# Patient Record
Sex: Female | Born: 1990 | Race: White | Hispanic: No | Marital: Single | Smoking: Never smoker
Health system: Southern US, Community
[De-identification: ages and names within clinical notes are randomized; demographics above are authoritative.]

## PROBLEM LIST (undated history)

## (undated) DIAGNOSIS — M1990 Unspecified osteoarthritis, unspecified site: Secondary | ICD-10-CM

## (undated) DIAGNOSIS — N809 Endometriosis, unspecified: Secondary | ICD-10-CM

## (undated) DIAGNOSIS — T7840XA Allergy, unspecified, initial encounter: Secondary | ICD-10-CM

## (undated) DIAGNOSIS — F32A Depression, unspecified: Secondary | ICD-10-CM

## (undated) DIAGNOSIS — J45909 Unspecified asthma, uncomplicated: Secondary | ICD-10-CM

## (undated) DIAGNOSIS — F419 Anxiety disorder, unspecified: Secondary | ICD-10-CM

## (undated) HISTORY — DX: Allergy, unspecified, initial encounter: T78.40XA

## (undated) HISTORY — DX: Depression, unspecified: F32.A

## (undated) HISTORY — DX: Endometriosis, unspecified: N80.9

## (undated) HISTORY — PX: CHOLECYSTECTOMY: SHX55

## (undated) HISTORY — PX: LAPAROSCOPY: SHX197

## (undated) HISTORY — DX: Unspecified asthma, uncomplicated: J45.909

## (undated) HISTORY — PX: TYMPANOSTOMY TUBE PLACEMENT: SHX32

## (undated) HISTORY — PX: TONSILLECTOMY AND ADENOIDECTOMY: SUR1326

## (undated) HISTORY — DX: Anxiety disorder, unspecified: F41.9

## (undated) HISTORY — DX: Unspecified osteoarthritis, unspecified site: M19.90

## (undated) HISTORY — PX: ABDOMINAL HYSTERECTOMY: SHX81

---

## 2020-01-20 ENCOUNTER — Telehealth: Payer: Self-pay

## 2020-01-20 NOTE — Telephone Encounter (Signed)
I can see her for an acute prior to her establish care appt if needed

## 2020-01-20 NOTE — Telephone Encounter (Signed)
Pt said she just moved from [place] last wk; pt has new pt appt with Tracy Hurry NP on 02/25/20. Pt is not established with OB GYN yet. Pt said since 12/23/19 pt started her menstrual cycle and has been bleeding since then; pt said now bleeding heavily and bleeds thru the absorbant pads very quickly. Pt said bleeding is bright red and passing clots also. Has lower abd pain and cramping below belly button and lower back hurts.  No dizziness, H/A and does not feel like will pass out but pt said she has to see someone. Pt said she is not on any med right now for endometriosis.  I advised pt to go to [facility] ED and pt is concerned about wait time so advised to go to [facility] walk in which is right beside [facility] ED entrance for eval and testing. Pt voiced understanding and will go to either [facility] or [facility] ED. Pt was appreciative and will keep new pt appt 02/25/20. FYI to Tracy Hurry NP.

## 2020-01-25 ENCOUNTER — Other Ambulatory Visit: Payer: Self-pay

## 2020-01-25 ENCOUNTER — Ambulatory Visit: Payer: 59 | Admitting: Internal Medicine

## 2020-01-25 ENCOUNTER — Encounter: Payer: Self-pay | Admitting: Internal Medicine

## 2020-01-25 VITALS — BP 122/84 | HR 82 | Temp 97.1°F | Wt 282.0 lb

## 2020-01-25 DIAGNOSIS — N920 Excessive and frequent menstruation with regular cycle: Secondary | ICD-10-CM

## 2020-01-25 LAB — FERRITIN: Ferritin: 20 ng/mL (ref 10.0–291.0)

## 2020-01-25 LAB — CBC
HCT: 40.1 % (ref 36.0–46.0)
Hemoglobin: 13.2 g/dL (ref 12.0–15.0)
MCHC: 32.8 g/dL (ref 30.0–36.0)
MCV: 83.9 fl (ref 78.0–100.0)
Platelets: 350 10*3/uL (ref 150.0–400.0)
RBC: 4.78 Mil/uL (ref 3.87–5.11)
RDW: 14.1 % (ref 11.5–15.5)
WBC: 9.6 10*3/uL (ref 4.0–10.5)

## 2020-01-25 LAB — IBC PANEL
Iron: 38 ug/dL — ABNORMAL LOW (ref 42–145)
Saturation Ratios: 10.3 % — ABNORMAL LOW (ref 20.0–50.0)
Transferrin: 263 mg/dL (ref 212.0–360.0)

## 2020-01-25 LAB — HEMOGLOBIN A1C: Hgb A1c MFr Bld: 5.4 % (ref 4.6–6.5)

## 2020-01-25 LAB — TSH: TSH: 4.24 u[IU]/mL (ref 0.35–4.50)

## 2020-01-25 NOTE — Patient Instructions (Signed)
Menorrhagia Menorrhagia is when your menstrual periods are heavy or last longer than normal. Follow these instructions at home: Medicines   Take over-the-counter and prescription medicines exactly as told by your doctor. This includes iron pills.  Do not change or switch medicines without asking your doctor.  Do not take aspirin or medicines that contain aspirin 1 week before or during your period. Aspirin may make bleeding worse. General instructions  If you need to change your pad or tampon more than once every 2 hours, limit your activity until the bleeding stops.  Iron pills can cause problems when pooping (constipation). To prevent or treat pooping problems while taking prescription iron pills, your doctor may suggest that you: ? Drink enough fluid to keep your pee (urine) clear or pale yellow. ? Take over-the-counter or prescription medicines. ? Eat foods that are high in fiber. These foods include:  Fresh fruits and vegetables.  Whole grains.  Beans. ? Limit foods that are high in fat and processed sugars. This includes fried and sweet foods.  Eat healthy meals and foods that are high in iron. Foods that have a lot of iron include: ? Leafy green vegetables. ? Meat. ? Liver. ? Eggs. ? Whole grain breads and cereals.  Do not try to lose weight until your heavy bleeding has stopped and you have normal amounts of iron in your blood. If you need to lose weight, work with your doctor.  Keep all follow-up visits as told by your doctor. This is important. Contact a doctor if:  You soak through a pad or tampon every 1 or 2 hours, and this happens every time you have a period.  You need to use pads and tampons at the same time because you are bleeding so much.  You are taking medicine and you: ? Feel sick to your stomach (nauseous). ? Throw up (vomit). ? Have watery poop (diarrhea).  You have other problems that may be related to the medicine you are taking. Get help  right away if:  You soak through more than a pad or tampon in 1 hour.  You pass clots bigger than 1 inch (2.5 cm) wide.  You feel short of breath.  You feel like your heart is beating too fast.  You feel dizzy or you pass out (faint).  You feel very weak or tired. Summary  Menorrhagia is when your menstrual periods are heavy or last longer than normal.  Take over-the-counter and prescription medicines exactly as told by your doctor. This includes iron pills.  Contact a doctor if you soak through more than a pad or tampon in 1 hour or are passing large clots. This information is not intended to replace advice given to you by your health care provider. Make sure you discuss any questions you have with your health care provider. Document Revised: 04/17/2017 Document Reviewed: 01/30/2016 Elsevier Patient Education  2020 Elsevier Inc.  

## 2020-01-25 NOTE — Telephone Encounter (Signed)
Called patient and scheduled acute visit for 1130 on 01/25/20
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
## 2020-01-27 ENCOUNTER — Encounter: Payer: Self-pay | Admitting: Internal Medicine

## 2020-01-27 MED ORDER — DIAZEPAM 10 MG PO TABS: ORAL_TABLET | ORAL | 0 refills | Status: DC

## 2020-01-27 NOTE — Telephone Encounter (Signed)
Patient is scheduled for Ultrasound on 02/01/20. Patient is aware of everything. I advised patient that I would let Nicki Reaper know and that patient will get mchart message from [facility] in regards to the medication to help and any directions for this.

## 2020-02-01 ENCOUNTER — Other Ambulatory Visit: Payer: Self-pay | Admitting: Internal Medicine

## 2020-02-01 ENCOUNTER — Ambulatory Visit
Admission: RE | Admit: 2020-02-01 | Discharge: 2020-02-01 | Disposition: A | Discharge status: Home / Self Care | Payer: 59 | Source: Ambulatory Visit | Attending: Internal Medicine | Admitting: Internal Medicine

## 2020-02-01 ENCOUNTER — Other Ambulatory Visit: Payer: Self-pay

## 2020-02-01 ENCOUNTER — Telehealth: Payer: Self-pay

## 2020-02-01 DIAGNOSIS — N920 Excessive and frequent menstruation with regular cycle: Secondary | ICD-10-CM | POA: Insufficient documentation

## 2020-02-01 NOTE — Telephone Encounter (Signed)
Tracy Medina from [facility] [name] department wanted to let us know that they attempted to do transvaginal US but were not able to. Patient was very anxious before any of the exam began, was shaking. Patient's mom was there but they could not get that portion done. She was able to get the transabdominal images and tried to get as good of a look as possible but just could not get anything else done. Just wanted to give Rene Kocher a heads up on this.

## 2020-02-02 ENCOUNTER — Encounter: Payer: Self-pay | Admitting: Internal Medicine

## 2020-02-02 NOTE — Telephone Encounter (Signed)
Noted, I was aware that this was a possibility.  We will review ultrasound interpretation once received.

## 2020-02-04 ENCOUNTER — Other Ambulatory Visit: Payer: Self-pay | Admitting: Internal Medicine

## 2020-02-04 MED ORDER — NORETHIN-ETH ESTRAD-FE BIPHAS 1 MG-10 MCG / 10 MCG PO TABS: 1.0000 | ORAL_TABLET | Freq: Every day | ORAL | 11 refills | Status: DC

## 2020-02-04 NOTE — Progress Notes (Signed)
Sent in Lo Loestrin FE. Start on the Sunday after her next period starts. Update me in 3 months and let me know how the bleeding is.

## 2020-02-25 ENCOUNTER — Ambulatory Visit (INDEPENDENT_AMBULATORY_CARE_PROVIDER_SITE_OTHER): Payer: 59 | Admitting: Internal Medicine

## 2020-02-25 ENCOUNTER — Encounter: Payer: Self-pay | Admitting: Internal Medicine

## 2020-02-25 ENCOUNTER — Other Ambulatory Visit: Payer: Self-pay

## 2020-02-25 DIAGNOSIS — J4520 Mild intermittent asthma, uncomplicated: Secondary | ICD-10-CM

## 2020-02-25 DIAGNOSIS — F32A Depression, unspecified: Secondary | ICD-10-CM

## 2020-02-25 DIAGNOSIS — F419 Anxiety disorder, unspecified: Secondary | ICD-10-CM | POA: Diagnosis not present

## 2020-02-25 DIAGNOSIS — M47816 Spondylosis without myelopathy or radiculopathy, lumbar region: Secondary | ICD-10-CM

## 2020-02-25 DIAGNOSIS — N921 Excessive and frequent menstruation with irregular cycle: Secondary | ICD-10-CM | POA: Diagnosis not present

## 2020-02-25 DIAGNOSIS — E611 Iron deficiency: Secondary | ICD-10-CM

## 2020-02-25 DIAGNOSIS — N809 Endometriosis, unspecified: Secondary | ICD-10-CM

## 2020-02-25 MED ORDER — TRAMADOL HCL 50 MG PO TABS: 50.0000 mg | ORAL_TABLET | Freq: Every day | ORAL | 0 refills | Status: AC | PRN

## 2020-02-25 MED ORDER — HYDROXYZINE HCL 25 MG PO TABS: 25.0000 mg | ORAL_TABLET | Freq: Every day | ORAL | 0 refills | Status: DC | PRN

## 2020-02-25 MED ORDER — FLUOXETINE HCL 10 MG PO CAPS: 10.0000 mg | ORAL_CAPSULE | Freq: Every day | ORAL | 2 refills | Status: DC
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
## 2020-02-29 ENCOUNTER — Encounter: Payer: Self-pay | Admitting: Internal Medicine

## 2020-02-29 DIAGNOSIS — N920 Excessive and frequent menstruation with regular cycle: Secondary | ICD-10-CM | POA: Insufficient documentation

## 2020-02-29 DIAGNOSIS — J45909 Unspecified asthma, uncomplicated: Secondary | ICD-10-CM | POA: Insufficient documentation

## 2020-02-29 DIAGNOSIS — M479 Spondylosis, unspecified: Secondary | ICD-10-CM | POA: Insufficient documentation

## 2020-02-29 DIAGNOSIS — F419 Anxiety disorder, unspecified: Secondary | ICD-10-CM | POA: Insufficient documentation

## 2020-02-29 DIAGNOSIS — N809 Endometriosis, unspecified: Secondary | ICD-10-CM | POA: Insufficient documentation

## 2020-02-29 DIAGNOSIS — E611 Iron deficiency: Secondary | ICD-10-CM | POA: Insufficient documentation

## 2020-02-29 DIAGNOSIS — F32A Depression, unspecified: Secondary | ICD-10-CM | POA: Insufficient documentation

## 2020-02-29 NOTE — Assessment & Plan Note (Signed)
We will trial oral contraception x3 months to see if any improvement in symptoms

## 2020-02-29 NOTE — Patient Instructions (Signed)

## 2020-02-29 NOTE — Assessment & Plan Note (Signed)
Encouraged core strengthening and stretching daily Encouraged exercise for weight loss

## 2020-02-29 NOTE — Assessment & Plan Note (Signed)
Secondary to menorrhagia We will trial 3 months of oral contraception If no improvement consider adding oral iron

## 2020-02-29 NOTE — Assessment & Plan Note (Signed)
Will see if there is any improvement with OCP's RX for Tramadol 50 mg daily prn as needed for severe pain

## 2020-02-29 NOTE — Assessment & Plan Note (Signed)
Persistent issues Rx for Fluoxetine 10 mg daily Rx for Hydroxyzine 25 mg daily as needed as needed for breakthrough anxiety Encouraged her to consider seeing a therapist for cognitive behavioral therapy Support offered  Update me in 1 month and let me know how you are doing

## 2020-02-29 NOTE — Assessment & Plan Note (Signed)
continue Albuterol and Nebulizer treatments as needed

## 2020-03-16 ENCOUNTER — Encounter: Payer: Self-pay | Admitting: Internal Medicine

## 2020-03-16 DIAGNOSIS — N809 Endometriosis, unspecified: Secondary | ICD-10-CM

## 2020-03-16 DIAGNOSIS — N921 Excessive and frequent menstruation with irregular cycle: Secondary | ICD-10-CM

## 2020-03-17 NOTE — Telephone Encounter (Signed)
Baity out of the office...Marland Kitchen please advise

## 2020-03-31 ENCOUNTER — Ambulatory Visit: Payer: 59 | Admitting: Obstetrics and Gynecology

## 2020-04-02 ENCOUNTER — Emergency Department
Admission: RE | Admit: 2020-04-02 | Discharge: 2020-04-02 | Disposition: A | Discharge status: Home / Self Care | Payer: 59 | Source: Ambulatory Visit | Attending: Family Medicine | Admitting: Family Medicine

## 2020-04-02 ENCOUNTER — Other Ambulatory Visit: Payer: Self-pay

## 2020-04-02 ENCOUNTER — Telehealth: Payer: Self-pay | Admitting: *Deleted

## 2020-04-02 VITALS — BP 114/74 | HR 98 | Temp 98.3°F | Resp 20 | Ht 67.0 in | Wt 255.0 lb

## 2020-04-02 DIAGNOSIS — J4521 Mild intermittent asthma with (acute) exacerbation: Secondary | ICD-10-CM

## 2020-04-02 DIAGNOSIS — J069 Acute upper respiratory infection, unspecified: Secondary | ICD-10-CM

## 2020-04-02 MED ORDER — METHYLPREDNISOLONE SODIUM SUCC 125 MG IJ SOLR
80.0000 mg | Freq: Once | INTRAMUSCULAR | Status: AC
  Administered 2020-04-02: 80 mg via INTRAMUSCULAR

## 2020-04-02 MED ORDER — PREDNISONE 20 MG PO TABS: 20.0000 mg | ORAL_TABLET | Freq: Two times a day (BID) | ORAL | 0 refills | Status: DC

## 2020-04-02 MED ORDER — BENZONATATE 200 MG PO CAPS: 200.0000 mg | ORAL_CAPSULE | Freq: Two times a day (BID) | ORAL | 0 refills | Status: DC | PRN

## 2020-04-02 MED ORDER — DM-GUAIFENESIN ER 30-600 MG PO TB12: 1.0000 | ORAL_TABLET | Freq: Two times a day (BID) | ORAL | 0 refills | Status: DC

## 2020-04-02 NOTE — ED Provider Notes (Signed)
[facility]    CSN: [id number] Arrival date & time: 04/02/20  1334      History   Chief Complaint Chief Complaint  Patient presents with  . Cough  . Sore Throat    HPI Tracy Medina is a 30 y.o. female.   HPI   Patient is here with headache cough shortness sore throat and nasal congestion since yesterday.  She states she has asthma.  She is starting to become quite short of breath.  Her asthma is not well controlled with her inhaler.  She feels like she needs a prednisone shot.  She does have a nebulizer but her fluid has expired.  She has been vaccinated against COVID.  She states that she did take a Covid test today that was negative  Past Medical History:  Diagnosis Date  . Anxiety   . Arthritis   . Asthma   . Depression   . Endometriosis     Patient Active Problem List   Diagnosis Date Noted  . Endometriosis 02/29/2020  . Menorrhagia 02/29/2020  . Iron deficiency 02/29/2020  . Anxiety and depression 02/29/2020  . Asthma 02/29/2020  . OA (osteoarthritis of the spine) 02/29/2020    Past Surgical History:  Procedure Laterality Date  . CHOLECYSTECTOMY    . LAPAROSCOPY    . TONSILLECTOMY AND ADENOIDECTOMY      OB History   No obstetric history on file.      Home Medications    Prior to Admission medications   Medication Sig Start Date End Date Taking? Authorizing Provider  albuterol (VENTOLIN HFA) 108 (90 Base) MCG/ACT inhaler Inhale 2 puffs into the lungs every 4 (four) hours as needed for wheezing or shortness of breath.   Yes [provider]  benzonatate (TESSALON) 200 MG capsule Take 1 capsule (200 mg total) by mouth 2 (two) times daily as needed for cough. 04/02/20  Yes Eustace Moore, MD  dextromethorphan-guaiFENesin [facility] DM) 30-600 MG 12hr tablet Take 1 tablet by mouth 2 (two) times daily. 04/02/20  Yes Eustace Moore, MD  predniSONE (DELTASONE) 20 MG tablet Take 1 tablet (20 mg total) by mouth 2 (two) times daily  with a meal. 04/02/20  Yes Eustace Moore, MD  FLUoxetine (PROZAC) 10 MG capsule Take 1 capsule (10 mg total) by mouth daily. 02/25/20   Lorre Munroe, NP  hydrOXYzine (ATARAX/VISTARIL) 25 MG tablet Take 1 tablet (25 mg total) by mouth daily as needed. 02/25/20   Lorre Munroe, NP  Norethindrone-Ethinyl Estradiol-Fe Biphas (LO LOESTRIN FE) 1 MG-10 MCG / 10 MCG tablet Take 1 tablet by mouth daily. 02/04/20   Lorre Munroe, NP    Family History Family History  Adopted: Yes  Problem Relation Age of Onset  . Hypertension Mother     Social History Social History   Tobacco Use  . Smoking status: Never Smoker  . Smokeless tobacco: Never Used  Substance Use Topics  . Alcohol use: Yes    Comment: social  . Drug use: Never     Allergies   Ceclor [cefaclor]   Review of Systems Review of Systems See HPI  Physical Exam Triage Vital Signs ED Triage Vitals  Enc Vitals Group     BP 04/02/20 1430 114/74     Pulse Rate 04/02/20 1430 98     Resp 04/02/20 1430 20     Temp 04/02/20 1430 98.3 F (36.8 C)     Temp Source 04/02/20 1430 Oral  SpO2 04/02/20 1430 98 %     Weight 04/02/20 1424 255 lb (115.7 kg)     Height 04/02/20 1424 5\' 7"  (1.702 m)     Head Circumference --      Peak Flow --      Pain Score 04/02/20 1424 5     Pain Loc --      Pain Edu? --      Excl. in GC? --    No data found.  Updated Vital Signs BP 114/74 (BP Location: Right Arm)   Pulse 98   Temp 98.3 F (36.8 C) (Oral)   Resp 20   Ht 5\' 7"  (1.702 m)   Wt 115.7 kg   LMP 03/22/2020   SpO2 98%   BMI 39.94 kg/m      Physical Exam Constitutional:      General: She is not in acute distress.    Appearance: She is well-developed. She is obese.  HENT:     Head: Normocephalic and atraumatic.     Right Ear: Tympanic membrane and ear canal normal.     Left Ear: Tympanic membrane and ear canal normal.     Nose: Congestion present.     Mouth/Throat:     Mouth: Mucous membranes are moist.      Tonsils: No tonsillar exudate or tonsillar abscesses.  Eyes:     Conjunctiva/sclera: Conjunctivae normal.     Pupils: Pupils are equal, round, and reactive to light.  Cardiovascular:     Rate and Rhythm: Normal rate and regular rhythm.     Heart sounds: Normal heart sounds.  Pulmonary:     Effort: Pulmonary effort is normal. No respiratory distress.     Breath sounds: Wheezing and rhonchi present.     Comments: Scattered inspiratory wheezes and rhonchi throughout lungs Abdominal:     General: There is no distension.     Palpations: Abdomen is soft.  Musculoskeletal:        General: Normal range of motion.     Cervical back: Normal range of motion.  Lymphadenopathy:     Cervical: No cervical adenopathy.  Skin:    General: Skin is warm and dry.  Neurological:     Mental Status: She is alert.  Psychiatric:        Behavior: Behavior normal.      UC Treatments / Results  Labs (all labs ordered are listed, but only abnormal results are displayed) Labs Reviewed - No data to display  EKG   Radiology No results found.  Procedures Procedures (including critical care time)  Medications Ordered in UC Medications  methylPREDNISolone sodium succinate (SOLU-MEDROL) 125 mg/2 mL injection 80 mg (80 mg Intramuscular Given 04/02/20 1516)    Initial Impression / Assessment and Plan / UC Course  I have reviewed the triage vital signs and the nursing notes.  Pertinent labs & imaging results that were available during my care of the patient were reviewed by me and considered in my medical decision making (see chart for details).     Viral upper respiratory infection with intermittent asthma.  Home care discussed Final Clinical Impressions(s) / UC Diagnoses   Final diagnoses:  Viral upper respiratory tract infection  Mild intermittent asthma with acute exacerbation     Discharge Instructions     Drink plenty of fluids Take Tessalon 2 times a day In addition take Mucinex DM  2 times a day Use the humidifier when able Take prednisone 2 times a day for 5 days  Continue inhalers as needed Return if you fail to improve   ED Prescriptions    Medication Sig Dispense Auth. Provider   predniSONE (DELTASONE) 20 MG tablet Take 1 tablet (20 mg total) by mouth 2 (two) times daily with a meal. 10 tablet Eustace Moore, MD   benzonatate (TESSALON) 200 MG capsule Take 1 capsule (200 mg total) by mouth 2 (two) times daily as needed for cough. 20 capsule Eustace Moore, MD   dextromethorphan-guaiFENesin [facility] DM) 30-600 MG 12hr tablet Take 1 tablet by mouth 2 (two) times daily. 20 tablet Eustace Moore, MD     PDMP not reviewed this encounter.   Eustace Moore, MD 04/02/20 [phone]

## 2020-04-02 NOTE — Telephone Encounter (Signed)
Pt has asthma, having difficulty with breathing while she is sick. Inhaler is expired & not as effective right now. Has a cough, wheezing, & difficulty breathing. Also has a runny nose. No fever. Sx's started about 24 hours ago.  I have advised pt to go to Urgent Care or ED. I also told her I would call her back once I had spoken with the on call provider.

## 2020-04-02 NOTE — Telephone Encounter (Signed)
Pt notified & agreeable to go to UC. She has already made an appt for the [facility] location.

## 2020-04-02 NOTE — Discharge Instructions (Addendum)
Drink plenty of fluids Take Tessalon 2 times a day In addition take Mucinex DM 2 times a day Use the humidifier when able Take prednisone 2 times a day for 5 days Continue inhalers as needed Return if you fail to improve

## 2020-04-02 NOTE — ED Triage Notes (Addendum)
Pt presents to Urgent Care with c/o cough, sore throat, and nasal congestion x 1 day. Pt reports she has asthma and her albuterol nebulizer solution has expired. She denies known COVID exposure and reports a negative home test today; has been vaccinated.

## 2020-04-02 NOTE — Telephone Encounter (Signed)
I think the patient should go to urgent care for an in person evaluation with those symptoms.

## 2020-04-04 ENCOUNTER — Telehealth: Payer: Self-pay

## 2020-04-04 NOTE — Telephone Encounter (Signed)
[facility] Night - Client TELEPHONE ADVICE RECORD AccessNurse Patient Name: Tracy Medina Gender: Female DOB: 1990-08-27 Age: 30 Y 11 M 15 D Return Phone Number: [phone] (Primary) Address: City/State/Zip: [place] [address] Client [facility] Night - Client Client Site [facility] - Night Physician Nicki Reaper - NP Contact Type Call Who Is Calling Patient / Member / Family / Caregiver Call Type Triage / Clinical Relationship To Patient Self Return Phone Number [phone] (Primary) Chief Complaint WHEEZING Reason for Call Symptomatic / Request for Health Information Initial Comment Caller (calling on behalf of her dtr) states her dtr has a really bad cold, she has a bad cough and wheezing. She is requesting albuterol refill for her nebulizer. Her inhaler is not working. Translation No Nurse Assessment Nurse: Noelle Penner, RN, [place] Derry Date/Time (Eastern Time): 04/02/2020 10:20:30 AM Confirm and document reason for call. If symptomatic, describe symptoms. ---Caller states SOB, wheezing and coughing. Wants Neb treatment rx. Using inhaler now. Does the patient have any new or worsening symptoms? ---Yes Will a triage be completed? ---Yes Related visit to physician within the last 2 weeks? ---No Does the PT have any chronic conditions? (i.e. diabetes, asthma, this includes High risk factors for pregnancy, etc.) ---Yes List chronic conditions. ---Asthma Is the patient pregnant or possibly pregnant? (Ask all females between the ages of 8-55) ---No Is this a behavioral health or substance abuse call? ---No Guidelines Guideline Title Affirmed Question Affirmed Notes Nurse Date/Time (Eastern Time) Asthma Attack [1] MODERATE asthma attack (e.g., SOB at rest, speaks in phrases, audible wheezes) AND [2] not resolved after 2 inhaler or nebulizer treatments given 20 minutes apart Noelle Penner, RN, [place] Derry 04/02/2020  10:21:37 AM Disp. Time Lamount Cohen Time) Disposition Final User 04/02/2020 10:17:55 AM Send to Urgent Queue Sivels, Lexis PLEASE NOTE: All timestamps contained within this report are represented as [place] Standard Time. CONFIDENTIALTY NOTICE: This fax transmission is intended only for the addressee. It contains information that is legally privileged, confidential or otherwise protected from use or disclosure. If you are not the intended recipient, you are strictly prohibited from reviewing, disclosing, copying using or disseminating any of this information or taking any action in reliance on or regarding this information. If you have received this fax in error, please notify us immediately by telephone so that we can arrange for its return to [name]. Phone: [phone], Toll-Free: [phone], Fax: [phone] Page: 2 of 2 Call Id: [id number] 04/02/2020 10:26:50 AM Go to ED Now (or PCP triage) Radonna Ricker, RN, [place] Derry Caller Disagree/Comply Comply Caller Understands Yes PreDisposition InappropriateToAsk Care Advice Given Per Guideline GO TO ED NOW (OR PCP TRIAGE): ASTHMA ATTACK - TREATMENT - QUICK-RELIEF MEDICINE: CARE ADVICE given per Asthma Attack (Adult) guideline. Referrals [facility]

## 2020-04-04 NOTE — Telephone Encounter (Signed)
Patient was seen at [facility] on 3/12. Please refer to note.

## 2020-04-04 NOTE — Telephone Encounter (Signed)
noted 

## 2020-04-06 ENCOUNTER — Encounter: Payer: Self-pay | Admitting: Internal Medicine

## 2020-04-06 DIAGNOSIS — Z9109 Other allergy status, other than to drugs and biological substances: Secondary | ICD-10-CM

## 2020-04-07 NOTE — Telephone Encounter (Signed)
pts mom calling because if pt tries to talk has coughing episodes. Pt seen [facility] in [place] on 04/02/20; pt received steroid shot which helped but after 04/04/20 no more benefit from steroid shot; pt finished the prednisone yesterday; pt taking mucinex,sudafed, tessalon perles. Pt feels miserable and has SOB with difficulty catching breath after coughing episodes, very runny nose where pt has to blow nose all the time; pt has consistent sharpe pain in both breast and S/T. Pt is not running fever. Pt has dry cough mostly but occasional prod cough and after coughing it is hard for pt to catch breath. pts mom said will take pt to UC in [place] now. Sending note to Pamala Hurry NP.

## 2020-04-08 ENCOUNTER — Telehealth: Payer: Self-pay | Admitting: Obstetrics and Gynecology

## 2020-04-08 ENCOUNTER — Encounter: Payer: Self-pay | Admitting: Obstetrics and Gynecology

## 2020-04-08 ENCOUNTER — Other Ambulatory Visit: Payer: Self-pay

## 2020-04-08 ENCOUNTER — Ambulatory Visit (INDEPENDENT_AMBULATORY_CARE_PROVIDER_SITE_OTHER): Payer: 59 | Admitting: Obstetrics and Gynecology

## 2020-04-08 VITALS — BP 128/80 | Ht 66.5 in | Wt 276.0 lb

## 2020-04-08 VITALS — BP 130/80 | Ht 66.5 in | Wt 276.0 lb

## 2020-04-08 DIAGNOSIS — Z30017 Encounter for initial prescription of implantable subdermal contraceptive: Secondary | ICD-10-CM

## 2020-04-08 DIAGNOSIS — N939 Abnormal uterine and vaginal bleeding, unspecified: Secondary | ICD-10-CM | POA: Diagnosis not present

## 2020-04-08 DIAGNOSIS — N809 Endometriosis, unspecified: Secondary | ICD-10-CM

## 2020-04-08 LAB — POCT URINE PREGNANCY: Preg Test, Ur: NEGATIVE

## 2020-04-08 MED ORDER — ALBUTEROL SULFATE HFA 108 (90 BASE) MCG/ACT IN AERS: 2.0000 | INHALATION_SPRAY | Freq: Four times a day (QID) | RESPIRATORY_TRACT | 0 refills | Status: DC | PRN

## 2020-04-08 MED ORDER — FLUTICASONE PROPIONATE HFA 44 MCG/ACT IN AERO
1.0000 | INHALATION_SPRAY | Freq: Two times a day (BID) | RESPIRATORY_TRACT | 0 refills | Status: DC
Start: 2020-04-08 — End: 2020-06-24

## 2020-04-08 NOTE — Progress Notes (Signed)
  Nexplanon Insertion  Patient given informed consent, signed copy in the chart, time out was performed. Pregnancy test was negative. Appropriate time out taken.  Patient's left arm was prepped and draped in the usual sterile fashion.. The ruler used to measure and mark insertion area.  Pt was prepped with betadine swab and then injected with 3 cc of 2% lidocaine with epinephrine. Nexplanon removed form packaging,  Device confirmed in needle, then inserted full length of needle and withdrawn per handbook instructions.  Pt insertion site covered with steri-strip and a bandage.   Minimal blood loss.  Pt tolerated the procedure well.   Adelene Idler MD, [facility], [facility] 04/08/2020 3:42 PM
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
## 2020-04-08 NOTE — Telephone Encounter (Signed)
Patient coming back at 3:10 for nexplanon insertion with CRS

## 2020-04-11 NOTE — Telephone Encounter (Signed)
Noted. Nexplanon rcvd/charged 04/08/20

## 2020-04-16 ENCOUNTER — Other Ambulatory Visit: Payer: Self-pay | Admitting: Internal Medicine

## 2020-04-18 ENCOUNTER — Other Ambulatory Visit: Payer: Self-pay | Admitting: Internal Medicine

## 2020-04-23 MED ORDER — LEVALBUTEROL HCL 0.31 MG/3ML IN NEBU: 1.0000 | INHALATION_SOLUTION | RESPIRATORY_TRACT | 1 refills | Status: DC | PRN

## 2020-04-23 MED ORDER — FLUOXETINE HCL 20 MG PO TABS
20.0000 mg | ORAL_TABLET | Freq: Every day | ORAL | 2 refills | Status: DC
Start: 2020-04-23 — End: 2020-05-16

## 2020-04-23 NOTE — Addendum Note (Signed)
Addended by: Lorre Munroe on: 04/23/2020 08:43 AM   Modules accepted: Orders

## 2020-05-15 ENCOUNTER — Other Ambulatory Visit: Payer: Self-pay | Admitting: Internal Medicine

## 2020-05-15 DIAGNOSIS — Z9109 Other allergy status, other than to drugs and biological substances: Secondary | ICD-10-CM

## 2020-05-16 NOTE — Telephone Encounter (Signed)
Has upcoming appt with Rene Kocher at [facility]

## 2020-05-24 ENCOUNTER — Encounter: Payer: Self-pay | Admitting: Internal Medicine

## 2020-05-24 ENCOUNTER — Ambulatory Visit: Payer: 59 | Admitting: Internal Medicine

## 2020-05-24 ENCOUNTER — Ambulatory Visit (INDEPENDENT_AMBULATORY_CARE_PROVIDER_SITE_OTHER): Payer: 59 | Admitting: Internal Medicine

## 2020-05-24 ENCOUNTER — Other Ambulatory Visit: Payer: Self-pay

## 2020-05-24 VITALS — BP 133/78 | HR 84 | Temp 97.3°F | Resp 18 | Ht 66.5 in | Wt 269.6 lb

## 2020-05-24 DIAGNOSIS — Z6841 Body Mass Index (BMI) 40.0 and over, adult: Secondary | ICD-10-CM

## 2020-05-24 DIAGNOSIS — Z23 Encounter for immunization: Secondary | ICD-10-CM | POA: Diagnosis not present

## 2020-05-24 DIAGNOSIS — W57XXXA Bitten or stung by nonvenomous insect and other nonvenomous arthropods, initial encounter: Secondary | ICD-10-CM

## 2020-05-24 DIAGNOSIS — F5104 Psychophysiologic insomnia: Secondary | ICD-10-CM

## 2020-05-24 DIAGNOSIS — Z0001 Encounter for general adult medical examination with abnormal findings: Secondary | ICD-10-CM

## 2020-05-24 DIAGNOSIS — G4700 Insomnia, unspecified: Secondary | ICD-10-CM | POA: Insufficient documentation

## 2020-05-24 DIAGNOSIS — Z1159 Encounter for screening for other viral diseases: Secondary | ICD-10-CM | POA: Diagnosis not present

## 2020-05-24 DIAGNOSIS — E66813 Obesity, class 3: Secondary | ICD-10-CM

## 2020-05-24 DIAGNOSIS — Z114 Encounter for screening for human immunodeficiency virus [HIV]: Secondary | ICD-10-CM

## 2020-05-24 DIAGNOSIS — R030 Elevated blood-pressure reading, without diagnosis of hypertension: Secondary | ICD-10-CM

## 2020-05-24 MED ORDER — TRAZODONE HCL 50 MG PO TABS
25.0000 mg | ORAL_TABLET | Freq: Every evening | ORAL | 0 refills | Status: DC | PRN
Start: 2020-05-24 — End: 2020-06-22

## 2020-05-24 NOTE — Assessment & Plan Note (Signed)
Borderline Reinforced DASH diet and exercise for weight loss 

## 2020-05-24 NOTE — Patient Instructions (Signed)
Health Maintenance, Female Adopting a healthy lifestyle and getting preventive care are important in promoting health and wellness. Ask your health care provider about:  The right schedule for you to have regular tests and exams.  Things you can do on your own to prevent diseases and keep yourself healthy. What should I know about diet, weight, and exercise? Eat a healthy diet  Eat a diet that includes plenty of vegetables, fruits, low-fat dairy products, and lean protein.  Do not eat a lot of foods that are high in solid fats, added sugars, or sodium.   Maintain a healthy weight Body mass index (BMI) is used to identify weight problems. It estimates body fat based on height and weight. Your health care provider can help determine your BMI and help you achieve or maintain a healthy weight. Get regular exercise Get regular exercise. This is one of the most important things you can do for your health. Most adults should:  Exercise for at least 150 minutes each week. The exercise should increase your heart rate and make you sweat (moderate-intensity exercise).  Do strengthening exercises at least twice a week. This is in addition to the moderate-intensity exercise.  Spend less time sitting. Even light physical activity can be beneficial. Watch cholesterol and blood lipids Have your blood tested for lipids and cholesterol at 30 years of age, then have this test every 5 years. Have your cholesterol levels checked more often if:  Your lipid or cholesterol levels are high.  You are older than 30 years of age.  You are at high risk for heart disease. What should I know about cancer screening? Depending on your health history and family history, you may need to have cancer screening at various ages. This may include screening for:  Breast cancer.  Cervical cancer.  Colorectal cancer.  Skin cancer.  Lung cancer. What should I know about heart disease, diabetes, and high blood  pressure? Blood pressure and heart disease  High blood pressure causes heart disease and increases the risk of stroke. This is more likely to develop in people who have high blood pressure readings, are of African descent, or are overweight.  Have your blood pressure checked: ? Every 3-5 years if you are 18-39 years of age. ? Every year if you are 40 years old or older. Diabetes Have regular diabetes screenings. This checks your fasting blood sugar level. Have the screening done:  Once every three years after age 40 if you are at a normal weight and have a low risk for diabetes.  More often and at a younger age if you are overweight or have a high risk for diabetes. What should I know about preventing infection? Hepatitis B If you have a higher risk for hepatitis B, you should be screened for this virus. Talk with your health care provider to find out if you are at risk for hepatitis B infection. Hepatitis C Testing is recommended for:  Everyone born from 1945 through 1965.  Anyone with known risk factors for hepatitis C. Sexually transmitted infections (STIs)  Get screened for STIs, including gonorrhea and chlamydia, if: ? You are sexually active and are younger than 30 years of age. ? You are older than 30 years of age and your health care provider tells you that you are at risk for this type of infection. ? Your sexual activity has changed since you were last screened, and you are at increased risk for chlamydia or gonorrhea. Ask your health care provider   if you are at risk.  Ask your health care provider about whether you are at high risk for HIV. Your health care provider may recommend a prescription medicine to help prevent HIV infection. If you choose to take medicine to prevent HIV, you should first get tested for HIV. You should then be tested every 3 months for as long as you are taking the medicine. Pregnancy  If you are about to stop having your period (premenopausal) and  you may become pregnant, seek counseling before you get pregnant.  Take 400 to 800 micrograms (mcg) of folic acid every day if you become pregnant.  Ask for birth control (contraception) if you want to prevent pregnancy. Osteoporosis and menopause Osteoporosis is a disease in which the bones lose minerals and strength with aging. This can result in bone fractures. If you are 65 years old or older, or if you are at risk for osteoporosis and fractures, ask your health care provider if you should:  Be screened for bone loss.  Take a calcium or vitamin D supplement to lower your risk of fractures.  Be given hormone replacement therapy (HRT) to treat symptoms of menopause. Follow these instructions at home: Lifestyle  Do not use any products that contain nicotine or tobacco, such as cigarettes, e-cigarettes, and chewing tobacco. If you need help quitting, ask your health care provider.  Do not use street drugs.  Do not share needles.  Ask your health care provider for help if you need support or information about quitting drugs. Alcohol use  Do not drink alcohol if: ? Your health care provider tells you not to drink. ? You are pregnant, may be pregnant, or are planning to become pregnant.  If you drink alcohol: ? Limit how much you use to 0-1 drink a day. ? Limit intake if you are breastfeeding.  Be aware of how much alcohol is in your drink. In the [place] one drink equals one 12 oz bottle of beer (355 mL), one 5 oz glass of wine (148 mL), or one 1 oz glass of hard liquor (44 mL). General instructions  Schedule regular health, dental, and eye exams.  Stay current with your vaccines.  Tell your health care provider if: ? You often feel depressed. ? You have ever been abused or do not feel safe at home. Summary  Adopting a healthy lifestyle and getting preventive care are important in promoting health and wellness.  Follow your health care provider's instructions about healthy  diet, exercising, and getting tested or screened for diseases.  Follow your health care provider's instructions on monitoring your cholesterol and blood pressure. This information is not intended to replace advice given to you by your health care provider. Make sure you discuss any questions you have with your health care provider. Document Revised: 01/01/2018 Document Reviewed: 01/01/2018 Elsevier Patient Education  2021 [employer]  

## 2020-05-24 NOTE — Progress Notes (Signed)
Subjective:    Patient ID: Tracy Medina, female    DOB: [birth year], 30 y.o.   MRN: [id number]  HPI  Patient presents the clinic today for her annual exam.  She is concerned about insomnia. She has difficulty falling asleep but is able to stay asleep. She reports the Fluoxetine initially helped her fall asleep but it is not as effective anymore.  She has tried taking Melatonin Gummies and Unisom but these are either not effective or make her too drowsy.  She would like to know if there are other alternatives to help her sleep.  Flu: 12/2019 Tetanus: >10 years COVID: Pfizer x3 Pap smear: 08/2018 Dentist: Annually  Diet: She does eat meat. She consumes fruits and veggies. She does eat fried foods. She drinks mostly Gatorade Zero Exercise: Walking  Review of Systems      Past Medical History:  Diagnosis Date  . Anxiety   . Arthritis   . Asthma   . Depression   . Endometriosis     Current Outpatient Medications  Medication Sig Dispense Refill  . albuterol (VENTOLIN HFA) 108 (90 Base) MCG/ACT inhaler Inhale 2 puffs into the lungs every 6 (six) hours as needed for wheezing or shortness of breath. 8 g 0  . azithromycin (ZITHROMAX) 250 MG tablet Take 250 mg by mouth as directed.    . benzonatate (TESSALON) 200 MG capsule Take 1 capsule (200 mg total) by mouth 2 (two) times daily as needed for cough. 20 capsule 0  . dextromethorphan-guaiFENesin (MUCINEX DM) 30-600 MG 12hr tablet Take 1 tablet by mouth 2 (two) times daily. 20 tablet 0  . etonogestrel (NEXPLANON) 68 MG IMPL implant 1 each by Subdermal route once.    Marland Kitchen FLUoxetine (PROZAC) 20 MG tablet TAKE 1 TABLET BY MOUTH EVERY DAY 90 tablet 0  . fluticasone (FLOVENT HFA) 44 MCG/ACT inhaler Inhale 1 puff into the lungs 2 (two) times daily. 1 each 0  . hydrOXYzine (ATARAX/VISTARIL) 25 MG tablet TAKE 1 TABLET BY MOUTH EVERY DAY AS NEEDED 90 tablet 0  . levalbuterol (XOPENEX) 0.31 MG/3ML nebulizer solution Take 3 mLs (0.31 mg total)  by nebulization every 4 (four) hours as needed for wheezing. 30 mL 1  . Norethindrone-Ethinyl Estradiol-Fe Biphas (LO LOESTRIN FE) 1 MG-10 MCG / 10 MCG tablet Take 1 tablet by mouth daily. 28 tablet 11  . predniSONE (DELTASONE) 20 MG tablet Take 1 tablet (20 mg total) by mouth 2 (two) times daily with a meal. 10 tablet 0  . traMADol (ULTRAM) 50 MG tablet Take by mouth.     No current facility-administered medications for this visit.    Allergies  Allergen Reactions  . Cefaclor Hives and Rash    Family History  Adopted: Yes  Problem Relation Age of Onset  . Hypertension Mother     Social History   Socioeconomic History  . Marital status: Single    Spouse name: Not on file  . Number of children: Not on file  . Years of education: Not on file  . Highest education level: Not on file  Occupational History  . Not on file  Tobacco Use  . Smoking status: Never Smoker  . Smokeless tobacco: Never Used  Vaping Use  . Vaping Use: Never used  Substance and Sexual Activity  . Alcohol use: Yes    Comment: social  . Drug use: Never  . Sexual activity: Not Currently    Birth control/protection: Pill  Other Topics Concern  . Not on file  Social History Narrative  . Not on file   Social Determinants of Health   Financial Resource Strain: Not on file  Food Insecurity: Not on file  Transportation Needs: Not on file  Physical Activity: Not on file  Stress: Not on file  Social Connections: Not on file  Intimate Partner Violence: Not on file     Constitutional: Denies fever, malaise, fatigue, headache or abrupt weight changes.  HEENT: Denies eye pain, eye redness, ear pain, ringing in the ears, wax buildup, runny nose, nasal congestion, bloody nose, or sore throat. Respiratory: Denies difficulty breathing, shortness of breath, cough or sputum production.   Cardiovascular: Denies chest pain, chest tightness, palpitations or swelling in the hands or feet.  Gastrointestinal: Denies  abdominal pain, bloating, constipation, diarrhea or blood in the stool.  GU: Denies urgency, frequency, pain with urination, burning sensation, blood in urine, odor or discharge. Musculoskeletal: Patient reports chronic back pain.  Denies decrease in range of motion, difficulty with gait, muscle pain or joint swelling.  Skin: Patient reports rash of the right wrist and right ankle denies ulcercations.  Neurological: Patient reports insomnia.  Denies dizziness, difficulty with memory, difficulty with speech or problems with balance and coordination.  Psych: Patient has a history of anxiety and depression. Denies SI/HI.  No other specific complaints in a complete review of systems (except as listed in HPI above).  Objective:   Physical Exam  BP 133/78 (BP Location: Right Arm, Patient Position: Sitting, Cuff Size: Large)   Pulse 84   Temp (!) 97.3 F (36.3 C) (Temporal)   Resp 18   Ht 5' 6.5" (1.689 m)   Wt 269 lb 9.6 oz (122.3 kg)   SpO2 98%   BMI 42.86 kg/m   Wt Readings from Last 3 Encounters:  04/08/20 276 lb (125.2 kg)  04/08/20 276 lb (125.2 kg)  04/02/20 255 lb (115.7 kg)    General: Appears her stated age, obese, in NAD. Skin: Warm, dry and intact.  Fleabites noted over right wrist and right ankle, healing noted. HEENT: Head: normal shape and size; Eyes: sclera white and EOMs intact;  Neck:  Neck supple, trachea midline. No masses, lumps or thyromegaly present.  Cardiovascular: Normal rate and rhythm. S1,S2 noted.  No murmur, rubs or gallops noted. No JVD or BLE edema. Pulmonary/Chest: Normal effort and positive vesicular breath sounds. No respiratory distress. No wheezes, rales or ronchi noted.  Abdomen: Soft and nontender. Normal bowel sounds. No distention or masses noted. Liver, spleen and kidneys non palpable. Musculoskeletal: Strength 5/5 BUE/BLE.  No difficulty with gait.  Neurological: Alert and oriented. Cranial nerves II-XII grossly intact. Coordination normal.   Psychiatric: Mood and affect normal.  Mildly anxious appearing.  Judgment and thought content normal.   CBC    Component Value Date/Time   WBC 9.6 01/25/2020 1144   RBC 4.78 01/25/2020 1144   HGB 13.2 01/25/2020 1144   HCT 40.1 01/25/2020 1144   PLT 350.0 01/25/2020 1144   MCV 83.9 01/25/2020 1144   MCHC 32.8 01/25/2020 1144   RDW 14.1 01/25/2020 1144    Hgb A1C Lab Results  Component Value Date   HGBA1C 5.4 01/25/2020           Assessment & Plan:   Preventative health maintenance:  Encouraged her to get flu shot in fall Tdap today COVID UTD, she will attach a copy of her card to my chart Pap smear due 08/2021, follows with GYN Encouraged her to consume a balanced diet and  exercise regimen Advised her to see an eye doctor and dentist annually We will check c-Met, lipid, HIV and hep C today  Flea Bites:  Can use Hydrocortisone OTC as needed  RTC in 1 year, sooner if needed Webb Silversmith, NP This visit occurred during the SARS-CoV-2 public health emergency.  Safety protocols were in place, including screening questions prior to the visit, additional usage of staff PPE, and extensive cleaning of exam room while observing appropriate contact time as indicated for disinfecting solutions.

## 2020-05-24 NOTE — Assessment & Plan Note (Signed)
We will trial Trazodone Advised her not to take this with Unisom or Melatonin Gummies

## 2020-05-25 LAB — LIPID PANEL
Cholesterol: 160 mg/dL (ref <200)
HDL: 48 mg/dL — ABNORMAL LOW (ref >=50)
LDL Cholesterol (Calc): 95 mg/dL (calc)
Non-HDL Cholesterol (Calc): 112 mg/dL (calc) (ref <130)
Total CHOL/HDL Ratio: 3.3 (calc) (ref <5.0)
Triglycerides: 77 mg/dL (ref <150)

## 2020-05-25 LAB — COMPREHENSIVE METABOLIC PANEL
AG Ratio: 1.7 (calc) (ref 1.0–2.5)
ALT: 13 U/L (ref 6–29)
AST: 14 U/L (ref 10–30)
Albumin: 4.5 g/dL (ref 3.6–5.1)
Alkaline phosphatase (APISO): 86 U/L (ref 31–125)
BUN: 9 mg/dL (ref 7–25)
CO2: 24 mmol/L (ref 20–32)
Calcium: 9.8 mg/dL (ref 8.6–10.2)
Chloride: 104 mmol/L (ref 98–110)
Creat: 0.97 mg/dL (ref 0.50–1.10)
Globulin: 2.6 g/dL (calc) (ref 1.9–3.7)
Glucose, Bld: 88 mg/dL (ref 65–99)
Potassium: 4.2 mmol/L (ref 3.5–5.3)
Sodium: 138 mmol/L (ref 135–146)
Total Bilirubin: 0.5 mg/dL (ref 0.2–1.2)
Total Protein: 7.1 g/dL (ref 6.1–8.1)

## 2020-05-25 LAB — HEPATITIS C ANTIBODY
Hepatitis C Ab: NONREACTIVE
SIGNAL TO CUT-OFF: 0 (ref <1.00)

## 2020-05-25 LAB — HIV ANTIBODY (ROUTINE TESTING W REFLEX): HIV 1&2 Ab, 4th Generation: NONREACTIVE

## 2020-06-15 ENCOUNTER — Other Ambulatory Visit: Payer: Self-pay

## 2020-06-17 ENCOUNTER — Encounter: Payer: Self-pay | Admitting: Allergy

## 2020-06-17 ENCOUNTER — Ambulatory Visit (INDEPENDENT_AMBULATORY_CARE_PROVIDER_SITE_OTHER): Payer: 59 | Admitting: Allergy

## 2020-06-17 ENCOUNTER — Other Ambulatory Visit: Payer: Self-pay

## 2020-06-17 ENCOUNTER — Telehealth: Payer: Self-pay

## 2020-06-17 VITALS — BP 122/82 | HR 70 | Temp 98.0°F | Resp 18 | Ht 67.0 in | Wt 271.4 lb

## 2020-06-17 DIAGNOSIS — J3089 Other allergic rhinitis: Secondary | ICD-10-CM

## 2020-06-17 DIAGNOSIS — H1013 Acute atopic conjunctivitis, bilateral: Secondary | ICD-10-CM

## 2020-06-17 DIAGNOSIS — L563 Solar urticaria: Secondary | ICD-10-CM | POA: Diagnosis not present

## 2020-06-17 DIAGNOSIS — J4540 Moderate persistent asthma, uncomplicated: Secondary | ICD-10-CM | POA: Diagnosis not present

## 2020-06-17 MED ORDER — OLOPATADINE HCL 0.2 % OP SOLN: 1.0000 [drp] | Freq: Every day | OPHTHALMIC | 5 refills | Status: DC | PRN

## 2020-06-17 MED ORDER — AZELASTINE HCL 0.1 % NA SOLN: NASAL | 5 refills | Status: DC

## 2020-06-17 MED ORDER — AZELASTINE-FLUTICASONE 137-50 MCG/ACT NA SUSP: 1.0000 | Freq: Two times a day (BID) | NASAL | 5 refills | Status: DC

## 2020-06-17 MED ORDER — FLUTICASONE PROPIONATE 50 MCG/ACT NA SUSP
NASAL | 5 refills | Status: DC
Start: 2020-06-17 — End: 2020-12-22

## 2020-06-17 NOTE — Progress Notes (Signed)
New Patient Note  RE: Tracy Medina MRN: [id number] DOB: April 08, 1990 Date of Office Visit: 06/17/2020  Referring provider: Lorre Munroe, NP Primary care provider: Lorre Munroe, NP  Chief Complaint: asthma and allergies  History of present illness: Tracy Medina is a 30 y.o. female presenting today for consultation for allergic rhinitis and asthma.    She moved to this area in December from [place].  She is noting more allergy symptoms here in [place].  She notes coughing and feels like her throat tightness and throat clearing, nasal congestion and drainage, itchy/watery eyes, sneezing.  Symptoms started this spring.  Has not had any sinus infections since living here.  She will take zyrtec or claritin that usually works but not lately.  Has not used nasal sprays or eyedrops.  She has asthma from childhood.  Her allergies flare her asthma.  She has cough, wheeze, shortness of breath and chest tightness when her asthma flares.  She states these symptoms are a lot worse when she has an respiratory illness.  She had an illness in March where she needed to go ED.    She states nebulizer treatment with levabulterol worked really well for her.   She has Flovent that she uses 1-2 puffs and uses as needed.  She does note have a spacer.  She states moving here she has been in the ED 3-4 times for asthma exacerbation.  She states she is using albuterol or levalbuterol about every other day at this time.   She has an albuterol inhaler. She states her pharmacy did not have albuterol neb solution but she was able to call around and found a pharmacy who could give her albuterol neb solution.  She states when she was younger she would develop a rash on her hands when she is in the sun that was hive-like and itchy.  She states it doesn't happen too often at this time.  She had a biopsy of this when she was younger and states the results were normal.    No history of food allergy.   She  reports developing hives with Ceclor.    Review of systems: Review of Systems  Constitutional: Negative.   HENT:       See HPI  Eyes:       See HPI  Respiratory:       See HPI  Cardiovascular: Negative.   Gastrointestinal: Negative.   Musculoskeletal: Negative.   Skin: Negative.   Neurological: Negative.     All other systems negative unless noted above in HPI  Past medical history: Past Medical History:  Diagnosis Date  . Anxiety   . Arthritis   . Asthma   . Depression   . Endometriosis     Past surgical history: Past Surgical History:  Procedure Laterality Date  . CHOLECYSTECTOMY    . LAPAROSCOPY    . TONSILLECTOMY AND ADENOIDECTOMY    . TYMPANOSTOMY TUBE PLACEMENT      Family history:  Family History  Adopted: Yes  Problem Relation Age of Onset  . Hypertension Mother     Social history: Lives in an apartment without carpeting with electric heating and central cooling.  Dog in the home.  There is no concern for water damage, mildew or roaches in the home.  She does not list an occupation at this time.  She denies a smoking history.  Medication List: Current Outpatient Medications  Medication Sig Dispense Refill  . albuterol (VENTOLIN HFA) 108 (90  Base) MCG/ACT inhaler Inhale 2 puffs into the lungs every 6 (six) hours as needed for wheezing or shortness of breath. 8 g 0  . dextromethorphan-guaiFENesin (MUCINEX DM) 30-600 MG 12hr tablet Take 1 tablet by mouth 2 (two) times daily. 20 tablet 0  . fluticasone (FLOVENT HFA) 44 MCG/ACT inhaler Inhale 1 puff into the lungs 2 (two) times daily. (Patient taking differently: Inhale 1 puff into the lungs 2 (two) times daily as needed.) 1 each 0  . hydrOXYzine (ATARAX/VISTARIL) 25 MG tablet TAKE 1 TABLET BY MOUTH EVERY DAY AS NEEDED 90 tablet 0  . levalbuterol (XOPENEX) 0.31 MG/3ML nebulizer solution Take 3 mLs (0.31 mg total) by nebulization every 4 (four) hours as needed for wheezing. 30 mL 1  . Norethindrone-Ethinyl  Estradiol-Fe Biphas (LO LOESTRIN FE) 1 MG-10 MCG / 10 MCG tablet Take 1 tablet by mouth daily. 28 tablet 11  . traZODone (DESYREL) 50 MG tablet Take 0.5-1 tablets (25-50 mg total) by mouth at bedtime as needed for sleep. 30 tablet 0  . etonogestrel (NEXPLANON) 68 MG IMPL implant 1 each by Subdermal route once.     No current facility-administered medications for this visit.    Known medication allergies: Allergies  Allergen Reactions  . Cefaclor Hives and Rash     Physical examination: Blood pressure 122/82, pulse 70, temperature 98 F (36.7 C), resp. rate 18, height 5\' 7"  (1.702 m), weight 271 lb 6.4 oz (123.1 kg), SpO2 98 %.  General: Alert, interactive, in no acute distress. HEENT: PERRLA, TMs pearly gray, turbinates moderately edematous without discharge, post-pharynx non erythematous. Neck: Supple without lymphadenopathy. Lungs: Clear to auscultation without wheezing, rhonchi or rales. {no increased work of breathing. CV: Normal S1, S2 without murmurs. Abdomen: Nondistended, nontender. Skin: Warm and dry, without lesions or rashes. Extremities:  No clubbing, cyanosis or edema. Neuro:   Grossly intact.  Diagnositics/Labs:  Spirometry: FEV1: 3.12 L 90%, FVC: 3.97L 96%, ratio consistent with  nonobstructive pattern  Allergy testing: Environmental allergy skin prick testing is positive to curvularia.  Intradermal testing is positive to cockroach and dog. Allergy testing results were read and interpreted by provider, documented by clinical staff.   Assessment and plan: Allergic rhinitis with conjunctivitis.  - Environmental allergy testing is positive to mold, cockroach and dog.   This however does not quite explain your seasonal symptoms thus will obtain serum IgE levels see if you have any pollen sensitivity.   -Allergen avoidance measures discussed/handouts provided -Try either Xyzal 5 mg or Allegra 180 mg 1 tablet daily.  These are over-the-counter long-acting  antihistamine options that may work better for symptom control than Allegra or Claritin -trial dymista 1 spray each nostril twice a day.  This is a combination nasal spray with Flonase + Astelin (nasal antihistamine).  This helps with both nasal congestion and drainage.  -For itchy, watery, red eyes use olopatadine 0.1% ointment to each eye daily as needed  Moderate persistent asthma -Use Flovent 44 mcg 1 puff twice daily at this time.  If you are not meeting the below goals then twice a day. -have access to albuterol inhaler 2 puffs or levalbuterol 1 vial via nebulizer every 4-6 hours as needed for cough/wheeze/shortness of breath/chest tightness.  May use 15-20 minutes prior to activity.   Monitor frequency of use.    Asthma control goals:   Full participation in all desired activities (may need albuterol before activity)  Albuterol use two time or less a week on average (not counting use with activity)  Cough interfering with sleep two time or less a month  Oral steroids no more than once a year  No hospitalizations  Solar urticaria -for rash with sun exposure can take additional dose of your antihistamine for itch control -let us know if this becomes more frequent and can recommend a topical option  Follow-up in 4 months or sooner if needed  I appreciate the opportunity to take part in Nikeya's care. Please do not hesitate to contact me with questions.  Sincerely,   Margo Aye, MD Allergy/Immunology [facility]

## 2020-06-17 NOTE — Patient Instructions (Addendum)
-   Environmental allergy testing is positive to mold, cockroach and dog.   This however does not quite explain your seasonal symptoms thus will obtain serum IgE levels see if you have any pollen sensitivity.   -Allergen avoidance measures discussed/handouts provided -Try either Xyzal 5 mg or Allegra 180 mg 1 tablet daily.  These are over-the-counter long-acting antihistamine options that may work better for symptom control than Allegra or Claritin -trial dymista 1 spray each nostril twice a day.  This is a combination nasal spray with Flonase + Astelin (nasal antihistamine).  This helps with both nasal congestion and drainage.  -For itchy, watery, red eyes use olopatadine 0.1% ointment to each eye daily as needed  -Use Flovent 44 mcg 1 puff twice daily at this time.  If you are not meeting the below goals then twice a day. -have access to albuterol inhaler 2 puffs or levalbuterol 1 vial via nebulizer every 4-6 hours as needed for cough/wheeze/shortness of breath/chest tightness.  May use 15-20 minutes prior to activity.   Monitor frequency of use.    Asthma control goals:   Full participation in all desired activities (may need albuterol before activity)  Albuterol use two time or less a week on average (not counting use with activity)  Cough interfering with sleep two time or less a month  Oral steroids no more than once a year  No hospitalizations  -for rash with sun exposure can take additional dose of your antihistamine for itch control -let us know if this becomes more frequent and can recommend a topical option  Follow-up in 4 months or sooner if needed

## 2020-06-17 NOTE — Telephone Encounter (Signed)
Called patient and let her know that her insurance did not want to cover the generic Dymista, so we are going to send in Azelastine ( 2 sprays per nostril twice daily) and Fluticasone ( 2 sprays per nostril once daily) to her pharmacy.  Patient voiced understanding.

## 2020-06-22 ENCOUNTER — Other Ambulatory Visit: Payer: Self-pay

## 2020-06-22 ENCOUNTER — Encounter: Payer: Self-pay | Admitting: Internal Medicine

## 2020-06-22 ENCOUNTER — Telehealth: Payer: Self-pay | Admitting: Allergy

## 2020-06-22 ENCOUNTER — Ambulatory Visit: Payer: Self-pay

## 2020-06-22 MED ORDER — TRAZODONE HCL 50 MG PO TABS: 25.0000 mg | ORAL_TABLET | Freq: Every evening | ORAL | 0 refills | Status: DC | PRN

## 2020-06-22 NOTE — Telephone Encounter (Signed)
Patient was in the ER today with an ear infection, which has been treated. The doctor told her it was due to allergies, but she was only positive to dog, cockroach, and mold when she was tested in our office. Patient states she has not heard back about her lab work she done in office last week, but labs still say active in her chart.  Patient would like to know what to do or if more allergy testing should be done.   Please advise.

## 2020-06-22 NOTE — Telephone Encounter (Signed)
Will check with RaChelle tomorrow to look into blood work.

## 2020-06-22 NOTE — Telephone Encounter (Signed)
Pt. Started having cough,sore throat last Friday. Last started having right ear pain and swollen lymph nodes. No availability in the practice this week. Pt. Will go to [facility].  Reason for Disposition . Earache  (Exceptions: brief ear pain of < 60 minutes duration, earache occurring during air travel  Answer Assessment - Initial Assessment Questions 1. LOCATION: "Which ear is involved?"     Right ear 2. ONSET: "When did the ear start hurting"      Friday 3. SEVERITY: "How bad is the pain?"  (Scale 1-10; mild, moderate or severe)   - MILD (1-3): doesn't interfere with normal activities    - MODERATE (4-7): interferes with normal activities or awakens from sleep    - SEVERE (8-10): excruciating pain, unable to do any normal activities      Now - 5 4. URI SYMPTOMS: "Do you have a runny nose or cough?"     Yes 5. FEVER: "Do you have a fever?" If Yes, ask: "What is your temperature, how was it measured, and when did it start?"     No 6. CAUSE: "Have you been swimming recently?", "How often do you use Q-TIPS?", "Have you had any recent air travel or scuba diving?"     No 7. OTHER SYMPTOMS: "Do you have any other symptoms?" (e.g., headache, stiff neck, dizziness, vomiting, runny nose, decreased hearing)     Swollen lymph nodes,nasal congestion,cough 8. PREGNANCY: "Is there any chance you are pregnant?" "When was your last menstrual period?"     No  Protocols used: EARACHE-A-AH

## 2020-06-22 NOTE — Telephone Encounter (Signed)
I could have added her on

## 2020-06-24 ENCOUNTER — Encounter: Payer: Self-pay | Admitting: Internal Medicine

## 2020-06-24 ENCOUNTER — Telehealth (INDEPENDENT_AMBULATORY_CARE_PROVIDER_SITE_OTHER): Payer: 59 | Admitting: Internal Medicine

## 2020-06-24 ENCOUNTER — Other Ambulatory Visit: Payer: Self-pay

## 2020-06-24 DIAGNOSIS — T7840XD Allergy, unspecified, subsequent encounter: Secondary | ICD-10-CM

## 2020-06-24 DIAGNOSIS — J4530 Mild persistent asthma, uncomplicated: Secondary | ICD-10-CM

## 2020-06-24 DIAGNOSIS — H60392 Other infective otitis externa, left ear: Secondary | ICD-10-CM | POA: Diagnosis not present

## 2020-06-24 DIAGNOSIS — H66011 Acute suppurative otitis media with spontaneous rupture of ear drum, right ear: Secondary | ICD-10-CM | POA: Diagnosis not present

## 2020-06-24 LAB — ALLERGENS W/TOTAL IGE AREA 2

## 2020-06-24 MED ORDER — PREDNISONE 10 MG PO TABS: 10.0000 mg | ORAL_TABLET | Freq: Every day | ORAL | 0 refills | Status: DC

## 2020-06-24 MED ORDER — CIPROFLOXACIN-DEXAMETHASONE 0.3-0.1 % OT SUSP: 4.0000 [drp] | Freq: Two times a day (BID) | OTIC | 0 refills | Status: DC

## 2020-06-24 NOTE — Telephone Encounter (Signed)
Dr. Delorse Lek did an Area 2 panel and the results are now back. Since Dr. Delorse Lek is out of office this week do you mind looking at her results [place] so I can contact the patient?

## 2020-06-24 NOTE — Patient Instructions (Signed)

## 2020-06-24 NOTE — Progress Notes (Signed)
Virtual Visit via Video Note  I connected with Tracy Medina on 06/24/20 at 11:20 AM EDT by a video enabled telemedicine application and verified that I am speaking with the correct person using two identifiers.  Location: Patient: Home Provider: Office  Persons participating in this video call: Nicki Reaper, NP and Tracy Medina   I discussed the limitations of evaluation and management by telemedicine and the availability of in person appointments. The patient expressed understanding and agreed to proceed.  History of Present Illness:  Patient reports bilateral ear pain, drainage from her left ear, swollen glands, runny nose and cough.  She reports this started 1 to 2 weeks ago.  She describes the pain as achy but can be intense at times.  She is blowing clear mucus out of her nose.  The cough is mostly nonproductive.  She went to urgent care 2 days ago.  COVID and strep test were negative.  She was diagnosed with a right ear infection with ruptured tympanic membrane.  She was started on azithromycin, nasal spray and hydrocodone for pain.  She has been taking the medications as prescribed and does feel like her right ear has gotten better but feels like her left ear has gotten worse.  She reports yellow drainage out of her left ear.  She denies fever, chills or body aches.  She does have a history of asthma and allergies.  She is taking Astelin, Flonase, Flovent and albuterol as prescribed.  She is following with the asthma and allergy clinic.  Past Medical History:  Diagnosis Date  . Anxiety   . Arthritis   . Asthma   . Depression   . Endometriosis     Current Outpatient Medications  Medication Sig Dispense Refill  . albuterol (VENTOLIN HFA) 108 (90 Base) MCG/ACT inhaler Inhale 2 puffs into the lungs every 6 (six) hours as needed for wheezing or shortness of breath. 8 g 0  . azelastine (ASTELIN) 0.1 % nasal spray Use two sprays in each nostril twice daily as directed. 30 mL 5   . azithromycin (ZITHROMAX) 250 MG tablet 2 tabs on day one, then 1 tab daily x 4 days    . ciprofloxacin-dexamethasone (CIPRODEX) OTIC suspension Place 4 drops into the left ear 2 (two) times daily. 7.5 mL 0  . dextromethorphan-guaiFENesin (MUCINEX DM) 30-600 MG 12hr tablet Take 1 tablet by mouth 2 (two) times daily. 20 tablet 0  . fluticasone (FLONASE) 50 MCG/ACT nasal spray Use two sprays in each nostril once daily as directed. 16 g 5  . fluticasone (FLOVENT HFA) 44 MCG/ACT inhaler Inhale 1 puff into the lungs 2 (two) times daily. (Patient taking differently: Inhale 1 puff into the lungs 2 (two) times daily as needed.) 1 each 0  . HYDROcodone-acetaminophen (NORCO/VICODIN) 5-325 MG tablet Take one tablet at night for pain; may take up to every 6 hours as needed for pain if not working or driving    . hydrOXYzine (ATARAX/VISTARIL) 25 MG tablet TAKE 1 TABLET BY MOUTH EVERY DAY AS NEEDED 90 tablet 0  . levalbuterol (XOPENEX) 0.31 MG/3ML nebulizer solution Take 3 mLs (0.31 mg total) by nebulization every 4 (four) hours as needed for wheezing. 30 mL 1  . Norethindrone-Ethinyl Estradiol-Fe Biphas (LO LOESTRIN FE) 1 MG-10 MCG / 10 MCG tablet Take 1 tablet by mouth daily. 28 tablet 11  . predniSONE (DELTASONE) 10 MG tablet Take 1 tablet (10 mg total) by mouth daily with breakfast. 5 tablet 0  . traZODone (DESYREL) 50 MG  tablet Take 0.5-1 tablets (25-50 mg total) by mouth at bedtime as needed for sleep. 30 tablet 0  . etonogestrel (NEXPLANON) 68 MG IMPL implant 1 each by Subdermal route once.    . Olopatadine HCl 0.2 % SOLN Apply 1 drop to eye daily as needed (itchy, watery eyes). (Patient not taking: Reported on 06/24/2020) 2.5 mL 5   No current facility-administered medications for this visit.    Allergies  Allergen Reactions  . Cefaclor Hives and Rash    Family History  Adopted: Yes  Problem Relation Age of Onset  . Hypertension Mother     Social History   Socioeconomic History  . Marital  status: Single    Spouse name: Not on file  . Number of children: Not on file  . Years of education: Not on file  . Highest education level: Not on file  Occupational History  . Not on file  Tobacco Use  . Smoking status: Never Smoker  . Smokeless tobacco: Never Used  Vaping Use  . Vaping Use: Never used  Substance and Sexual Activity  . Alcohol use: Yes    Comment: social  . Drug use: Never  . Sexual activity: Not Currently    Birth control/protection: Pill  Other Topics Concern  . Not on file  Social History Narrative  . Not on file   Social Determinants of Health   Financial Resource Strain: Not on file  Food Insecurity: Not on file  Transportation Needs: Not on file  Physical Activity: Not on file  Stress: Not on file  Social Connections: Not on file  Intimate Partner Violence: Not on file     Constitutional: Denies fever, malaise, fatigue, headache or abrupt weight changes.  HEENT: Patient reports bilateral ear pain, left ear drainage and runny nose.  Denies eye pain, eye redness, ringing in the ears, wax buildup, nasal congestion, bloody nose, or sore throat. Respiratory: Pt reports cough. Denies difficulty breathing, shortness of breath, or sputum production.   Cardiovascular: Denies chest pain, chest tightness, palpitations or swelling in the hands or feet.    No other specific complaints in a complete review of systems (except as listed in HPI above).    Observations/Objective:   Wt Readings from Last 3 Encounters:  06/17/20 271 lb 6.4 oz (123.1 kg)  05/24/20 269 lb 9.6 oz (122.3 kg)  04/08/20 276 lb (125.2 kg)    General: Appears her stated age, unwell but in NAD. HEENT: Head: normal shape and size; Ears: no noticeable swelling of the left external ear but she reports yellow drainage from the ear canal; Nose: no congestion noted; Throat/Mouth: slight hoareseness noted.  Pulmonary/Chest: Normal effort. Dry cough noted. No respiratory distress.   Neurological: Alert and oriented.   BMET    Component Value Date/Time   NA 138 05/24/2020 0922   K 4.2 05/24/2020 0922   CL 104 05/24/2020 0922   CO2 24 05/24/2020 0922   GLUCOSE 88 05/24/2020 0922   BUN 9 05/24/2020 0922   CREATININE 0.97 05/24/2020 0922   CALCIUM 9.8 05/24/2020 0922    Lipid Panel     Component Value Date/Time   CHOL 160 05/24/2020 0922   TRIG 77 05/24/2020 0922   HDL 48 (L) 05/24/2020 0922   CHOLHDL 3.3 05/24/2020 0922   LDLCALC 95 05/24/2020 0922    CBC    Component Value Date/Time   WBC 9.6 01/25/2020 1144   RBC 4.78 01/25/2020 1144   HGB 13.2 01/25/2020 1144   HCT  40.1 01/25/2020 1144   PLT 350.0 01/25/2020 1144   MCV 83.9 01/25/2020 1144   MCHC 32.8 01/25/2020 1144   RDW 14.1 01/25/2020 1144    Hgb A1C Lab Results  Component Value Date   HGBA1C 5.4 01/25/2020       Assessment and Plan:  UC Follow Up for Right Otitis Media:  No UC notes to review It sounds like she has now developed a left otitis externa Continue Azithromycin and Hydrocodone as previously prescribed Continue Astelin, Flonase and Albuterol Increase Flovent to 2 puffs BID RX for Prednisone 10 mg daily x 5 days RX for Ciprodex ear drops on the left BID x 5 days She will continue to follow with the asthma and allergy clinic  Return precautions discussed  Follow Up Instructions:    I discussed the assessment and treatment plan with the patient. The patient was provided an opportunity to ask questions and all were answered. The patient agreed with the plan and demonstrated an understanding of the instructions.   The patient was advised to call back or seek an in-person evaluation if the symptoms worsen or if the condition fails to improve as anticipated.    Nicki Reaper, NP

## 2020-06-24 NOTE — Telephone Encounter (Signed)
Can you please let this patient know that the blood test for environmental allergies was negative. Her skin testing from her last appointment did indicate allergy to mold, dog, and cockroach. She should continue to employ avoidance measures to those items. Thank you

## 2020-06-24 NOTE — Telephone Encounter (Signed)
Hi [name] were you able to speak with [name] regarding this blood work?

## 2020-06-24 NOTE — Telephone Encounter (Signed)
Called patient and reviewed labs with her. Patient verbalized understanding. She is wondering what could possibly be causing the cough and throat closure and ear infections is wondering what could possibly be causing this? I advised that Dr. Delorse Lek will be back in office Tuesday and will review this then. Patient verbalized understanding and was fine with this.

## 2020-06-27 ENCOUNTER — Encounter: Payer: Self-pay | Admitting: Internal Medicine

## 2020-06-27 MED ORDER — FLUOXETINE HCL 20 MG PO CAPS: 20.0000 mg | ORAL_CAPSULE | Freq: Every day | ORAL | 1 refills | Status: DC

## 2020-06-27 NOTE — Telephone Encounter (Signed)
In regards to ear infections it is possible she could have a eustachian tube dysfunction which is causing improper drainage of the ear canal.  If she has not seen ENT then this could help to determine if she does have eustachian tube dysfunction.   In regards to her cough and throat symptoms she did have positive allergy testing to mold, cockroach and dog which could drive symptoms any 1 of these.  It is also possible that she is having an irritant effect from pollen inhalation.  There are definitely folks who have negative testing to pollen that are symptomatic with exposure to pollen due to it being irritating to the airway.  The yellow pollen especially is a large particle it when it is inhaled the body will do what he can to get rid of this particle from being in the airway like causing you to cough, sneeze, develop nasal congestion and drainage, itchy watery eyes which are all similar to an allergic response.  Would continue the antihistamine/allergy regimen and avoidance measures and if these are not helpful enough to control symptoms then would consider allergy immunotherapy.

## 2020-06-27 NOTE — Telephone Encounter (Signed)
Called and discussed with the patient the recommendations. Patient verbalized understanding and will continue allergy medications and will hold off allergy injections for now and will call back if she wants to start.

## 2020-06-29 ENCOUNTER — Telehealth: Payer: Self-pay

## 2020-06-29 NOTE — Telephone Encounter (Signed)
Copied from CRM [id number]. Topic: General - Other >> Jun 29, 2020 11:10 AM [name], Tracy Medina wrote: Reason for CRM: Pt was recently seen for Medina double ear infection, pt stated she finished her meds and has been doing ear drops but still has blockage in her ears along with ringing, pt wanted to know if she needs to come into the office again or if there is anything else that can be sent in because she is not feeling much better.

## 2020-06-30 NOTE — Telephone Encounter (Signed)
Does she see ENT? If not, would recommend referral to ENT.

## 2020-06-30 NOTE — Telephone Encounter (Signed)
Pt is calling checking on the status of ear infection please advise

## 2020-08-12 ENCOUNTER — Other Ambulatory Visit: Payer: Self-pay | Admitting: Internal Medicine

## 2020-10-17 ENCOUNTER — Ambulatory Visit: Payer: 59 | Admitting: Allergy

## 2020-11-01 ENCOUNTER — Ambulatory Visit: Payer: Self-pay | Admitting: *Deleted

## 2020-11-01 NOTE — Telephone Encounter (Signed)
Pt called stating that she has a cough and congestion. She states it may be covid and is requesting to have care advice. Please advise  Reason for Disposition  [1] HIGH RISK for severe COVID complications (e.g., weak immune system, age > 64 years, obesity with BMI > 25, pregnant, chronic lung disease or other chronic medical condition) AND [2] COVID symptoms (e.g., cough, fever)  (Exceptions: Already seen by PCP and no new or worsening symptoms.)  Answer Assessment - Initial Assessment Questions 1. COVID-19 DIAGNOSIS: "Who made your COVID-19 diagnosis?" "Was it confirmed by a positive lab test or self-test?" If not diagnosed by a doctor (or NP/PA), ask "Are there lots of cases (community spread) where you live?" Note: See public health department website, if unsure.     Suspect symptoms 2. COVID-19 EXPOSURE: "Was there any known exposure to COVID before the symptoms began?" CDC Definition of close contact: within 6 feet (2 meters) for a total of 15 minutes or more over a 24-hour period.      No known 3. ONSET: "When did the COVID-19 symptoms start?"      Friday 4. WORST SYMPTOM: "What is your worst symptom?" (e.g., cough, fever, shortness of breath, muscle aches)     Cough/congestion 5. COUGH: "Do you have a cough?" If Yes, ask: "How bad is the cough?"       Yes- frequent 6. FEVER: "Do you have a fever?" If Yes, ask: "What is your temperature, how was it measured, and when did it start?"     no 7. RESPIRATORY STATUS: "Describe your breathing?" (e.g., shortness of breath, wheezing, unable to speak)      Slightly labored- plans to use nebulizer to help that 8. BETTER-SAME-WORSE: "Are you getting better, staying the same or getting worse compared to yesterday?"  If getting worse, ask, "In what way?"     Same-patient is afraid the cough and congestion will exacerbate her asthma 9. HIGH RISK DISEASE: "Do you have any chronic medical problems?" (e.g., asthma, heart or lung disease, weak immune system,  obesity, etc.)     asthma  Protocols used: Coronavirus (COVID-19) Diagnosed or Suspected-A-AH

## 2020-11-01 NOTE — Telephone Encounter (Signed)
Call to patient- patient reports she has cough since last Friday with congestion. Patient has not screened for COVID- advised please screen at home. Patient is concerned that this cough/congestion may trigger her asthma- she states this happens every year- but she has not done her nebulizer treatment yet. She is going to do these 2 things and will call back if + COVID test or nebulizer treatment does not help. Advised UC telemedicine option and patient has appointment this week with PCP.

## 2020-11-03 ENCOUNTER — Telehealth (INDEPENDENT_AMBULATORY_CARE_PROVIDER_SITE_OTHER): Payer: 59 | Admitting: Internal Medicine

## 2020-11-03 ENCOUNTER — Encounter: Payer: Self-pay | Admitting: Internal Medicine

## 2020-11-03 ENCOUNTER — Other Ambulatory Visit: Payer: Self-pay

## 2020-11-03 VITALS — Temp 97.5°F

## 2020-11-03 DIAGNOSIS — J069 Acute upper respiratory infection, unspecified: Secondary | ICD-10-CM | POA: Diagnosis not present

## 2020-11-03 DIAGNOSIS — J4541 Moderate persistent asthma with (acute) exacerbation: Secondary | ICD-10-CM | POA: Diagnosis not present

## 2020-11-03 MED ORDER — PREDNISONE 10 MG PO TABS: ORAL_TABLET | ORAL | 0 refills | Status: DC

## 2020-11-03 MED ORDER — AZITHROMYCIN 250 MG PO TABS: ORAL_TABLET | ORAL | 0 refills | Status: DC

## 2020-11-03 MED ORDER — FLUTICASONE PROPIONATE HFA 44 MCG/ACT IN AERO: 2.0000 | INHALATION_SPRAY | Freq: Two times a day (BID) | RESPIRATORY_TRACT | 0 refills | Status: DC

## 2020-11-03 MED ORDER — ALBUTEROL SULFATE HFA 108 (90 BASE) MCG/ACT IN AERS: 2.0000 | INHALATION_SPRAY | Freq: Four times a day (QID) | RESPIRATORY_TRACT | 0 refills | Status: DC | PRN

## 2020-11-03 MED ORDER — BENZONATATE 200 MG PO CAPS: 200.0000 mg | ORAL_CAPSULE | Freq: Three times a day (TID) | ORAL | 0 refills | Status: DC | PRN

## 2020-11-03 NOTE — Progress Notes (Signed)
Virtual Visit via Video Note  I connected with Tracy Medina on 11/03/20 at 11:20 AM EDT by a video enabled telemedicine application and verified that I am speaking with the correct person using two identifiers.  Location: Patient: Home Provider: Office  Persons participating in this video call: Nicki Reaper, NP and Tracy Medina   I discussed the limitations of evaluation and management by telemedicine and the availability of in person appointments. The patient expressed understanding and agreed to proceed.  History of Present Illness:  Patient reports headache, runny nose, nasal congestion, ear pain, sore throat, cough, chest congestion and shortness of breath.  She reports this started 6 days ago.  She is not blowing much out of her nose.  She denies difficulty swallowing.  The cough is productive of yellow mucus.  She has been wheezing and feeling tight in her chest.  She reports fever for 2 days but denies chills or body aches.  She denies nausea, vomiting or diarrhea.  She took a home COVID test which was negative.  She has not had COVID exposure that she is aware of.  She does have a history of asthma and reports she is out of her inhalers but has been using her nebulizer machine.   Past Medical History:  Diagnosis Date   Anxiety    Arthritis    Asthma    Depression    Endometriosis     Current Outpatient Medications  Medication Sig Dispense Refill   azithromycin (ZITHROMAX) 250 MG tablet Take 2 tabs today, then 1 tab daily x 4 days 6 tablet 0   benzonatate (TESSALON) 200 MG capsule Take 1 capsule (200 mg total) by mouth 3 (three) times daily as needed for cough. 30 capsule 0   dextromethorphan-guaiFENesin (MUCINEX DM) 30-600 MG 12hr tablet Take 1 tablet by mouth 2 (two) times daily. 20 tablet 0   FLUoxetine (PROZAC) 20 MG capsule Take 1 capsule (20 mg total) by mouth daily. 90 capsule 1   fluticasone (FLONASE) 50 MCG/ACT nasal spray Use two sprays in each nostril once  daily as directed. 16 g 5   levalbuterol (XOPENEX) 0.31 MG/3ML nebulizer solution Take 3 mLs (0.31 mg total) by nebulization every 4 (four) hours as needed for wheezing. 30 mL 1   Norethindrone-Ethinyl Estradiol-Fe Biphas (LO LOESTRIN FE) 1 MG-10 MCG / 10 MCG tablet Take 1 tablet by mouth daily. 28 tablet 11   predniSONE (DELTASONE) 10 MG tablet Take 6 tabs on day 1, 5 tabs on day 2, 4 tabs on day 3, 3 tabs on day 4, 2 tabs on day 5, 1 tab on day 6 21 tablet 0   traZODone (DESYREL) 50 MG tablet Take 0.5-1 tablets (25-50 mg total) by mouth at bedtime as needed for sleep. 30 tablet 0   albuterol (VENTOLIN HFA) 108 (90 Base) MCG/ACT inhaler Inhale 2 puffs into the lungs every 6 (six) hours as needed for wheezing or shortness of breath. 8 g 0   azelastine (ASTELIN) 0.1 % nasal spray Use two sprays in each nostril twice daily as directed. (Patient not taking: Reported on 11/03/2020) 30 mL 5   etonogestrel (NEXPLANON) 68 MG IMPL implant 1 each by Subdermal route once.     fluticasone (FLOVENT HFA) 44 MCG/ACT inhaler Inhale 2 puffs into the lungs 2 (two) times daily. 1 each 0   hydrOXYzine (ATARAX/VISTARIL) 25 MG tablet TAKE 1 TABLET BY MOUTH EVERY DAY AS NEEDED (Patient not taking: Reported on 11/03/2020) 90 tablet 0   No  current facility-administered medications for this visit.    Allergies  Allergen Reactions   Cefaclor Hives and Rash    Family History  Adopted: Yes  Problem Relation Age of Onset   Hypertension Mother     Social History   Socioeconomic History   Marital status: Single    Spouse name: Not on file   Number of children: Not on file   Years of education: Not on file   Highest education level: Not on file  Occupational History   Not on file  Tobacco Use   Smoking status: Never   Smokeless tobacco: Never  Vaping Use   Vaping Use: Never used  Substance and Sexual Activity   Alcohol use: Yes    Comment: social   Drug use: Never   Sexual activity: Not Currently     Birth control/protection: Pill  Other Topics Concern   Not on file  Social History Narrative   Not on file   Social Determinants of Health   Financial Resource Strain: Not on file  Food Insecurity: Not on file  Transportation Needs: Not on file  Physical Activity: Not on file  Stress: Not on file  Social Connections: Not on file  Intimate Partner Violence: Not on file     Constitutional: Patient reports headache, fever, malaise.  Denies abrupt weight changes.  HEENT: Patient reports runny nose, nasal congestion, ear pain, sore throat.  Denies eye pain, eye redness, ringing in the ears, wax buildup, bloody nose Respiratory: Patient reports cough and shortness of breath.  Denies difficulty breathing.   Cardiovascular: Denies chest pain, chest tightness, palpitations or swelling in the hands or feet.  Gastrointestinal: Denies abdominal pain, bloating, constipation, diarrhea or blood in the stool.   No other specific complaints in a complete review of systems (except as listed in HPI above).    Observations/Objective: Temp (!) 97.5 F (36.4 C) (Oral)  Wt Readings from Last 3 Encounters:  06/17/20 271 lb 6.4 oz (123.1 kg)  05/24/20 269 lb 9.6 oz (122.3 kg)  04/08/20 276 lb (125.2 kg)    General: Appears her stated age, obese, unwell but in NAD. HEENT: Head: normal shape and size;  Nose: Congestion noted; Throat/Mouth: Hoarseness noted.  Pulmonary/Chest: Normal effort with congested cough noted.  No respiratory distress.  Neurological: Alert and oriented.   BMET    Component Value Date/Time   NA 138 05/24/2020 0922   K 4.2 05/24/2020 0922   CL 104 05/24/2020 0922   CO2 24 05/24/2020 0922   GLUCOSE 88 05/24/2020 0922   BUN 9 05/24/2020 0922   CREATININE 0.97 05/24/2020 0922   CALCIUM 9.8 05/24/2020 0922    Lipid Panel     Component Value Date/Time   CHOL 160 05/24/2020 0922   TRIG 77 05/24/2020 0922   HDL 48 (L) 05/24/2020 0922   CHOLHDL 3.3 05/24/2020 0922    LDLCALC 95 05/24/2020 0922    CBC    Component Value Date/Time   WBC 9.6 01/25/2020 1144   RBC 4.78 01/25/2020 1144   HGB 13.2 01/25/2020 1144   HCT 40.1 01/25/2020 1144   PLT 350.0 01/25/2020 1144   MCV 83.9 01/25/2020 1144   MCHC 32.8 01/25/2020 1144   RDW 14.1 01/25/2020 1144    Hgb A1C Lab Results  Component Value Date   HGBA1C 5.4 01/25/2020        Assessment and Plan:  URI with Cough, Asthma Exacerbation:  Encourage rest and fluids Given duration of symptoms will treat  with Azithromycin 250 mg x 5 days Rx for Pred taper x6 days Rx for Tessalon 200 mg 3 times daily as needed for cough Flovent and Albuterol refilled Continue neb treatments every 4 hours as needed Work note provided  Return precautions discussed  Follow Up Instructions:    I discussed the assessment and treatment plan with the patient. The patient was provided an opportunity to ask questions and all were answered. The patient agreed with the plan and demonstrated an understanding of the instructions.   The patient was advised to call back or seek an in-person evaluation if the symptoms worsen or if the condition fails to improve as anticipated.   Nicki Reaper, NP

## 2020-11-03 NOTE — Patient Instructions (Signed)
Upper Respiratory Infection, Adult  An upper respiratory infection (URI) affects the nose, throat, and upper air passages. URIs are caused by germs (viruses). The most common type of URI is often called "the common cold."  Medicines cannot cure URIs, but you can do things at home to relieve your symptoms. URIs usually get better within 7-10 days.  Follow these instructions at home:  Activity  Rest as needed.  If you have a fever, stay home from work or school until your fever is gone, or until your doctor says you may return to work or school.  You should stay home until you cannot spread the infection anymore (you are not contagious).  Your doctor may have you wear a face mask so you have less risk of spreading the infection.  Relieving symptoms  Gargle with a salt-water mixture 3-4 times a day or as needed. To make a salt-water mixture, completely dissolve -1 tsp of salt in 1 cup of warm water.  Use a cool-mist humidifier to add moisture to the air. This can help you breathe more easily.  Eating and drinking    Drink enough fluid to keep your pee (urine) pale yellow.  Eat soups and other clear broths.  General instructions    Take over-the-counter and prescription medicines only as told by your doctor. These include cold medicines, fever reducers, and cough suppressants.  Do not use any products that contain nicotine or tobacco. These include cigarettes and e-cigarettes. If you need help quitting, ask your doctor.  Avoid being where people are smoking (avoid secondhand smoke).  Make sure you get regular shots and get the flu shot every year.  Keep all follow-up visits as told by your doctor. This is important.  How to avoid spreading infection to others    Wash your hands often with soap and water. If you do not have soap and water, use hand sanitizer.  Avoid touching your mouth, face, eyes, or nose.  Cough or sneeze into a tissue or your sleeve or elbow. Do not cough or sneeze into your hand or into the  air.  Contact a doctor if:  You are getting worse, not better.  You have any of these:  A fever.  Chills.  Brown or red mucus in your nose.  Yellow or brown fluid (discharge)coming from your nose.  Pain in your face, especially when you bend forward.  Swollen neck glands.  Pain with swallowing.  White areas in the back of your throat.  Get help right away if:  You have shortness of breath that gets worse.  You have very bad or constant:  Headache.  Ear pain.  Pain in your forehead, behind your eyes, and over your cheekbones (sinus pain).  Chest pain.  You have long-lasting (chronic) lung disease along with any of these:  Wheezing.  Long-lasting cough.  Coughing up blood.  A change in your usual mucus.  You have a stiff neck.  You have changes in your:  Vision.  Hearing.  Thinking.  Mood.  Summary  An upper respiratory infection (URI) is caused by a germ called a virus. The most common type of URI is often called "the common cold."  URIs usually get better within 7-10 days.  Take over-the-counter and prescription medicines only as told by your doctor.  This information is not intended to replace advice given to you by your health care provider. Make sure you discuss any questions you have with your health care provider.  Document   Revised: 09/17/2019 Document Reviewed: 09/17/2019  [employer]vier Patient Education  2022 [employer]

## 2020-11-25 ENCOUNTER — Other Ambulatory Visit: Payer: Self-pay | Admitting: Internal Medicine

## 2020-11-25 NOTE — Telephone Encounter (Signed)
Requested medications are due for refill today requesting early  Requested medications are on the active medication list yes  Last refill 11/03/20  Last visit 11/03/20  Future visit scheduled 05/30/21  Notes to clinic pt requesting inhaler early, seen 11/03/20 and was only ordered one inhaler, please assess.  Requested Prescriptions  Pending Prescriptions Disp Refills   albuterol (VENTOLIN HFA) 108 (90 Base) MCG/ACT inhaler [Pharmacy Med Name: ALBUTEROL HFA (PROAIR) INHALER] 8.5 each     Sig: TAKE 2 PUFFS BY MOUTH EVERY 6 HOURS AS NEEDED FOR WHEEZE OR SHORTNESS OF BREATH     Pulmonology:  Beta Agonists Failed - 11/25/2020  1:49 AM      Failed - One inhaler should last at least one month. If the patient is requesting refills earlier, contact the patient to check for uncontrolled symptoms.      Passed - Valid encounter within last 12 months    Recent Outpatient Visits           3 weeks ago URI with cough and congestion   [facility], [place] W, NP   5 months ago Non-recurrent acute suppurative otitis media of right ear with spontaneous rupture of tympanic membrane   [facility], Salvadore Oxford, NP   6 months ago Encounter for general adult medical examination with abnormal findings   [facility], Salvadore Oxford, NP       Future Appointments             In 6 months Baity, Salvadore Oxford, NP [facility]

## 2020-11-30 ENCOUNTER — Other Ambulatory Visit: Payer: Self-pay | Admitting: Internal Medicine

## 2020-11-30 NOTE — Telephone Encounter (Signed)
Requested Prescriptions  Pending Prescriptions Disp Refills  . FLOVENT HFA 44 MCG/ACT inhaler [Pharmacy Med Name: FLOVENT HFA 44 MCG INHALER] 10.6 each 1    Sig: INHALE 2 PUFFS INTO THE LUNGS TWICE A DAY     Pulmonology:  Corticosteroids Passed - 11/30/2020  3:02 AM      Passed - Valid encounter within last 12 months    Recent Outpatient Visits          3 weeks ago URI with cough and congestion   [facility], [place] W, NP   5 months ago Non-recurrent acute suppurative otitis media of right ear with spontaneous rupture of tympanic membrane   [facility], Salvadore Oxford, NP   6 months ago Encounter for general adult medical examination with abnormal findings   [facility], Salvadore Oxford, NP      Future Appointments            In 6 months Baity, Salvadore Oxford, NP [facility]

## 2020-12-20 ENCOUNTER — Telehealth: Payer: Self-pay

## 2020-12-20 NOTE — Telephone Encounter (Signed)
Patient had nexplanon placed 04/08/20. States she has endometriosis.  She is having incredible pain (absolutely awful) to the point she is in tears. Inquiring if there is anything that can be done to take a look to see what's going on. [phone]

## 2020-12-20 NOTE — Telephone Encounter (Signed)
Spoke w/patient. Pain started last week with back pain. She's due for her period. She is only spotting. She has been having regular periods with the nexplanon. This one seems like the blood just isn't coming out. It's pain she has when her endometriosis flares up. Patient is actually at our office now. Advised to check with front desk to schedule appointment for evaluation.
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
## 2020-12-22 ENCOUNTER — Other Ambulatory Visit: Payer: Self-pay

## 2020-12-22 ENCOUNTER — Ambulatory Visit (INDEPENDENT_AMBULATORY_CARE_PROVIDER_SITE_OTHER): Payer: 59 | Admitting: Obstetrics and Gynecology

## 2020-12-22 ENCOUNTER — Encounter: Payer: Self-pay | Admitting: Obstetrics and Gynecology

## 2020-12-22 ENCOUNTER — Other Ambulatory Visit: Payer: Self-pay | Admitting: Obstetrics and Gynecology

## 2020-12-22 ENCOUNTER — Encounter: Payer: Self-pay | Admitting: Internal Medicine

## 2020-12-22 VITALS — BP 110/80 | Ht 66.0 in | Wt 276.0 lb

## 2020-12-22 DIAGNOSIS — R102 Pelvic and perineal pain: Secondary | ICD-10-CM | POA: Diagnosis not present

## 2020-12-22 DIAGNOSIS — N809 Endometriosis, unspecified: Secondary | ICD-10-CM | POA: Diagnosis not present

## 2020-12-22 MED ORDER — BUTALBITAL-APAP-CAFFEINE 50-325-40 MG PO CAPS
1.0000 | ORAL_CAPSULE | Freq: Four times a day (QID) | ORAL | 0 refills | Status: DC | PRN
Start: 2020-12-22 — End: 2021-06-24

## 2020-12-22 MED ORDER — ORILISSA 150 MG PO TABS: 150.0000 mg | ORAL_TABLET | Freq: Every day | ORAL | 2 refills | Status: DC

## 2020-12-22 MED ORDER — FLUOXETINE HCL 40 MG PO CAPS: 40.0000 mg | ORAL_CAPSULE | Freq: Every day | ORAL | 2 refills | Status: DC

## 2020-12-22 NOTE — Telephone Encounter (Signed)
PA

## 2020-12-26 NOTE — Telephone Encounter (Signed)
PA submitted.

## 2020-12-26 NOTE — Telephone Encounter (Signed)
PA approved.

## 2020-12-26 NOTE — Telephone Encounter (Signed)
Does pharm know this? Thx.

## 2020-12-26 NOTE — Telephone Encounter (Signed)
Pharmacy aware, will go ahead and get Rx ready for pt.

## 2020-12-29 ENCOUNTER — Ambulatory Visit: Payer: 59 | Admitting: Obstetrics and Gynecology

## 2021-02-21 ENCOUNTER — Encounter: Payer: Self-pay | Admitting: Internal Medicine

## 2021-02-21 ENCOUNTER — Ambulatory Visit
Admission: RE | Admit: 2021-02-21 | Discharge: 2021-02-21 | Disposition: A | Discharge status: Home / Self Care | Payer: 59 | Source: Ambulatory Visit | Attending: Student | Admitting: Student

## 2021-02-21 ENCOUNTER — Other Ambulatory Visit: Payer: Self-pay

## 2021-02-21 VITALS — BP 125/87 | HR 83 | Temp 99.2°F | Resp 18

## 2021-02-21 DIAGNOSIS — Z789 Other specified health status: Secondary | ICD-10-CM | POA: Insufficient documentation

## 2021-02-21 DIAGNOSIS — J4541 Moderate persistent asthma with (acute) exacerbation: Secondary | ICD-10-CM | POA: Diagnosis not present

## 2021-02-21 DIAGNOSIS — Z112 Encounter for screening for other bacterial diseases: Secondary | ICD-10-CM | POA: Diagnosis not present

## 2021-02-21 LAB — POCT RAPID STREP A (OFFICE): Rapid Strep A Screen: NEGATIVE

## 2021-02-21 MED ORDER — BENZONATATE 100 MG PO CAPS: 100.0000 mg | ORAL_CAPSULE | Freq: Three times a day (TID) | ORAL | 0 refills | Status: DC

## 2021-02-21 NOTE — ED Triage Notes (Signed)
Pt c/o ST and cough sxs started last night

## 2021-02-21 NOTE — Discharge Instructions (Addendum)
-  Tessalon (Benzonatate) as needed for cough. Take one pill up to 3x daily (every 8 hours) -Albuterol inhaler or nebulizer as needed for cough, wheezing, shortness of breath, 1 to 2 puffs every 6 hours as needed. -Increase flovent to twice daily while symptoms persist.  -With a virus, you're typically contagious for 5-7 days, or as long as you're having fevers.

## 2021-02-21 NOTE — ED Provider Notes (Signed)
Roderic Palau    CSN: [id number] Arrival date & time: 02/21/21  1142      History   Chief Complaint Chief Complaint  Patient presents with   Sore Throat   Cough    HPI Mariaelena Kassman is a 31 y.o. female presenting with cough and sore throat x1 day. Medical history asthma, takes flovent and albuterol inhalers daily. Describes cough, nonproductive. Some SOB and wheezing throughout the day, requiring albuterol nebulizer 3x daily with relief. Sore throat. Denies fevers/chills, myalgias, n/v/d/c. Denies known sick contacts.   HPI  Past Medical History:  Diagnosis Date   Anxiety    Arthritis    Asthma    Depression    Endometriosis     Patient Active Problem List   Diagnosis Date Noted   Insomnia 05/24/2020   Elevated blood pressure reading in office without diagnosis of hypertension 05/24/2020   Endometriosis 02/29/2020   Iron deficiency 02/29/2020   Anxiety and depression 02/29/2020   Asthma 02/29/2020   OA (osteoarthritis of the spine) 02/29/2020    Past Surgical History:  Procedure Laterality Date   CHOLECYSTECTOMY     LAPAROSCOPY     TONSILLECTOMY AND ADENOIDECTOMY     TYMPANOSTOMY TUBE PLACEMENT      OB History     Gravida  0   Para  0   Term  0   Preterm  0   AB  0   Living  0      SAB  0   IAB  0   Ectopic  0   Multiple  0   Live Births  0            Home Medications    Prior to Admission medications   Medication Sig Start Date End Date Taking? Authorizing Provider  benzonatate (TESSALON) 100 MG capsule Take 1 capsule (100 mg total) by mouth every 8 (eight) hours. 02/21/21  Yes Hazel Sams, PA-C  albuterol (VENTOLIN HFA) 108 (90 Base) MCG/ACT inhaler TAKE 2 PUFFS BY MOUTH EVERY 6 HOURS AS NEEDED FOR WHEEZE OR SHORTNESS OF BREATH 11/25/20   Jearld Fenton, NP  Butalbital-APAP-Caffeine [phone] MG capsule Take 1-2 capsules by mouth every 6 (six) hours as needed for headache. [id number]   Copland, Deirdre Evener, PA-C   Elagolix Sodium (ORILISSA) 150 MG TABS Take 150 mg by mouth daily. [id number]   Copland, Deirdre Evener, PA-C  etonogestrel (NEXPLANON) 68 MG IMPL implant 1 each by Subdermal route once. 04/08/20 04/08/20  [provider]  FLOVENT HFA 44 MCG/ACT inhaler INHALE 2 PUFFS INTO THE LUNGS TWICE A DAY 11/30/20   Jearld Fenton, NP  FLUoxetine (PROZAC) 40 MG capsule Take 1 capsule (40 mg total) by mouth daily. 12/22/20   Jearld Fenton, NP  hydrOXYzine (ATARAX/VISTARIL) 25 MG tablet TAKE 1 TABLET BY MOUTH EVERY DAY AS NEEDED 08/12/20   Jearld Fenton, NP  levalbuterol (XOPENEX) 0.31 MG/3ML nebulizer solution Take 3 mLs (0.31 mg total) by nebulization every 4 (four) hours as needed for wheezing. 04/23/20   Jearld Fenton, NP  traZODone (DESYREL) 50 MG tablet Take 0.5-1 tablets (25-50 mg total) by mouth at bedtime as needed for sleep. 06/22/20   Jearld Fenton, NP    Family History Family History  Adopted: Yes  Problem Relation Age of Onset   Hypertension Mother     Social History Social History   Tobacco Use   Smoking status: Never   Smokeless tobacco: Never  Vaping Use  Vaping Use: Never used  Substance Use Topics   Alcohol use: Yes    Comment: social   Drug use: Never     Allergies   Cefaclor   Review of Systems Review of Systems  Constitutional:  Negative for appetite change, chills and fever.  HENT:  Positive for congestion and sore throat. Negative for ear pain, rhinorrhea, sinus pressure and sinus pain.   Eyes:  Negative for redness and visual disturbance.  Respiratory:  Positive for cough and wheezing. Negative for chest tightness and shortness of breath.   Cardiovascular:  Negative for chest pain and palpitations.  Gastrointestinal:  Negative for abdominal pain, constipation, diarrhea, nausea and vomiting.  Genitourinary:  Negative for dysuria, frequency and urgency.  Musculoskeletal:  Negative for myalgias.  Neurological:  Negative for dizziness, weakness and headaches.   Psychiatric/Behavioral:  Negative for confusion.   All other systems reviewed and are negative.   Physical Exam Triage Vital Signs ED Triage Vitals  Enc Vitals Group     BP      Pulse      Resp      Temp      Temp src      SpO2      Weight      Height      Head Circumference      Peak Flow      Pain Score      Pain Loc      Pain Edu?      Excl. in GC?    No data found.  Updated Vital Signs BP 125/87 (BP Location: Left Arm)    Pulse 83    Temp 99.2 F (37.3 C) (Oral)    Resp 18    LMP 02/07/2021    SpO2 97%   Visual Acuity Right Eye Distance:   Left Eye Distance:   Bilateral Distance:    Right Eye Near:   Left Eye Near:    Bilateral Near:     Physical Exam Vitals reviewed.  Constitutional:      General: She is not in acute distress.    Appearance: Normal appearance. She is not ill-appearing.  HENT:     Head: Normocephalic and atraumatic.     Right Ear: Tympanic membrane, ear canal and external ear normal. No tenderness. No middle ear effusion. There is no impacted cerumen. Tympanic membrane is not perforated, erythematous, retracted or bulging.     Left Ear: Tympanic membrane, ear canal and external ear normal. No tenderness.  No middle ear effusion. There is no impacted cerumen. Tympanic membrane is not perforated, erythematous, retracted or bulging.     Nose: Nose normal. No congestion.     Mouth/Throat:     Mouth: Mucous membranes are moist.     Pharynx: Uvula midline. Posterior oropharyngeal erythema present. No oropharyngeal exudate.  Eyes:     Extraocular Movements: Extraocular movements intact.     Pupils: Pupils are equal, round, and reactive to light.  Cardiovascular:     Rate and Rhythm: Normal rate and regular rhythm.     Heart sounds: Normal heart sounds.  Pulmonary:     Effort: Pulmonary effort is normal.     Breath sounds: Normal breath sounds. No decreased breath sounds, wheezing, rhonchi or rales.  Abdominal:     Palpations: Abdomen is  soft.     Tenderness: There is no abdominal tenderness. There is no guarding or rebound.  Lymphadenopathy:     Cervical: No cervical adenopathy.  Right cervical: No superficial cervical adenopathy.    Left cervical: No superficial cervical adenopathy.  Neurological:     General: No focal deficit present.     Mental Status: She is alert and oriented to person, place, and time.  Psychiatric:        Mood and Affect: Mood normal.        Behavior: Behavior normal.        Thought Content: Thought content normal.        Judgment: Judgment normal.     UC Treatments / Results  Labs (all labs ordered are listed, but only abnormal results are displayed) Labs Reviewed  POCT RAPID STREP A (OFFICE)    EKG   Radiology No results found.  Procedures Procedures (including critical care time)  Medications Ordered in UC Medications - No data to display  Initial Impression / Assessment and Plan / UC Course  I have reviewed the triage vital signs and the nursing notes.  Pertinent labs & imaging results that were available during my care of the patient were reviewed by me and considered in my medical decision making (see chart for details).     This patient is a very pleasant 32 y.o. year old female presenting with viral pharyngitis and asthma exacerbation. Afebrile, nontachy. Implant contraception.  Rapid strep negative, culture sent..  Declines covid and influenza PCR.   Initially planned to send prednisone; she declines due to side effects. Will manage with tessalon, which works well for her. Continue albuterol inhaler/nebulizer prn. Increase flovent to twice daily while symptoms persist. Declines refills on inhalers.  ED return precautions discussed. Patient verbalizes understanding and agreement.   Coding Level 4 for acute exacerbation of chronic condition, and prescription drug management   Final Clinical Impressions(s) / UC Diagnoses   Final diagnoses:  Moderate  persistent asthma with acute exacerbation  Uses contraceptive implant for birth control  Screening for streptococcal infection     Discharge Instructions      -Tessalon (Benzonatate) as needed for cough. Take one pill up to 3x daily (every 8 hours) -Albuterol inhaler or nebulizer as needed for cough, wheezing, shortness of breath, 1 to 2 puffs every 6 hours as needed. -Increase flovent to twice daily while symptoms persist.  -With a virus, you're typically contagious for 5-7 days, or as long as you're having fevers.       ED Prescriptions     Medication Sig Dispense Auth. Provider   benzonatate (TESSALON) 100 MG capsule Take 1 capsule (100 mg total) by mouth every 8 (eight) hours. 21 capsule Hazel Sams, PA-C      PDMP not reviewed this encounter.   Hazel Sams, PA-C 02/21/21 1248

## 2021-02-24 LAB — CULTURE, GROUP A STREP (THRC)

## 2021-04-07 ENCOUNTER — Other Ambulatory Visit: Payer: Self-pay

## 2021-04-07 ENCOUNTER — Encounter: Payer: Self-pay | Admitting: Internal Medicine

## 2021-04-07 ENCOUNTER — Encounter (HOSPITAL_COMMUNITY): Payer: Self-pay

## 2021-04-07 ENCOUNTER — Ambulatory Visit (HOSPITAL_COMMUNITY)
Admission: RE | Admit: 2021-04-07 | Discharge: 2021-04-07 | Disposition: A | Discharge status: Home / Self Care | Payer: 59 | Source: Ambulatory Visit | Attending: Family Medicine | Admitting: Family Medicine

## 2021-04-07 VITALS — BP 136/66 | HR 81 | Temp 98.6°F | Resp 18

## 2021-04-07 DIAGNOSIS — J45901 Unspecified asthma with (acute) exacerbation: Secondary | ICD-10-CM

## 2021-04-07 MED ORDER — PREDNISONE 20 MG PO TABS: 40.0000 mg | ORAL_TABLET | Freq: Every day | ORAL | 0 refills | Status: DC

## 2021-04-07 NOTE — ED Provider Notes (Signed)
?[facility] ? ? ? ?CSN: [id number] ?Arrival date & time: 04/07/21  1611 ? ? ?  ? ?History   ?Chief Complaint ?No chief complaint on file. ? ? ?HPI ?Tracy Medina is a 31 y.o. female.  ? ?HPI ?Patient presents today with 3 days of productive cough and discolored nasal drainage.  Patient has a history of asthma and typically experiences an exacerbation around this time of the year.  She also has levalbuterol nebulizer treatments at home which she uses if she develops asthma, chest tightness or shortness of breath.  She denies any other symptoms at present.  She is concerned that her cough has gotten deeper and is occurring more frequently.  Denies any known exposure to anyone positive for COVID or flu.  She has been afebrile. ? ?Past Medical History:  ?Diagnosis Date  ? Anxiety   ? Arthritis   ? Asthma   ? Depression   ? Endometriosis   ? ? ?Patient Active Problem List  ? Diagnosis Date Noted  ? Insomnia 05/24/2020  ? Elevated blood pressure reading in office without diagnosis of hypertension 05/24/2020  ? Endometriosis 02/29/2020  ? Iron deficiency 02/29/2020  ? Anxiety and depression 02/29/2020  ? Asthma 02/29/2020  ? OA (osteoarthritis of the spine) 02/29/2020  ? ? ?Past Surgical History:  ?Procedure Laterality Date  ? CHOLECYSTECTOMY    ? LAPAROSCOPY    ? TONSILLECTOMY AND ADENOIDECTOMY    ? TYMPANOSTOMY TUBE PLACEMENT    ? ? ?OB History   ? ? Gravida  ?0  ? Para  ?0  ? Term  ?0  ? Preterm  ?0  ? AB  ?0  ? Living  ?0  ?  ? ? SAB  ?0  ? IAB  ?0  ? Ectopic  ?0  ? Multiple  ?0  ? Live Births  ?0  ?   ?  ?  ? ? ? ?Home Medications   ? ?Prior to Admission medications   ?Medication Sig Start Date End Date Taking? Authorizing Provider  ?albuterol (VENTOLIN HFA) 108 (90 Base) MCG/ACT inhaler TAKE 2 PUFFS BY MOUTH EVERY 6 HOURS AS NEEDED FOR WHEEZE OR SHORTNESS OF BREATH 11/25/20   Lorre Munroe, NP  ?benzonatate (TESSALON) 100 MG capsule Take 1 capsule (100 mg total) by mouth every 8 (eight) hours. 02/21/21    Rhys Martini, PA-C  ?Butalbital-APAP-Caffeine 50-325-40 MG capsule Take 1-2 capsules by mouth every 6 (six) hours as needed for headache. 12/22/20   Copland, Ilona Sorrel, PA-C  ?Elagolix Sodium (ORILISSA) 150 MG TABS Take 150 mg by mouth daily. 12/22/20   Copland, Ilona Sorrel, PA-C  ?etonogestrel (NEXPLANON) 68 MG IMPL implant 1 each by Subdermal route once. 04/08/20 04/08/20  [provider]  ?FLOVENT HFA 44 MCG/ACT inhaler INHALE 2 PUFFS INTO THE LUNGS TWICE A DAY 11/30/20   Lorre Munroe, NP  ?FLUoxetine (PROZAC) 40 MG capsule Take 1 capsule (40 mg total) by mouth daily. 12/22/20   Lorre Munroe, NP  ?hydrOXYzine (ATARAX/VISTARIL) 25 MG tablet TAKE 1 TABLET BY MOUTH EVERY DAY AS NEEDED 08/12/20   Lorre Munroe, NP  ?levalbuterol (XOPENEX) 0.31 MG/3ML nebulizer solution Take 3 mLs (0.31 mg total) by nebulization every 4 (four) hours as needed for wheezing. 04/23/20   Lorre Munroe, NP  ?traZODone (DESYREL) 50 MG tablet Take 0.5-1 tablets (25-50 mg total) by mouth at bedtime as needed for sleep. 06/22/20   Lorre Munroe, NP  ? ? ?Family History ?  Family History  ?Adopted: Yes  ?Problem Relation Age of Onset  ? Hypertension Mother   ? ? ?Social History ?Social History  ? ?Tobacco Use  ? Smoking status: Never  ? Smokeless tobacco: Never  ?Vaping Use  ? Vaping Use: Never used  ?Substance Use Topics  ? Alcohol use: Yes  ?  Comment: social  ? Drug use: Never  ? ? ? ?Allergies   ?Cefaclor ? ?Review of Systems ?Review of Systems ?Pertinent negatives listed in HPI  ? ?Physical Exam ?Triage Vital Signs ?ED Triage Vitals  ?Enc Vitals Group  ?   BP   ?   Pulse   ?   Resp   ?   Temp   ?   Temp src   ?   SpO2   ?   Weight   ?   Height   ?   Head Circumference   ?   Peak Flow   ?   Pain Score   ?   Pain Loc   ?   Pain Edu?   ?   Excl. in GC?   ? ?No data found. ? ?Updated Vital Signs ?BP 136/66   Pulse 81   Temp 98.6 ?F (37 ?C) (Oral)   Resp 18   SpO2 99%  ? ?Visual Acuity ?Right Eye Distance:   ?Left Eye Distance:    ?Bilateral Distance:   ? ?Right Eye Near:   ?Left Eye Near:    ?Bilateral Near:    ? ?Physical Exam ? ?General Appearance:    Alert, cooperative, no distress  ?HENT:   Normocephalic, ears normal, nares mucosal edema with   congestion, rhinorrhea, oropharynx patent   ?Eyes:    PERRL, conjunctiva/corneas clear, EOM's intact       ?Lungs:     Clear to auscultation bilaterally, respirations unlabored  ?Heart:    Regular rate and rhythm  ?Neurologic:   Awake, alert, oriented x 3. No apparent focal neurological           defect.   ?  ? ?UC Treatments / Results  ?Labs ?(all labs ordered are listed, but only abnormal results are displayed) ?Labs Reviewed - No data to display ? ?EKG ? ? ?Radiology ?No results found. ? ?Procedures ?Procedures (including critical care time) ? ?Medications Ordered in UC ?Medications - No data to display ? ?Initial Impression / Assessment and Plan / UC Course  ?I have reviewed the triage vital signs and the nursing notes. ? ?Pertinent labs & imaging results that were available during my care of the patient were reviewed by me and considered in my medical decision making (see chart for details). ? ?  ?Reactive Airway Disease, with acute exacerbation  ?Prednisone 40 mg once daily for reactive airway symptoms.  Continue levo albuterol as needed for asthma symptoms.  Recommended starting a daily over-the-counter antihistamine to prevent symptoms of reactive airway. ?Strict follow-up precautions given if symptoms worsen or do not improve. ?Final Clinical Impressions(s) / UC Diagnoses  ? ?Final diagnoses:  ?Reactive airway disease with acute exacerbation, unspecified asthma severity, unspecified whether persistent  ? ? ? ?Discharge Instructions   ? ?  ?Start prednisone 40 mg once daily tomorrow for reactive airway exacerbation.  Recommend starting a daily antihistamine recommend over-the-counter cetirizine which is brand name of Zyrtec 10 mg once daily at bedtime or levocetirizine which brand name is  size all 5 mg daily at bedtime.  Continue your levo albuterol treatments at home.  If any your  symptoms worsen or do not rapidly improve return for evaluation. ? ? ? ?ED Prescriptions   ? ? Medication Sig Dispense Auth. Provider  ? predniSONE (DELTASONE) 20 MG tablet Take 2 tablets (40 mg total) by mouth daily with breakfast. 10 tablet Bing NeighborsHarris, Josclyn Rosales S, FNP  ? ?  ? ?PDMP not reviewed this encounter. ?  ?Bing NeighborsHarris, Vyom Brass S, FNP ?04/07/21 1758 ? ?

## 2021-04-07 NOTE — ED Triage Notes (Signed)
Pt presents with ongoing productive cough with colored mucus X 3 days. ?

## 2021-04-07 NOTE — Discharge Instructions (Addendum)
Start prednisone 40 mg once daily tomorrow for reactive airway exacerbation.  Recommend starting a daily antihistamine recommend over-the-counter cetirizine which is brand name of Zyrtec 10 mg once daily at bedtime or levocetirizine which brand name is size all 5 mg daily at bedtime.  Continue your levo albuterol treatments at home.  If any your symptoms worsen or do not rapidly improve return for evaluation.

## 2021-04-07 NOTE — Telephone Encounter (Signed)
Would recommend evisit with another [facility] provider. I can not work her in today. ?

## 2021-04-12 ENCOUNTER — Telehealth: Payer: 59 | Admitting: Family Medicine

## 2021-04-12 DIAGNOSIS — J45901 Unspecified asthma with (acute) exacerbation: Secondary | ICD-10-CM

## 2021-04-13 ENCOUNTER — Ambulatory Visit
Admission: RE | Admit: 2021-04-13 | Discharge: 2021-04-13 | Disposition: A | Discharge status: Home / Self Care | Payer: 59 | Source: Ambulatory Visit

## 2021-04-13 ENCOUNTER — Ambulatory Visit (INDEPENDENT_AMBULATORY_CARE_PROVIDER_SITE_OTHER): Payer: 59

## 2021-04-13 ENCOUNTER — Other Ambulatory Visit: Payer: Self-pay

## 2021-04-13 VITALS — BP 127/74 | HR 93 | Temp 99.3°F | Resp 18

## 2021-04-13 DIAGNOSIS — J45901 Unspecified asthma with (acute) exacerbation: Secondary | ICD-10-CM | POA: Diagnosis not present

## 2021-04-13 DIAGNOSIS — R0602 Shortness of breath: Secondary | ICD-10-CM | POA: Diagnosis not present

## 2021-04-13 DIAGNOSIS — Z9109 Other allergy status, other than to drugs and biological substances: Secondary | ICD-10-CM

## 2021-04-13 DIAGNOSIS — J45909 Unspecified asthma, uncomplicated: Secondary | ICD-10-CM | POA: Diagnosis not present

## 2021-04-13 MED ORDER — DOXYCYCLINE HYCLATE 100 MG PO CAPS: 100.0000 mg | ORAL_CAPSULE | Freq: Two times a day (BID) | ORAL | 0 refills | Status: AC

## 2021-04-13 MED ORDER — BENZONATATE 100 MG PO CAPS: 200.0000 mg | ORAL_CAPSULE | Freq: Three times a day (TID) | ORAL | 0 refills | Status: DC | PRN

## 2021-04-13 MED ORDER — LEVALBUTEROL HCL 0.31 MG/3ML IN NEBU: 1.0000 | INHALATION_SOLUTION | RESPIRATORY_TRACT | 0 refills | Status: DC | PRN

## 2021-04-13 MED ORDER — PREDNISONE 20 MG PO TABS: 20.0000 mg | ORAL_TABLET | Freq: Every day | ORAL | 0 refills | Status: AC

## 2021-04-13 MED ORDER — PROMETHAZINE-DM 6.25-15 MG/5ML PO SYRP: 5.0000 mL | ORAL_SOLUTION | Freq: Four times a day (QID) | ORAL | 0 refills | Status: DC | PRN

## 2021-04-13 NOTE — Progress Notes (Signed)
[place]  ? ?Was seen in person on 3/17; treated for asthma with nebs and pred; not improved. ?Advised in person visit for follow up. ? ?Message sent via evisit note ?

## 2021-04-13 NOTE — ED Provider Notes (Signed)
?[facility] ? ? ? ?CSN: [id number] ?Arrival date & time: 04/13/21  1608 ? ? ?  ? ?History   ?Chief Complaint ?Chief Complaint  ?Patient presents with  ? Cough  ?  Have bad [id number] with cough was [facility] and was prescribed prednisone which I finished 2 days ago and I am still not better. I ran out of my levalbuterol medication. - Entered by patient  ? Wheezing  ? ? ?HPI ?Tracy Medina is a 31 y.o. female.  ? ?HPI ?Patient seen today for evaluation of ongoing cough and wheezing.  Patient was [facility] a [facility] for evaluation of asthma exacerbation.  She was treated with a course of prednisone and reports that symptoms did not improve.  She used her last nebulizer treatment earlier this morning.  She reports she still feels that she is unable to exchange air effectively.  She is afebrile and denies any known sick contacts.  She endorses that she has taken a daily allergy medication. ?Past Medical History:  ?Diagnosis Date  ? Anxiety   ? Arthritis   ? Asthma   ? Depression   ? Endometriosis   ? ? ?Patient Active Problem List  ? Diagnosis Date Noted  ? Insomnia 05/24/2020  ? Elevated blood pressure reading in office without diagnosis of hypertension 05/24/2020  ? Endometriosis 02/29/2020  ? Iron deficiency 02/29/2020  ? Anxiety and depression 02/29/2020  ? Asthma 02/29/2020  ? OA (osteoarthritis of the spine) 02/29/2020  ? ? ?Past Surgical History:  ?Procedure Laterality Date  ? CHOLECYSTECTOMY    ? LAPAROSCOPY    ? TONSILLECTOMY AND ADENOIDECTOMY    ? TYMPANOSTOMY TUBE PLACEMENT    ? ? ?OB History   ? ? Gravida  ?0  ? Para  ?0  ? Term  ?0  ? Preterm  ?0  ? AB  ?0  ? Living  ?0  ?  ? ? SAB  ?0  ? IAB  ?0  ? Ectopic  ?0  ? Multiple  ?0  ? Live Births  ?0  ?   ?  ?  ? ? ? ?Home Medications   ? ?Prior to Admission medications   ?Medication Sig Start Date End Date Taking? Authorizing Provider  ?albuterol (VENTOLIN HFA) 108 (90 Base) MCG/ACT inhaler  TAKE 2 PUFFS BY MOUTH EVERY 6 HOURS AS NEEDED FOR WHEEZE OR SHORTNESS OF BREATH 11/25/20  Yes Baity, Salvadore Oxfordegina W, NP  ?benzonatate (TESSALON) 100 MG capsule Take 1 capsule (100 mg total) by mouth every 8 (eight) hours. 02/21/21  Yes Rhys MartiniGraham, Laura E, PA-C  ?Butalbital-APAP-Caffeine 50-325-40 MG capsule Take 1-2 capsules by mouth every 6 (six) hours as needed for headache. 12/22/20  Yes Copland, Ilona SorrelAlicia B, PA-C  ?Elagolix Sodium (ORILISSA) 150 MG TABS Take 150 mg by mouth daily. 12/22/20  Yes Copland, Ilona SorrelAlicia B, PA-C  ?etonogestrel (NEXPLANON) 68 MG IMPL implant 1 each by Subdermal route once. 04/08/20 04/13/21 Yes [provider]  ?FLOVENT HFA 44 MCG/ACT inhaler INHALE 2 PUFFS INTO THE LUNGS TWICE A DAY 11/30/20  Yes Baity, Salvadore Oxfordegina W, NP  ?FLUoxetine (PROZAC) 40 MG capsule Take 1 capsule (40 mg total) by mouth daily. 12/22/20  Yes Lorre MunroeBaity, Regina W, NP  ?hydrOXYzine (ATARAX/VISTARIL) 25 MG tablet TAKE 1 TABLET BY MOUTH EVERY DAY AS NEEDED 08/12/20  Yes Lorre MunroeBaity, Regina W, NP  ?levalbuterol (XOPENEX) 0.31 MG/3ML nebulizer solution Take 3 mLs (0.31 mg total) by nebulization every  4 (four) hours as needed for wheezing. 04/23/20  Yes Lorre Munroe, NP  ?traZODone (DESYREL) 50 MG tablet Take 0.5-1 tablets (25-50 mg total) by mouth at bedtime as needed for sleep. 06/22/20  Yes Lorre Munroe, NP  ?predniSONE (DELTASONE) 20 MG tablet Take 2 tablets (40 mg total) by mouth daily with breakfast. 04/07/21   Bing Neighbors, FNP  ?triazolam (HALCION) 0.25 MG tablet SMARTSIG:2 Tablet(s) By Mouth 03/23/21   [provider]  ? ? ?Family History ?Family History  ?Adopted: Yes  ?Problem Relation Age of Onset  ? Hypertension Mother   ? ? ?Social History ?Social History  ? ?Tobacco Use  ? Smoking status: Never  ? Smokeless tobacco: Never  ?Vaping Use  ? Vaping Use: Never used  ?Substance Use Topics  ? Alcohol use: Yes  ?  Comment: social  ? Drug use: Never  ? ? ? ?Allergies   ?Cefaclor ? ? ?Review of Systems ?Review of Systems ?Pertinent  negatives listed in HPI  ? ?Physical Exam ?Triage Vital Signs ?ED Triage Vitals [04/13/21 1743]  ?Enc Vitals Group  ?   BP 127/74  ?   Pulse Rate 93  ?   Resp 18  ?   Temp 99.3 ?F (37.4 ?C)  ?   Temp Source Oral  ?   SpO2 98 %  ?   Weight   ?   Height   ?   Head Circumference   ?   Peak Flow   ?   Pain Score   ?   Pain Loc   ?   Pain Edu?   ?   Excl. in GC?   ? ?No data found. ? ?Updated Vital Signs ?BP 127/74 (BP Location: Right Arm)   Pulse 93   Temp 99.3 ?F (37.4 ?C) (Oral)   Resp 18   SpO2 98%  ? ?Visual Acuity ?Right Eye Distance:   ?Left Eye Distance:   ?Bilateral Distance:   ? ?Right Eye Near:   ?Left Eye Near:    ?Bilateral Near:    ? ?Physical Exam ?Constitutional:   ?   Appearance: She is obese.  ?HENT:  ?   Head: Normocephalic and atraumatic.  ?   Nose: Nose normal.  ?Cardiovascular:  ?   Rate and Rhythm: Normal rate and regular rhythm.  ?Pulmonary:  ?   Breath sounds: Rhonchi present.  ?   Comments: Persistent croupy coughing ?Skin: ?   General: Skin is warm and dry.  ?Neurological:  ?   General: No focal deficit present.  ?   Mental Status: She is alert and oriented to person, place, and time.  ?Psychiatric:     ?   Mood and Affect: Mood normal.     ?   Behavior: Behavior normal.  ? ? ? ?UC Treatments / Results  ?Labs ?(all labs ordered are listed, but only abnormal results are displayed) ?Labs Reviewed - No data to display ? ?EKG ? ? ?Radiology ?No results found. ? ?Procedures ?Procedures (including critical care time) ? ?Medications Ordered in UC ?Medications - No data to display ? ?Initial Impression / Assessment and Plan / UC Course  ?I have reviewed the triage vital signs and the nursing notes. ? ?Pertinent labs & imaging results that were available during my care of the patient were reviewed by me and considered in my medical decision making (see chart for details). ? ?  ?Chest x-ray is negative.  Treating for an acute asthma exacerbation.  Refilled levaalbuterol and advised to continue  treatments.  Agreed to trial an antibiotic as patient did not achieve any relief from prednisone and suspect patient may have an underlying bronchial versus sinus infection going on.  Prednisone 20 mg opposed to 40 mg prescribed to help fluid chest inflammation.  Refilled benzonatate.  Strict return precautions given if symptoms worsen or do not readily improve. ? ? ? ?Final Clinical Impressions(s) / UC Diagnoses  ? ?Final diagnoses:  ?Asthma with acute exacerbation, unspecified asthma severity, unspecified whether persistent  ? ?Discharge Instructions   ?None ?  ? ?ED Prescriptions   ? ? Medication Sig Dispense Auth. Provider  ? levalbuterol (XOPENEX) 0.31 MG/3ML nebulizer solution Take 3 mLs (0.31 mg total) by nebulization every 4 (four) hours as needed for wheezing or shortness of breath. 150 mL Bing Neighbors, FNP  ? promethazine-dextromethorphan (PROMETHAZINE-DM) 6.25-15 MG/5ML syrup Take 5 mLs by mouth 4 (four) times daily as needed for cough. 180 mL Bing Neighbors, FNP  ? predniSONE (DELTASONE) 20 MG tablet Take 1 tablet (20 mg total) by mouth daily with breakfast for 5 days. 5 tablet Bing Neighbors, FNP  ? doxycycline (VIBRAMYCIN) 100 MG capsule Take 1 capsule (100 mg total) by mouth 2 (two) times daily for 7 days. 14 capsule Bing Neighbors, FNP  ? benzonatate (TESSALON) 100 MG capsule Take 2 capsules (200 mg total) by mouth 3 (three) times daily as needed for cough. 80 capsule Bing Neighbors, FNP  ? ?  ? ?PDMP not reviewed this encounter. ?  ?Bing Neighbors, FNP ?04/13/21 1856 ? ?

## 2021-04-13 NOTE — ED Triage Notes (Signed)
Pt presents with cough, SOB, and wheezing for over a week.  ?

## 2021-05-05 ENCOUNTER — Other Ambulatory Visit: Payer: Self-pay | Admitting: Internal Medicine

## 2021-05-05 DIAGNOSIS — Z9109 Other allergy status, other than to drugs and biological substances: Secondary | ICD-10-CM

## 2021-05-08 NOTE — Telephone Encounter (Signed)
Requested medication (s) are due for refill today: - ? ?Requested medication (s) are on the active medication list: yes ? ?Last refill:  04/13/21 ? ?Future visit scheduled: yes ? ?Notes to clinic:  was prescribed by telehealth ? ? ?Requested Prescriptions  ?Pending Prescriptions Disp Refills  ? levalbuterol (XOPENEX) 0.31 MG/3ML nebulizer solution [Pharmacy Med Name: LEVALBUTEROL 0.31 MG/3 ML SOL] 90 mL 1  ?  Sig: TAKE 3 MLS (0.31 MG TOTAL) BY NEBULIZATION EVERY 4 (FOUR) HOURS AS NEEDED FOR WHEEZING.  ?  ? There is no refill protocol information for this order  ?  ? ? ? ? ?

## 2021-05-30 ENCOUNTER — Ambulatory Visit: Payer: 59 | Admitting: Internal Medicine

## 2021-05-30 ENCOUNTER — Encounter: Payer: Self-pay | Admitting: Internal Medicine

## 2021-05-30 VITALS — BP 126/84 | HR 95 | Temp 97.7°F | Ht 67.0 in | Wt 259.0 lb

## 2021-05-30 DIAGNOSIS — Z0001 Encounter for general adult medical examination with abnormal findings: Secondary | ICD-10-CM | POA: Diagnosis not present

## 2021-05-30 DIAGNOSIS — E66813 Obesity, class 3: Secondary | ICD-10-CM | POA: Insufficient documentation

## 2021-05-30 DIAGNOSIS — Z6841 Body Mass Index (BMI) 40.0 and over, adult: Secondary | ICD-10-CM | POA: Insufficient documentation

## 2021-05-30 NOTE — Progress Notes (Signed)
? ?Subjective:  ? ? Patient ID: Tracy Medina, female    DOB: 07-20-1990, 31 y.o.   MRN: [id number] ? ?HPI ? ?Patient presents to clinic today for her annual exam. ? ?Flu: 10/2020 ?Tetanus: 05/2020 ?COVID: Pfizer x3 ?Pap smear: > 5 years ?Dentist: biannually ? ?Diet: She does eat meat. She consumes some fruits and veggies. She does eat some fried foods. She drinks mostly Crystal Light. ?Exercise: None ? ?Review of Systems ? ?Past Medical History:  ?Diagnosis Date  ? Anxiety   ? Arthritis   ? Asthma   ? Depression   ? Endometriosis   ? ? ?Current Outpatient Medications  ?Medication Sig Dispense Refill  ? albuterol (VENTOLIN HFA) 108 (90 Base) MCG/ACT inhaler TAKE 2 PUFFS BY MOUTH EVERY 6 HOURS AS NEEDED FOR WHEEZE OR SHORTNESS OF BREATH 8.5 each 0  ? benzonatate (TESSALON) 100 MG capsule Take 2 capsules (200 mg total) by mouth 3 (three) times daily as needed for cough. 80 capsule 0  ? Butalbital-APAP-Caffeine 50-325-40 MG capsule Take 1-2 capsules by mouth every 6 (six) hours as needed for headache. 20 capsule 0  ? Elagolix Sodium (ORILISSA) 150 MG TABS Take 150 mg by mouth daily. 30 tablet 2  ? etonogestrel (NEXPLANON) 68 MG IMPL implant 1 each by Subdermal route once.    ? FLOVENT HFA 44 MCG/ACT inhaler INHALE 2 PUFFS INTO THE LUNGS TWICE A DAY 10.6 each 1  ? FLUoxetine (PROZAC) 40 MG capsule Take 1 capsule (40 mg total) by mouth daily. 90 capsule 2  ? hydrOXYzine (ATARAX/VISTARIL) 25 MG tablet TAKE 1 TABLET BY MOUTH EVERY DAY AS NEEDED 90 tablet 0  ? levalbuterol (XOPENEX) 0.31 MG/3ML nebulizer solution TAKE 3 MLS (0.31 MG TOTAL) BY NEBULIZATION EVERY 4 (FOUR) HOURS AS NEEDED FOR WHEEZING. 90 mL 1  ? promethazine-dextromethorphan (PROMETHAZINE-DM) 6.25-15 MG/5ML syrup Take 5 mLs by mouth 4 (four) times daily as needed for cough. 180 mL 0  ? traZODone (DESYREL) 50 MG tablet Take 0.5-1 tablets (25-50 mg total) by mouth at bedtime as needed for sleep. 30 tablet 0  ? triazolam (HALCION) 0.25 MG tablet SMARTSIG:2  Tablet(s) By Mouth    ? ?No current facility-administered medications for this visit.  ? ? ?Allergies  ?Allergen Reactions  ? Cefaclor Hives and Rash  ? ? ?Family History  ?Adopted: Yes  ?Problem Relation Age of Onset  ? Hypertension Mother   ? ? ?Social History  ? ?Socioeconomic History  ? Marital status: Single  ?  Spouse name: Not on file  ? Number of children: Not on file  ? Years of education: Not on file  ? Highest education level: Not on file  ?Occupational History  ? Not on file  ?Tobacco Use  ? Smoking status: Never  ? Smokeless tobacco: Never  ?Vaping Use  ? Vaping Use: Never used  ?Substance and Sexual Activity  ? Alcohol use: Yes  ?  Comment: social  ? Drug use: Never  ? Sexual activity: Not Currently  ?  Birth control/protection: Pill, Implant  ?Other Topics Concern  ? Not on file  ?Social History Narrative  ? Not on file  ? ?Social Determinants of Health  ? ?Financial Resource Strain: Not on file  ?Food Insecurity: Not on file  ?Transportation Needs: Not on file  ?Physical Activity: Not on file  ?Stress: Not on file  ?Social Connections: Not on file  ?Intimate Partner Violence: Not on file  ? ? ? ?Constitutional: Denies fever, malaise, fatigue, headache or abrupt  weight changes.  ?HEENT: Denies eye pain, eye redness, ear pain, ringing in the ears, wax buildup, runny nose, nasal congestion, bloody nose, or sore throat. ?Respiratory: Denies difficulty breathing, shortness of breath, cough or sputum production.   ?Cardiovascular: Denies chest pain, chest tightness, palpitations or swelling in the hands or feet.  ?Gastrointestinal: Denies abdominal pain, bloating, constipation, diarrhea or blood in the stool.  ?GU: Denies urgency, frequency, pain with urination, burning sensation, blood in urine, odor or discharge. ?Musculoskeletal: Patient reports joint pain.  Denies decrease in range of motion, difficulty with gait, muscle pain or joint swelling.  ?Skin: Denies redness, rashes, lesions or ulcercations.   ?Neurological: Patient reports insomnia.  Denies dizziness, difficulty with memory, difficulty with speech or problems with balance and coordination.  ?Psych: Patient has a history of anxiety and depression.  Denies SI/HI. ? ?No other specific complaints in a complete review of systems (except as listed in HPI above). ? ?   ?Objective:  ? Physical Exam ? ? ?BP 126/84 (BP Location: Right Arm, Patient Position: Sitting, Cuff Size: Large)   Pulse 95   Temp 97.7 ?F (36.5 ?C) (Temporal)   Ht '5\' 7"'  (1.702 m)   Wt 259 lb (117.5 kg)   SpO2 97%   BMI 40.57 kg/m?  ? ?Wt Readings from Last 3 Encounters:  ?12/22/20 276 lb (125.2 kg)  ?06/17/20 271 lb 6.4 oz (123.1 kg)  ?05/24/20 269 lb 9.6 oz (122.3 kg)  ? ? ?General: Appears her stated age, obese, in NAD. ?Skin: Warm, dry and intact.  ?HEENT: Head: normal shape and size; Eyes: sclera white, no icterus, conjunctiva pink, PERRLA and EOMs intact;  ?Neck:  Neck supple, trachea midline. No masses, lumps or thyromegaly present.  ?Cardiovascular: Normal rate and rhythm. S1,S2 noted.  No murmur, rubs or gallops noted. No JVD or BLE edema.  ?Pulmonary/Chest: Normal effort and positive vesicular breath sounds. No respiratory distress. No wheezes, rales or ronchi noted.  ?Abdomen: Soft and nontender. Normal bowel sounds.  ?Musculoskeletal: Strength 5/5 BUE/BLE.  No difficulty with gait.  ?Neurological: Alert and oriented. Cranial nerves II-XII grossly intact. Coordination normal.  ?Psychiatric: Mood and affect normal. Behavior is normal. Judgment and thought content normal.  ? ?BMET ?   ?Component Value Date/Time  ? NA 138 05/24/2020 0922  ? K 4.2 05/24/2020 0922  ? CL 104 05/24/2020 0922  ? CO2 24 05/24/2020 0922  ? GLUCOSE 88 05/24/2020 0922  ? BUN 9 05/24/2020 0922  ? CREATININE 0.97 05/24/2020 0922  ? CALCIUM 9.8 05/24/2020 0922  ? ? ?Lipid Panel  ?   ?Component Value Date/Time  ? CHOL 160 05/24/2020 0922  ? TRIG 77 05/24/2020 0922  ? HDL 48 (L) 05/24/2020 7121  ? CHOLHDL 3.3  05/24/2020 0922  ? [place] 95 05/24/2020 0922  ? ? ?CBC ?   ?Component Value Date/Time  ? WBC 9.6 01/25/2020 1144  ? RBC 4.78 01/25/2020 1144  ? HGB 13.2 01/25/2020 1144  ? HCT 40.1 01/25/2020 1144  ? PLT 350.0 01/25/2020 1144  ? MCV 83.9 01/25/2020 1144  ? MCHC 32.8 01/25/2020 1144  ? RDW 14.1 01/25/2020 1144  ? ? ?Hgb A1C ?Lab Results  ?Component Value Date  ? HGBA1C 5.4 01/25/2020  ? ? ? ? ? ? ?   ?Assessment & Plan:  ? ?Preventative Health Maintenance: ? ?Encouraged her to get a flu shot in the fall ?Tetanus UTD ?Encouraged her to get her COVID booster ?She has seen GYN in the past for Pap  smear, unsuccessful despite sedation ?Encouraged her to consume a balanced diet and exercise regimen ?Advised her to see a dentist annually ?We will check CBC, c-Met, lipid and A1c today ? ?RTC in 6 months, follow-up chronic conditions ?Webb Silversmith, NP ? ? ?Webb Silversmith, NP ? ?

## 2021-05-30 NOTE — Patient Instructions (Signed)

## 2021-05-30 NOTE — Assessment & Plan Note (Signed)
Encourage diet and exercise for weight loss 

## 2021-05-31 LAB — COMPLETE METABOLIC PANEL WITH GFR
AG Ratio: 1.6 (calc) (ref 1.0–2.5)
ALT: 11 U/L (ref 6–29)
AST: 14 U/L (ref 10–30)
Albumin: 4.4 g/dL (ref 3.6–5.1)
Alkaline phosphatase (APISO): 105 U/L (ref 31–125)
BUN: 7 mg/dL (ref 7–25)
CO2: 27 mmol/L (ref 20–32)
Calcium: 9.5 mg/dL (ref 8.6–10.2)
Chloride: 102 mmol/L (ref 98–110)
Creat: 0.84 mg/dL (ref 0.50–0.97)
Globulin: 2.7 g/dL (calc) (ref 1.9–3.7)
Glucose, Bld: 86 mg/dL (ref 65–139)
Potassium: 5 mmol/L (ref 3.5–5.3)
Sodium: 137 mmol/L (ref 135–146)
Total Bilirubin: 0.3 mg/dL (ref 0.2–1.2)
Total Protein: 7.1 g/dL (ref 6.1–8.1)
eGFR: 95 mL/min/{1.73_m2} (ref >=60)

## 2021-05-31 LAB — LIPID PANEL
Cholesterol: 153 mg/dL (ref <200)
HDL: 57 mg/dL (ref >=50)
LDL Cholesterol (Calc): 81 mg/dL (calc)
Non-HDL Cholesterol (Calc): 96 mg/dL (calc) (ref <130)
Total CHOL/HDL Ratio: 2.7 (calc) (ref <5.0)
Triglycerides: 74 mg/dL (ref <150)

## 2021-05-31 LAB — CBC
HCT: 41.6 % (ref 35.0–45.0)
Hemoglobin: 13.5 g/dL (ref 11.7–15.5)
MCH: 29 pg (ref 27.0–33.0)
MCHC: 32.5 g/dL (ref 32.0–36.0)
MCV: 89.3 fL (ref 80.0–100.0)
MPV: 10.3 fL (ref 7.5–12.5)
Platelets: 347 10*3/uL (ref 140–400)
RBC: 4.66 10*6/uL (ref 3.80–5.10)
RDW: 13.3 % (ref 11.0–15.0)
WBC: 8.4 10*3/uL (ref 3.8–10.8)

## 2021-05-31 LAB — HEMOGLOBIN A1C
Hgb A1c MFr Bld: 5.1 % of total Hgb (ref <5.7)
Mean Plasma Glucose: 100 mg/dL
eAG (mmol/L): 5.5 mmol/L

## 2021-06-09 ENCOUNTER — Encounter: Payer: Self-pay | Admitting: Internal Medicine

## 2021-06-10 ENCOUNTER — Emergency Department
Admission: EM | Admit: 2021-06-10 | Discharge: 2021-06-10 | Disposition: A | Discharge status: Home / Self Care | Payer: 59 | Attending: Emergency Medicine | Admitting: Emergency Medicine

## 2021-06-10 ENCOUNTER — Emergency Department: Payer: 59

## 2021-06-10 ENCOUNTER — Ambulatory Visit
Admission: RE | Admit: 2021-06-10 | Discharge: 2021-06-10 | Disposition: A | Discharge status: Home / Self Care | Payer: 59 | Source: Ambulatory Visit | Attending: Emergency Medicine | Admitting: Emergency Medicine

## 2021-06-10 ENCOUNTER — Other Ambulatory Visit: Payer: Self-pay

## 2021-06-10 VITALS — BP 128/88 | HR 103 | Temp 98.1°F | Resp 18

## 2021-06-10 DIAGNOSIS — M545 Low back pain, unspecified: Secondary | ICD-10-CM

## 2021-06-10 DIAGNOSIS — N83201 Unspecified ovarian cyst, right side: Secondary | ICD-10-CM | POA: Diagnosis not present

## 2021-06-10 DIAGNOSIS — R8289 Other abnormal findings on cytological and histological examination of urine: Secondary | ICD-10-CM | POA: Insufficient documentation

## 2021-06-10 DIAGNOSIS — N809 Endometriosis, unspecified: Secondary | ICD-10-CM | POA: Diagnosis not present

## 2021-06-10 LAB — COMPREHENSIVE METABOLIC PANEL
ALT: 14 U/L (ref 0–44)
AST: 17 U/L (ref 15–41)
Albumin: 3.8 g/dL (ref 3.5–5.0)
Alkaline Phosphatase: 81 U/L (ref 38–126)
Anion gap: 6 (ref 5–15)
BUN: 12 mg/dL (ref 6–20)
CO2: 25 mmol/L (ref 22–32)
Calcium: 9.1 mg/dL (ref 8.9–10.3)
Chloride: 107 mmol/L (ref 98–111)
Creatinine, Ser: 0.81 mg/dL (ref 0.44–1.00)
GFR, Estimated: >60 mL/min (ref >60)
Glucose, Bld: 93 mg/dL (ref 70–99)
Potassium: 4.3 mmol/L (ref 3.5–5.1)
Sodium: 138 mmol/L (ref 135–145)
Total Bilirubin: 0.4 mg/dL (ref 0.3–1.2)
Total Protein: 7 g/dL (ref 6.5–8.1)

## 2021-06-10 LAB — URINALYSIS, ROUTINE W REFLEX MICROSCOPIC
Bilirubin Urine: NEGATIVE
Glucose, UA: NEGATIVE mg/dL
Ketones, ur: NEGATIVE mg/dL
Leukocytes,Ua: NEGATIVE
Nitrite: NEGATIVE
Protein, ur: NEGATIVE mg/dL
Specific Gravity, Urine: 1.019 (ref 1.005–1.030)
WBC, UA: >50 WBC/hpf — ABNORMAL HIGH (ref 0–5)
pH: 6 (ref 5.0–8.0)

## 2021-06-10 LAB — CBC WITH DIFFERENTIAL/PLATELET
Abs Immature Granulocytes: 0.04 10*3/uL (ref 0.00–0.07)
Basophils Absolute: 0.1 10*3/uL (ref 0.0–0.1)
Basophils Relative: 1 %
Eosinophils Absolute: 0.2 10*3/uL (ref 0.0–0.5)
Eosinophils Relative: 2 %
HCT: 39.7 % (ref 36.0–46.0)
Hemoglobin: 12.6 g/dL (ref 12.0–15.0)
Immature Granulocytes: 1 %
Lymphocytes Relative: 28 %
Lymphs Abs: 2.4 10*3/uL (ref 0.7–4.0)
MCH: 28.6 pg (ref 26.0–34.0)
MCHC: 31.7 g/dL (ref 30.0–36.0)
MCV: 90.2 fL (ref 80.0–100.0)
Monocytes Absolute: 0.7 10*3/uL (ref 0.1–1.0)
Monocytes Relative: 8 %
Neutro Abs: 5.1 10*3/uL (ref 1.7–7.7)
Neutrophils Relative %: 60 %
Platelets: 362 10*3/uL (ref 150–400)
RBC: 4.4 MIL/uL (ref 3.87–5.11)
RDW: 13.3 % (ref 11.5–15.5)
WBC: 8.5 10*3/uL (ref 4.0–10.5)
nRBC: 0 % (ref 0.0–0.2)

## 2021-06-10 LAB — PREGNANCY, URINE: Preg Test, Ur: NEGATIVE

## 2021-06-10 MED ORDER — SULFAMETHOXAZOLE-TRIMETHOPRIM 800-160 MG PO TABS
1.0000 | ORAL_TABLET | Freq: Once | ORAL | Status: AC
  Administered 2021-06-10: 1 via ORAL
  Filled 2021-06-10: qty 1

## 2021-06-10 MED ORDER — FLUCONAZOLE 150 MG PO TABS: 150.0000 mg | ORAL_TABLET | Freq: Every day | ORAL | 0 refills | Status: AC

## 2021-06-10 MED ORDER — KETOROLAC TROMETHAMINE 30 MG/ML IJ SOLN
30.0000 mg | Freq: Once | INTRAMUSCULAR | Status: AC
  Administered 2021-06-10: 30 mg via INTRAMUSCULAR
  Filled 2021-06-10: qty 1

## 2021-06-10 MED ORDER — SULFAMETHOXAZOLE-TRIMETHOPRIM 800-160 MG PO TABS: 1.0000 | ORAL_TABLET | Freq: Two times a day (BID) | ORAL | 0 refills | Status: AC

## 2021-06-10 MED ORDER — CYCLOBENZAPRINE HCL 10 MG PO TABS: 10.0000 mg | ORAL_TABLET | Freq: Two times a day (BID) | ORAL | 0 refills | Status: DC | PRN

## 2021-06-10 MED ORDER — OXYCODONE-ACETAMINOPHEN 5-325 MG PO TABS
1.0000 | ORAL_TABLET | Freq: Once | ORAL | Status: AC
  Administered 2021-06-10: 1 via ORAL
  Filled 2021-06-10: qty 1

## 2021-06-10 MED ORDER — ONDANSETRON 4 MG PO TBDP
4.0000 mg | ORAL_TABLET | Freq: Once | ORAL | Status: AC
  Administered 2021-06-10: 4 mg via ORAL
  Filled 2021-06-10: qty 1

## 2021-06-10 NOTE — Discharge Instructions (Addendum)
Take Bactrim twice daily for the next 7 days. You can make a follow-up appointment with Dr. Valentino Saxon if you do not have an established OB/GYN.

## 2021-06-10 NOTE — ED Triage Notes (Signed)
This episode of back pain has been for 2 days.  Patient relates this to her endometriosis.  Patient reports she cannot sit ar bend over.  Has shooting pain in lower back if she does anything but stand.  Menstrual cycle is due-spotting 2 days ago.  Denies any known injury

## 2021-06-10 NOTE — ED Notes (Signed)
Pt assisted to bathroom using wheelchair as stated thinks can urinate now; pt needed lots of time and significant help to get into wheelchair due to back pain. Visitor remains at bedside. Pt to pull call bell in restroom once done providing urine sample. Hat in toilet. Pt educated.

## 2021-06-10 NOTE — Discharge Instructions (Addendum)
Go to the emergency department if you have uncontrolled pain.    Take ibuprofen as needed for discomfort.  Take the muscle relaxer as needed for muscle spasm; Do not drive, operate machinery, or drink alcohol with this medication as it can cause drowsiness.   Follow up with your primary care provider or gynecologist on Monday.

## 2021-06-10 NOTE — ED Notes (Addendum)
See triage note. Lower back pain started about 3 days ago but not to this extent until today; muscle relaxers haven't helped pt as prescribed by urgent care recently. Stated urinated a normal amount this morning; denies burning upon urination; denies fever; denies injury to back; denies history of kidney stones; gallbladder removed in 2017. Pt sitting calmly on stretcher and resp reg/unlabored. Pt denies any chance of pregnancy. Denies abdominal distention, tenderness, nausea. States decreased urination but also decreased consumption of fluids. Reports anxiety trying to urinate due to back pain.

## 2021-06-10 NOTE — ED Provider Notes (Signed)
[facility] Provider Note  Patient Contact: 7:13 PM (approximate)   History   Back Pain   HPI  Tracy Medina is a 31 y.o. female with a history of endometriosis, depression and anxiety presents to the emergency department with low back pain.  Patient states that she has experienced similar low back pain in the past that she associates with endometriosis.  Patient states that she has not had care yet with her OB/GYN.  She states that she has had some urinary hesitancy and became concerned.  No numbness or tingling of the lower extremities or weakness.  No saddle anesthesia.  Patient states that she has had urinary hesitancy for several years and this is not a new symptom for her.  She states that she is due to start her menstrual cycle and has had some mild hematuria that may be associated with her cycle.  No chest pain, chest tightness or shortness of breath.       Physical Exam   Triage Vital Signs: ED Triage Vitals  Enc Vitals Group     BP 06/10/21 1746 (!) 143/83     Pulse Rate 06/10/21 1746 73     Resp 06/10/21 1746 20     Temp 06/10/21 1746 98.6 F (37 C)     Temp Source 06/10/21 1746 Oral     SpO2 06/10/21 1746 97 %     Weight 06/10/21 1745 230 lb (104.3 kg)     Height 06/10/21 1745 5\' 7"  (1.702 m)     Head Circumference --      Peak Flow --      Pain Score 06/10/21 1745 9     Pain Loc --      Pain Edu? --      Excl. in [place]? --     Most recent vital signs: Vitals:   06/10/21 2127 06/10/21 2133  BP: 123/70   Pulse: 70   Resp:  18  Temp:    SpO2: 100%      General: Alert and in no acute distress. Eyes:  PERRL. EOMI. Head: No acute traumatic findings ENT:      Nose: No congestion/rhinnorhea.      Mouth/Throat: Mucous membranes are moist.  Neck: No stridor. No cervical spine tenderness to palpation. Cardiovascular:  Good peripheral perfusion Respiratory: Normal respiratory effort without tachypnea or retractions. Lungs CTAB.  Good air entry to the bases with no decreased or absent breath sounds. Gastrointestinal: Bowel sounds 4 quadrants. Soft and nontender to palpation. No guarding or rigidity. No palpable masses. No distention. No CVA tenderness. Musculoskeletal: Full range of motion to all extremities.  Neurologic:  No gross focal neurologic deficits are appreciated.  Skin:   No rash noted   ED Results / Procedures / Treatments   Labs (all labs ordered are listed, but only abnormal results are displayed) Labs Reviewed  URINALYSIS, ROUTINE W REFLEX MICROSCOPIC - Abnormal; Notable for the following components:      Result Value   Color, Urine YELLOW (*)    APPearance HAZY (*)    Hgb urine dipstick MODERATE (*)    WBC, UA >50 (*)    Bacteria, UA MANY (*)    All other components within normal limits  CBC WITH DIFFERENTIAL/PLATELET  COMPREHENSIVE METABOLIC PANEL  PREGNANCY, URINE        RADIOLOGY  I personally viewed and evaluated these images as part of my medical decision making, as well as reviewing the written report by the  radiologist.  ED Provider Interpretation: I personally interpreted pelvic ultrasound and patient has a 3.2 cm right sided simple cyst but no other acute abnormality.   PROCEDURES:  Critical Care performed: No  Procedures   MEDICATIONS ORDERED IN ED: Medications  oxyCODONE-acetaminophen (PERCOCET/ROXICET) 5-325 MG per tablet 1 tablet (1 tablet Oral Given 06/10/21 1941)  ondansetron (ZOFRAN-ODT) disintegrating tablet 4 mg (4 mg Oral Given 06/10/21 1942)  ketorolac (TORADOL) 30 MG/ML injection 30 mg (30 mg Intramuscular Given 06/10/21 2128)  sulfamethoxazole-trimethoprim (BACTRIM DS) 800-160 MG per tablet 1 tablet (1 tablet Oral Given 06/10/21 2122)     IMPRESSION / MDM / ASSESSMENT AND PLAN / ED COURSE  I reviewed the triage vital signs and the nursing notes.                              Assessment and plan: Low back pain:  Differential diagnosis included UTI,  endometriosis, ovarian cyst, hemorrhagic ovarian cyst... 31 year old female presents to the emergency department with concern for resurgence of her endometriosis type pain.  Vital signs are reassuring at triage.  On exam, patient was alert, active and nontoxic-appearing.  Abdomen was soft and nontender.  Patient was able to void easily in the emergency department.  Urinalysis was concerning for UTI with many bacteria, moderate amount of blood and greater than 50 white blood cells.  Urine culture in process.  CBC and CMP unremarkable.  Dedicated pelvic ultrasound showed right-sided 3.2 cm simple cyst but no other acute abnormality.  Urine pregnancy test was negative.  Patient was started on Bactrim given cefaclor allergy.  Patient was also given a dose of Diflucan.  Return precautions were given to return with new or worsening symptoms.   FINAL CLINICAL IMPRESSION(S) / ED DIAGNOSES   Final diagnoses:  Acute bilateral low back pain without sciatica     Rx / DC Orders   ED Discharge Orders          Ordered    sulfamethoxazole-trimethoprim (BACTRIM DS) 800-160 MG tablet  2 times daily        06/10/21 2112    fluconazole (DIFLUCAN) 150 MG tablet  Daily        06/10/21 2114             Note:  This document was prepared using Dragon voice recognition software and may include unintentional dictation errors.   Vallarie Mare [place], PA-C 06/10/21 2155    Naaman Plummer, MD 06/13/21 [phone]

## 2021-06-10 NOTE — ED Triage Notes (Signed)
Pt having lower back pain x2 days- pt states it feels similar to the pain she had before she had her endometriosis surgery- pt denies any recent injury or heavy lifting- pt was seen at [facility] for same and given a muscle relaxer and states that it did not help

## 2021-06-10 NOTE — ED Notes (Signed)
Provider Joseph Art notified via secure chat pt requesting something for her back pain as anytime she moves slightly or tries to reposition in bed her back pain increases significantly. Pt reports standing is better than sitting.

## 2021-06-10 NOTE — ED Notes (Signed)
Urine sample sent to lab. Provider Sherral Hammers verbal d/c of bladder scan. Pt assisted back into bed.

## 2021-06-10 NOTE — ED Notes (Signed)
[name] staff to bedside. Will complete bladder scan once pt able to tolerate; pt going to try to allow [name] to be completed now but states is unsure if can tolerate; pt barely moves and grimaces and calls out due to back pain. Visitor at bedside.

## 2021-06-10 NOTE — ED Provider Notes (Signed)
Tracy Medina    CSN: [id number] Arrival date & time: 06/10/21  0854      History   Chief Complaint Chief Complaint  Patient presents with   Back Pain    Have endometriosis tried to call OBGYN and they were not open in constant pain can't bend over or anything. - Entered by patient    HPI Tracy Medina is a 31 y.o. female.  Accompanied by her mother, patient presents with midline low back pain x 2 days which she attributes to her endometriosis.  No falls or injury.  She reports similar episodes of back pain related to endometriosis in the past.  She denies numbness, weakness, paresthesias, saddle anesthesia, loss of bowel/bladder control, fever, abdominal pain, dysuria, vaginal discharge, pelvic pain, or other symptoms.  Attempted treatment at home with Vicodin.  Her medical history includes endometriosis, asthma, arthritis, morbid obesity, anxiety, depression.  LMP: spotting 2 days ago.  She is followed by [facility] OB[facility]N for endometriosis.   The history is provided by the patient and medical records.   Past Medical History:  Diagnosis Date   Anxiety    Arthritis    Asthma    Depression    Endometriosis     Patient Active Problem List   Diagnosis Date Noted   Morbid obesity (HCC) 05/30/2021   Insomnia 05/24/2020   Elevated blood pressure reading in office without diagnosis of hypertension 05/24/2020   Endometriosis 02/29/2020   Iron deficiency 02/29/2020   Anxiety and depression 02/29/2020   Asthma 02/29/2020   OA (osteoarthritis of the spine) 02/29/2020    Past Surgical History:  Procedure Laterality Date   CHOLECYSTECTOMY     LAPAROSCOPY     TONSILLECTOMY AND ADENOIDECTOMY     TYMPANOSTOMY TUBE PLACEMENT      OB History     Gravida  0   Para  0   Term  0   Preterm  0   AB  0   Living  0      SAB  0   IAB  0   Ectopic  0   Multiple  0   Live Births  0            Home Medications    Prior to Admission medications    Medication Sig Start Date End Date Taking? Authorizing Provider  cyclobenzaprine (FLEXERIL) 10 MG tablet Take 1 tablet (10 mg total) by mouth 2 (two) times daily as needed for muscle spasms. 06/10/21  Yes Mickie Bail, NP  albuterol (VENTOLIN HFA) 108 (90 Base) MCG/ACT inhaler TAKE 2 PUFFS BY MOUTH EVERY 6 HOURS AS NEEDED FOR WHEEZE OR SHORTNESS OF BREATH 11/25/20   Lorre Munroe, NP  benzonatate (TESSALON) 100 MG capsule Take 2 capsules (200 mg total) by mouth 3 (three) times daily as needed for cough. 04/13/21   Bing Neighbors, FNP  Butalbital-APAP-Caffeine [phone] MG capsule Take 1-2 capsules by mouth every 6 (six) hours as needed for headache. Patient not taking: Reported on 06/10/2021 12/22/20   Copland, Ilona Sorrel, PA-C  Elagolix Sodium (ORILISSA) 150 MG TABS Take 150 mg by mouth daily. Patient not taking: Reported on 06/10/2021 12/22/20   Copland, Kenyotta Friske, PA-C  etonogestrel (NEXPLANON) 68 MG IMPL implant 1 each by Subdermal route once. 04/08/20 04/13/21  [provider]  FLOVENT HFA 44 MCG/ACT inhaler INHALE 2 PUFFS INTO THE LUNGS TWICE A DAY 11/30/20   Lorre Munroe, NP  FLUoxetine (PROZAC) 40 MG capsule Take 1  capsule (40 mg total) by mouth daily. 12/22/20   Jearld Fenton, NP  hydrOXYzine (ATARAX/VISTARIL) 25 MG tablet TAKE 1 TABLET BY MOUTH EVERY DAY AS NEEDED 08/12/20   Jearld Fenton, NP  levalbuterol (XOPENEX) 0.31 MG/3ML nebulizer solution TAKE 3 MLS (0.31 MG TOTAL) BY NEBULIZATION EVERY 4 (FOUR) HOURS AS NEEDED FOR WHEEZING. 05/08/21   Jearld Fenton, NP  promethazine-dextromethorphan (PROMETHAZINE-DM) 6.25-15 MG/5ML syrup Take 5 mLs by mouth 4 (four) times daily as needed for cough. 04/13/21   Scot Jun, FNP  traZODone (DESYREL) 50 MG tablet Take 0.5-1 tablets (25-50 mg total) by mouth at bedtime as needed for sleep. 06/22/20   Jearld Fenton, NP  triazolam (HALCION) 0.25 MG tablet SMARTSIG:2 Tablet(s) By Mouth Patient not taking: Reported on 06/10/2021 03/23/21    [provider]    Family History Family History  Adopted: Yes  Problem Relation Age of Onset   Hypertension Mother     Social History Social History   Tobacco Use   Smoking status: Never   Smokeless tobacco: Never  Vaping Use   Vaping Use: Never used  Substance Use Topics   Alcohol use: Yes    Comment: social   Drug use: Never     Allergies   Cefaclor   Review of Systems Review of Systems  Constitutional:  Negative for chills and fever.  Cardiovascular:  Negative for chest pain.  Gastrointestinal:  Negative for abdominal pain, constipation, diarrhea, nausea and vomiting.  Genitourinary:  Negative for dysuria, hematuria, pelvic pain and vaginal discharge.  Musculoskeletal:  Positive for back pain. Negative for gait problem.  Skin:  Negative for color change, rash and wound.  Neurological:  Negative for weakness and numbness.  All other systems reviewed and are negative.   Physical Exam Triage Vital Signs ED Triage Vitals  Enc Vitals Group     BP      Pulse      Resp      Temp      Temp src      SpO2      Weight      Height      Head Circumference      Peak Flow      Pain Score      Pain Loc      Pain Edu?      Excl. in [place]?    No data found.  Updated Vital Signs BP 128/88   Pulse (!) 103   Temp 98.1 F (36.7 C)   Resp 18   SpO2 99%   Visual Acuity Right Eye Distance:   Left Eye Distance:   Bilateral Distance:    Right Eye Near:   Left Eye Near:    Bilateral Near:     Physical Exam Vitals and nursing note reviewed.  Constitutional:      General: She is not in acute distress.    Appearance: She is well-developed. She is obese.     Comments: Tearful throughout exam.  HENT:     Mouth/Throat:     Mouth: Mucous membranes are moist.  Cardiovascular:     Rate and Rhythm: Normal rate and regular rhythm.     Heart sounds: Normal heart sounds.  Pulmonary:     Effort: Pulmonary effort is normal. No respiratory distress.      Breath sounds: Normal breath sounds.  Abdominal:     General: Bowel sounds are normal.     Palpations: Abdomen is soft.  Tenderness: There is no abdominal tenderness. There is no right CVA tenderness, left CVA tenderness, guarding or rebound.  Musculoskeletal:        General: No swelling, tenderness, deformity or signs of injury. Normal range of motion.     Cervical back: Neck supple.  Skin:    General: Skin is warm and dry.     Capillary Refill: Capillary refill takes less than 2 seconds.     Findings: No bruising, erythema, lesion or rash.  Neurological:     General: No focal deficit present.     Mental Status: She is alert and oriented to person, place, and time.     Sensory: No sensory deficit.     Motor: No weakness.     Gait: Gait normal.     UC Treatments / Results  Labs (all labs ordered are listed, but only abnormal results are displayed) Labs Reviewed - No data to display   EKG   Radiology No results found.  Procedures Procedures (including critical care time)  Medications Ordered in UC Medications - No data to display  Initial Impression / Assessment and Plan / UC Course  I have reviewed the triage vital signs and the nursing notes.  Pertinent labs & imaging results that were available during my care of the patient were reviewed by me and considered in my medical decision making (see chart for details).   Acute low back pain, Endometriosis.  Patient unable to provide urine specimen while here.  Treating back pain with Flexeril; precautions for drowsiness with this medication discussed.  Instructed patient to take ibuprofen also.  Education provided on acute back pain and endometriosis.  ED precautions discussed.  Instructed patient to follow-up with her PCP or gynecologist on Monday.  She agrees to plan of care.   Final Clinical Impressions(s) / UC Diagnoses   Final diagnoses:  Acute midline low back pain without sciatica  Endometriosis      Discharge Instructions      Go to the emergency department if you have uncontrolled pain.    Take ibuprofen as needed for discomfort.  Take the muscle relaxer as needed for muscle spasm; Do not drive, operate machinery, or drink alcohol with this medication as it can cause drowsiness.   Follow up with your primary care provider or gynecologist on Monday.        ED Prescriptions     Medication Sig Dispense Auth. Provider   cyclobenzaprine (FLEXERIL) 10 MG tablet Take 1 tablet (10 mg total) by mouth 2 (two) times daily as needed for muscle spasms. 20 tablet Sharion Balloon, NP      I have reviewed the PDMP during this encounter.   Sharion Balloon, NP 06/10/21 [phone]

## 2021-06-14 ENCOUNTER — Ambulatory Visit (INDEPENDENT_AMBULATORY_CARE_PROVIDER_SITE_OTHER): Payer: 59 | Admitting: Obstetrics and Gynecology

## 2021-06-14 ENCOUNTER — Encounter: Payer: Self-pay | Admitting: Obstetrics and Gynecology

## 2021-06-14 VITALS — BP 124/82 | HR 96 | Ht 67.0 in | Wt 260.8 lb

## 2021-06-14 DIAGNOSIS — N809 Endometriosis, unspecified: Secondary | ICD-10-CM

## 2021-06-14 NOTE — Progress Notes (Signed)
31 yo P0 presenting today as an ED follow up on endometriosis and lower back pain. Patient was seen in the ED due to worsening lower back pain which she has experienced previously in relationship to her endometriosis. Patient has laparoscopy proven diagnosis. Patient currently has Nexplanon in place for a little over a year and reports persistent menorrhagia. She has tried Orilissa 150 mg in the past without improvement in her symptoms. Patient presents today requesting a hysterectomy with oophorectomy  Past Medical History:  Diagnosis Date   Anxiety    Arthritis    Asthma    Depression    Endometriosis    Past Surgical History:  Procedure Laterality Date   CHOLECYSTECTOMY     LAPAROSCOPY     TONSILLECTOMY AND ADENOIDECTOMY     TYMPANOSTOMY TUBE PLACEMENT     Family History  Adopted: Yes  Problem Relation Age of Onset   Hypertension Mother    Social History   Tobacco Use   Smoking status: Never   Smokeless tobacco: Never  Vaping Use   Vaping Use: Never used  Substance Use Topics   Alcohol use: Yes    Comment: social   Drug use: Never   ROS See pertinent in HPI. All other systems reviewed and non contributory Blood pressure 124/82, pulse 96, height 5\' 7"  (1.702 m), weight 260 lb 12.8 oz (118.3 kg), SpO2 97 %. GENERAL: Well-developed, well-nourished female in no acute distress.  ABDOMEN: Soft, nontender, nondistended. No organomegaly. PELVIC: Declined EXTREMITIES: No cyanosis, clubbing, or edema, 2+ distal pulses.  Pelvis Complete  Result Date: 06/10/2021 CLINICAL DATA:  Chronic pelvic pain with endometriosis. EXAM: TRANSABDOMINAL ULTRASOUND OF PELVIS DOPPLER ULTRASOUND OF OVARIES TECHNIQUE: Transabdominal ultrasound examination of the pelvis was performed including evaluation of the uterus, ovaries, adnexal regions, and pelvic cul-de-sac. Color Doppler ultrasound was utilized to evaluate blood flow to the ovaries. COMPARISON:  Pelvic ultrasound 02/01/2020. FINDINGS:  Uterus Measurements: Anteverted measuring 6.4 x 4.1 x 5.1 cm = volume: 69.7 mL, previously estimated 63 mL. No fibroids or other mass visualized. Endometrium Thickness: 4.1.  No focal abnormality visualized. Right ovary Measurements: 4.4 x 2.6 x 2.7 cm = volume: 15.8 mL. There is a 3.2 cm anechoic simple cyst but no suspicious lesion. Left  ovary Not seen today. Was visible on the prior study and unremarkable at that time. Color doppler evaluation demonstrates normal appearance of color flow in the right ovary. Other: No free fluid is seen. IMPRESSION: 1. No focal abnormality of the uterine wall or endometrial complex. 2. Nonvisualization of the left ovary. 3. 3.2 cm simple cyst right ovary. No follow-up imaging is recommended. Reference: Radiology 2019 Nov; 293(2):359-371. There is preservation of color flow to this ovary. Electronically Signed   By: 01-02-1975 M.D.   On: 06/10/2021 20:33     A/P 31 yo with lower back pain, endometriosis and menorrhagia - Reviewed ultrasound results with the patient - Informed patient that menorrhagia may be related to Nexplanon. Patient is not currently interested in its removal or trying a different hormonal method - Discussed increasing Orilissa to 200 BID- Patient is not interested in trying - Discussed that hysterectomy may not help improve all of her pain and an oophorectomy is not recommended in a 31 year old. Patient wishes to explore that avenue and thus will be referred to see Dr. 26 and Dr. Logan Bores for surgical consultation - Patient due for pap smear 08/2021

## 2021-06-15 ENCOUNTER — Telehealth: Payer: Self-pay

## 2021-06-15 NOTE — Telephone Encounter (Signed)
I contacted patient via phone x2. I left voicemail for patient to call back to scheduled  for tomorrow at 8:35 am with DJE. Pending patient's call back before putting her on the scheduled

## 2021-06-15 NOTE — Telephone Encounter (Signed)
-----   Message from [place] sent at 06/14/2021  5:21 PM EDT ----- Regarding: Appointment this Friday Please contact this patient and schedule her with Dr. Amalia Hailey this Friday, 5/26. Thanks. ----- Message ----- From: Harlin Heys, MD Sent: 06/14/2021   5:07 PM EDT To: Creig Hines: It seems as if this patient needs to be on my [facility] schedule in the very near future.  Thank you   ----- Message ----- From: Mora Bellman, MD Sent: 06/14/2021   3:33 PM EDT To: Rubie Maid, MD, Harlin Heys, MD, #  Please coordinate surgical consultation for laparoscopic hysterectomy for this patient with Dr. Amalia Hailey or Dr. Marcelline Mates  Thanks  Peggy

## 2021-06-15 NOTE — Telephone Encounter (Signed)
Called and left voicemail for patient to call back to be scheduled. 

## 2021-06-23 ENCOUNTER — Telehealth: Payer: Self-pay | Admitting: Obstetrics and Gynecology

## 2021-06-23 NOTE — Telephone Encounter (Signed)
Called Tracy Medina to schedule appt with Dr. Amalia Hailey for surgical consultation for laparoscopic hysterectomy.  Left message for Tracy Medina to call back.

## 2021-06-24 ENCOUNTER — Ambulatory Visit (HOSPITAL_COMMUNITY): Payer: 59

## 2021-06-24 ENCOUNTER — Encounter: Payer: Self-pay | Admitting: Emergency Medicine

## 2021-06-24 ENCOUNTER — Emergency Department: Payer: 59

## 2021-06-24 ENCOUNTER — Other Ambulatory Visit: Payer: Self-pay

## 2021-06-24 ENCOUNTER — Emergency Department
Admission: EM | Admit: 2021-06-24 | Discharge: 2021-06-24 | Disposition: A | Discharge status: Home / Self Care | Payer: 59 | Attending: Emergency Medicine | Admitting: Emergency Medicine

## 2021-06-24 DIAGNOSIS — M5136 Other intervertebral disc degeneration, lumbar region: Secondary | ICD-10-CM | POA: Diagnosis not present

## 2021-06-24 DIAGNOSIS — M4300 Spondylolysis, site unspecified: Secondary | ICD-10-CM

## 2021-06-24 DIAGNOSIS — M545 Low back pain, unspecified: Secondary | ICD-10-CM | POA: Diagnosis present

## 2021-06-24 DIAGNOSIS — R109 Unspecified abdominal pain: Secondary | ICD-10-CM | POA: Insufficient documentation

## 2021-06-24 LAB — URINALYSIS, ROUTINE W REFLEX MICROSCOPIC
Bacteria, UA: NONE SEEN
Bilirubin Urine: NEGATIVE
Glucose, UA: NEGATIVE mg/dL
Ketones, ur: NEGATIVE mg/dL
Leukocytes,Ua: NEGATIVE
Nitrite: NEGATIVE
Protein, ur: NEGATIVE mg/dL
Specific Gravity, Urine: 1.012 (ref 1.005–1.030)
pH: 6 (ref 5.0–8.0)

## 2021-06-24 LAB — PREGNANCY, URINE: Preg Test, Ur: NEGATIVE

## 2021-06-24 MED ORDER — LIDOCAINE 5 % EX PTCH
1.0000 | MEDICATED_PATCH | Freq: Once | CUTANEOUS | Status: DC
  Administered 2021-06-24: 1 via TRANSDERMAL
  Filled 2021-06-24: qty 1

## 2021-06-24 MED ORDER — OXYCODONE HCL 5 MG PO TABS: 5.0000 mg | ORAL_TABLET | Freq: Three times a day (TID) | ORAL | 0 refills | Status: DC | PRN

## 2021-06-24 MED ORDER — LIDOCAINE 5 % EX PTCH: 1.0000 | MEDICATED_PATCH | Freq: Two times a day (BID) | CUTANEOUS | 0 refills | Status: DC | PRN

## 2021-06-24 MED ORDER — ORPHENADRINE CITRATE 30 MG/ML IJ SOLN
60.0000 mg | INTRAMUSCULAR | Status: AC
  Administered 2021-06-24: 60 mg via INTRAMUSCULAR
  Filled 2021-06-24: qty 2

## 2021-06-24 MED ORDER — DICLOFENAC SODIUM 75 MG PO TBEC: 75.0000 mg | DELAYED_RELEASE_TABLET | Freq: Two times a day (BID) | ORAL | 0 refills | Status: DC

## 2021-06-24 MED ORDER — KETOROLAC TROMETHAMINE 30 MG/ML IJ SOLN
30.0000 mg | Freq: Once | INTRAMUSCULAR | Status: AC
  Administered 2021-06-24: 30 mg via INTRAMUSCULAR
  Filled 2021-06-24: qty 1

## 2021-06-24 MED ORDER — TIZANIDINE HCL 4 MG PO TABS: 4.0000 mg | ORAL_TABLET | Freq: Three times a day (TID) | ORAL | 0 refills | Status: AC

## 2021-06-24 MED ORDER — OXYCODONE-ACETAMINOPHEN 5-325 MG PO TABS
1.0000 | ORAL_TABLET | Freq: Once | ORAL | Status: AC
  Administered 2021-06-24: 1 via ORAL
  Filled 2021-06-24: qty 1

## 2021-06-24 NOTE — ED Triage Notes (Signed)
Pt reports back pain for 3 weeks and the past 3 days the pain has been radiating down her left leg. Pt states the pain is lower back, buttocks and then down her leg a little ways. Denies obvious injuries.

## 2021-06-24 NOTE — Discharge Instructions (Addendum)
Take the prescription meds as directed. Follow-up with your provider for further management.

## 2021-06-24 NOTE — ED Provider Notes (Signed)
[facility] Emergency Department Provider Note     Event Date/Time   First MD Initiated Contact with Patient 06/24/21 1707     (approximate)   History   Back Pain   HPI  Tracy Medina is a 31 y.o. female with a very mild history of osteoarthritis of the spine, morbid obesity, and endometriosis, presents to the ED for 3 weeks of progressively worsening low back pain.  Patient reports pain that radiates down the left and right buttocks intermittently.  Denies any recent injury, trauma, fall.  She also denies any bladder or bowel incontinence, foot drop, or saddle anesthesias.  Patient was treated recently for a UTI which presented as back pain without urinary symptoms.  She presents to the ED noting increased pain and disability with transitioning from supine to sit and sit to stand.  She recently started on a muscle relaxant but denies any significant benefit.   Physical Exam   Triage Vital Signs: ED Triage Vitals  Enc Vitals Group     BP 06/24/21 1654 (!) 151/87     Pulse Rate 06/24/21 1654 77     Resp 06/24/21 1654 20     Temp 06/24/21 1654 98.9 F (37.2 C)     Temp Source 06/24/21 1654 Oral     SpO2 06/24/21 1654 98 %     Weight 06/24/21 1640 264 lb 8.8 oz (120 kg)     Height 06/24/21 1640 5\' 7"  (1.702 m)     Head Circumference --      Peak Flow --      Pain Score 06/24/21 1640 6     Pain Loc --      Pain Edu? --      Excl. in GC? --     Most recent vital signs: Vitals:   06/24/21 1654 06/24/21 2014  BP: (!) 151/87 (!) 145/89  Pulse: 77 80  Resp: 20 20  Temp: 98.9 F (37.2 C) 98.6 F (37 C)  SpO2: 98% 100%    General Awake, no distress.  CV:  Good peripheral perfusion.  RESP:  Normal effort.  ABD:  No distention.  MSK:  Mild tender palpation to the lumbar sacral junction.  No spasm, deformity, or step-off appreciated.  Exam is initially limited by patient's subjective complaints of pain and spontaneous spasms of involuntary  guarding.   ED Results / Procedures / Treatments   Labs (all labs ordered are listed, but only abnormal results are displayed) Labs Reviewed  URINALYSIS, ROUTINE W REFLEX MICROSCOPIC - Abnormal; Notable for the following components:      Result Value   Color, Urine YELLOW (*)    APPearance CLEAR (*)    Hgb urine dipstick MODERATE (*)    All other components within normal limits  PREGNANCY, URINE     EKG    RADIOLOGY  I personally viewed and evaluated these images as part of my medical decision making, as well as reviewing the written report by the radiologist.  ED Provider Interpretation: no acute findings. Spurs and mild DDD noted}  CT L-SPINE NO CHARGE  Result Date: 06/24/2021 CLINICAL DATA:  Initial evaluation for acute flank pain. EXAM: CT LUMBAR SPINE WITHOUT CONTRAST TECHNIQUE: Multidetector CT imaging of the lumbar spine was performed without intravenous contrast administration. Multiplanar CT image reconstructions were also generated. RADIATION DOSE REDUCTION: This exam was performed according to the departmental dose-optimization program which includes automated exposure control, adjustment of the mA and/or kV according to patient  size and/or use of iterative reconstruction technique. COMPARISON:  None Available. FINDINGS: Segmentation: Standard. Lowest well-formed disc space labeled the L5-S1 level. Alignment: Mild sigmoid scoliosis.  No listhesis. Vertebrae: Unilateral chronic right-sided pars defect present at L5 without spondylolisthesis. Vertebral body height maintained without acute or chronic fracture. Chronic limbus deformity with endplate spurring noted at the superior endplate of L5. Visualized sacrum and pelvis intact. No discrete or worrisome osseous lesions. Paraspinal and other soft tissues: Paraspinous soft tissues demonstrate no acute finding. Disc levels: Mild lower lumbar degenerative disc bulging at L3-4 through L5-S1 without significant spinal stenosis.  IMPRESSION: 1. No acute abnormality within the lumbar spine. 2. 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 Electronically Signed   By: Rise Mu M.D.   On: 06/24/2021 19:05   CT Renal Stone Study  Result Date: 06/24/2021 CLINICAL DATA:  Bilateral flank pain. EXAM: CT ABDOMEN AND PELVIS WITHOUT CONTRAST TECHNIQUE: Multidetector CT imaging of the abdomen and pelvis was performed following the standard protocol without IV contrast. RADIATION DOSE REDUCTION: This exam was performed according to the departmental dose-optimization program which includes automated exposure control, adjustment of the mA and/or kV according to patient size and/or use of iterative reconstruction technique. COMPARISON:  None Available. FINDINGS: Lower chest: No acute abnormality. Hepatobiliary: No focal liver abnormality is seen. Status post cholecystectomy. No biliary dilatation. Pancreas: Unremarkable. No pancreatic ductal dilatation or surrounding inflammatory changes. Spleen: Normal in size without focal abnormality. Adrenals/Urinary Tract: Adrenal glands are unremarkable. Kidneys are normal, without renal calculi, focal lesion, or hydronephrosis. Bladder is unremarkable. Stomach/Bowel: Stomach is within normal limits. Appendix appears normal. No evidence of bowel wall thickening, distention, or inflammatory changes. Vascular/Lymphatic: No significant vascular findings are present. No enlarged abdominal or pelvic lymph nodes. Reproductive: Uterus and bilateral adnexa are unremarkable. Other: No abdominal wall hernia or abnormality. No abdominopelvic ascites. Musculoskeletal: No acute or significant osseous findings. IMPRESSION: 1. No acute or active process within the abdomen or pelvis. 2. Status post cholecystectomy. Electronically Signed   By: Aram Candela M.D.   On: 06/24/2021 19:05     PROCEDURES:  Critical Care performed: No  Procedures   MEDICATIONS ORDERED IN  ED: Medications  ketorolac (TORADOL) 30 MG/ML injection 30 mg (30 mg Intramuscular Given 06/24/21 1734)  orphenadrine (NORFLEX) injection 60 mg (60 mg Intramuscular Given 06/24/21 1732)  oxyCODONE-acetaminophen (PERCOCET/ROXICET) 5-325 MG per tablet 1 tablet (1 tablet Oral Given 06/24/21 2001)     IMPRESSION / MDM / ASSESSMENT AND PLAN / ED COURSE  I reviewed the triage vital signs and the nursing notes.                              Differential diagnosis includes, but is not limited to, lumbar strain, UTI, pyelonephritis, kidney stones, sacroiliitis, radiculopathy  Patient's presentation is most consistent with acute complicated illness / injury requiring diagnostic workup.  Patient to the ED for evaluation of persistent low back pain for the last 3 weeks.  Patient was evaluated for her complaints in ED, found have a reassuring work-up overall.  No red flags on exam.  Patient with significant pain and disability upon initial evaluation.  CT imaging for renal stone/pyelo as well as lumbar spine, does not reveal any acute intra-abdominal process or renal findings.  Patient does have some multilevel DDD, and a pars defect appreciated.  No spondylolisthesis is noted.  I reviewed the images in detail and explained the findings to  the patient and family.  Patient's diagnosis is consistent with degenerative disc disease and acute bilateral low back pain.  Patient with significant relief of her symptoms following ED medication administration.  Patient is stable for discharge and outpatient management as she has no indication of any acute spinal cord compression or malignant neuropathy.  Patient will be discharged home with prescriptions for tizanidine, Lidoderm patches, diclofenac, and Roxicodone (9). Patient is to follow up with her primary provider or orthospine as needed or otherwise directed. Patient is given ED precautions to return to the ED for any worsening or new symptoms.  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    Lissa Hoard, PA-C 06/25/21 1647    Georga Hacking, MD 06/25/21 [phone]

## 2021-06-24 NOTE — ED Provider Notes (Incomplete)
[facility] Emergency Department Provider Note     Event Date/Time   First MD Initiated Contact with Patient 06/24/21 1707     (approximate)   History   Back Pain   HPI  Tracy Medina is a 31 y.o. female with a history of endometriosis, asthma, anxiety, and obesity, presents to the ED for persistent back pain for the last 3 weeks.  Patient was evaluated here in the ED 2 weeks prior, treated for a acute UTI at that she presented with low back pain.  Patient was unaware of her bacteriuria at that time.  She completed a course of antibiotics and reports improved symptoms overall.  She notes that she has had intermittent spasms of the low back that have persisted and worsened.  At the last few days she has had significant pain and low back causing disability.  She denies any recent injury, trauma, or falls.  She also denies any bladder or bowel incontinence, foot drop, or saddle anesthesia.  Patient denies any history of chronic ongoing low back pain.     Physical Exam   Triage Vital Signs: ED Triage Vitals  Enc Vitals Group     BP 06/24/21 1654 (!) 151/87     Pulse Rate 06/24/21 1654 77     Resp 06/24/21 1654 20     Temp 06/24/21 1654 98.9 F (37.2 C)     Temp Source 06/24/21 1654 Oral     SpO2 06/24/21 1654 98 %     Weight 06/24/21 1640 264 lb 8.8 oz (120 kg)     Height 06/24/21 1640 5\' 7"  (1.702 m)     Head Circumference --      Peak Flow --      Pain Score 06/24/21 1640 6     Pain Loc --      Pain Edu? --      Excl. in GC? --     Most recent vital signs: Vitals:   06/24/21 1654  BP: (!) 151/87  Pulse: 77  Resp: 20  Temp: 98.9 F (37.2 C)  SpO2: 98%    General Awake, no distress. *** {**HEENT NCAT. PERRL. EOMI. No rhinorrhea. Mucous membranes are moist. **} CV:  Good peripheral perfusion. *** RESP:  Normal effort. *** ABD:  No distention. *** {**Other: **}   ED Results / Procedures / Treatments   Labs (all labs ordered are  listed, but only abnormal results are displayed) Labs Reviewed  URINALYSIS, ROUTINE W REFLEX MICROSCOPIC  POC URINE PREG, ED     EKG  ***  RADIOLOGY  {**I personally viewed and evaluated these images as part of my medical decision making, as well as reviewing the written report by the radiologist.  ED Provider Interpretation: ***}  No results found.   PROCEDURES:  Critical Care performed: {CriticalCareYesNo:19197::"Yes, see critical care procedure note(s)","No"}  Procedures   MEDICATIONS ORDERED IN ED: Medications  ketorolac (TORADOL) 30 MG/ML injection 30 mg (30 mg Intramuscular Given 06/24/21 1734)  orphenadrine (NORFLEX) injection 60 mg (60 mg Intramuscular Given 06/24/21 1732)     IMPRESSION / MDM / ASSESSMENT AND PLAN / ED COURSE  I reviewed the triage vital signs and the nursing notes.                              Differential diagnosis includes, but is not limited to, ***  Patient's presentation is most consistent with {EM COPA:27473}  {**  The patient is on the cardiac monitor to evaluate for evidence of arrhythmia and/or significant heart rate changes.**}  Patient's diagnosis is consistent with ***. Patient will be discharged home with prescriptions for ***. Patient is to follow up with *** as needed or otherwise directed. Patient is given ED precautions to return to the ED for any worsening or new symptoms.     FINAL CLINICAL IMPRESSION(S) / ED DIAGNOSES   Final diagnoses:  None     Rx / DC Orders   ED Discharge Orders     None        Note:  This document was prepared using Dragon voice recognition software and may include unintentional dictation errors.

## 2021-06-26 ENCOUNTER — Encounter: Payer: Self-pay | Admitting: Internal Medicine

## 2021-06-27 ENCOUNTER — Telehealth (INDEPENDENT_AMBULATORY_CARE_PROVIDER_SITE_OTHER): Payer: 59 | Admitting: Internal Medicine

## 2021-06-27 ENCOUNTER — Encounter: Payer: Self-pay | Admitting: Internal Medicine

## 2021-06-27 DIAGNOSIS — M5442 Lumbago with sciatica, left side: Secondary | ICD-10-CM | POA: Diagnosis not present

## 2021-06-27 DIAGNOSIS — N3000 Acute cystitis without hematuria: Secondary | ICD-10-CM

## 2021-06-27 MED ORDER — TRAMADOL HCL 50 MG PO TABS: 50.0000 mg | ORAL_TABLET | Freq: Three times a day (TID) | ORAL | 0 refills | Status: AC | PRN

## 2021-06-27 MED ORDER — LIDOCAINE 5 % EX PTCH: 1.0000 | MEDICATED_PATCH | Freq: Two times a day (BID) | CUTANEOUS | 0 refills | Status: AC | PRN

## 2021-06-27 MED ORDER — IBUPROFEN 800 MG PO TABS: 800.0000 mg | ORAL_TABLET | Freq: Three times a day (TID) | ORAL | 0 refills | Status: DC | PRN

## 2021-06-27 NOTE — Progress Notes (Signed)
Virtual Visit via Video Note  I connected with Tracy JenkinsAlicia Glidden on 06/27/21 at  3:40 PM EDT by a video enabled telemedicine application and verified that I am speaking with the correct person using two identifiers.  Location: Patient: Home Provider: Office  Persons participating in this video call: Nicki Reaperegina Leonard Feigel, NP and Tracy JenkinsAlicia Mosco  I discussed the limitations of evaluation and management by telemedicine and the availability of in person appointments. The patient expressed understanding and agreed to proceed.  History of Present Illness:  Patient due for multiple ER follow-up.  [facility] with complaint of [facility] pain.  They were not able to obtain a urine specimen.  She was diagnosed with back pain likely due to endometriosis.  She was given Cyclobenzaprine and advised to follow-up in the ER.  She presented to the ER same day for the same.  Urinalysis was concerning for infection.  No urine culture was obtained.  Pelvic ultrasound showed a simple 2.3 cm cyst.  She was discharged with a prescription for Septra DS.  She was seen in the ER again 6/3 for the same.  CT renal stone study was negative.  CT lumbar spine showed:  IMPRESSION: 1. No acute abnormality within the lumbar spine. 2. 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  She was discharged with Tizanidine, Lidoderm, Diclofenac and Tramadol.  Since discharge, she reports persistent left side low back pain. She describes intermittent sharp and stabbing, occasionally spasm or a constant ache. The pain radiates down her left leg at times. She denies numbness, tingling or weakness of the left lower extremity. She denies loss of bowel or bladder control.    Past Medical History:  Diagnosis Date   Anxiety    Arthritis    Asthma    Depression    Endometriosis     Current Outpatient Medications  Medication Sig Dispense Refill   albuterol (VENTOLIN HFA) 108 (90 Base)  MCG/ACT inhaler TAKE 2 PUFFS BY MOUTH EVERY 6 HOURS AS NEEDED FOR WHEEZE OR SHORTNESS OF BREATH 8.5 each 0   cyclobenzaprine (FLEXERIL) 10 MG tablet Take 1 tablet (10 mg total) by mouth 2 (two) times daily as needed for muscle spasms. 20 tablet 0   diclofenac (VOLTAREN) 75 MG EC tablet Take 1 tablet (75 mg total) by mouth 2 (two) times daily. 60 tablet 0   Elagolix Sodium (ORILISSA) 150 MG TABS Take 150 mg by mouth daily. 30 tablet 2   etonogestrel (NEXPLANON) 68 MG IMPL implant 1 each by Subdermal route once.     FLOVENT HFA 44 MCG/ACT inhaler INHALE 2 PUFFS INTO THE LUNGS TWICE A DAY 10.6 each 1   FLUoxetine (PROZAC) 40 MG capsule Take 1 capsule (40 mg total) by mouth daily. 90 capsule 2   hydrOXYzine (ATARAX/VISTARIL) 25 MG tablet TAKE 1 TABLET BY MOUTH EVERY DAY AS NEEDED 90 tablet 0   levalbuterol (XOPENEX) 0.31 MG/3ML nebulizer solution TAKE 3 MLS (0.31 MG TOTAL) BY NEBULIZATION EVERY 4 (FOUR) HOURS AS NEEDED FOR WHEEZING. 90 mL 1   lidocaine (LIDODERM) 5 % Place 1 patch onto the skin every 12 (twelve) hours as needed for up to 10 days. Remove & Discard patch after 12 hours of wear each day. 10 patch 0   oxyCODONE (ROXICODONE) 5 MG immediate release tablet Take 1 tablet (5 mg total) by mouth every 8 (eight) hours as needed for up to 3 days. 9 tablet 0   tiZANidine (ZANAFLEX) 4 MG tablet Take  1 tablet (4 mg total) by mouth 3 (three) times daily for 7 days. 21 tablet 0   traZODone (DESYREL) 50 MG tablet Take 0.5-1 tablets (25-50 mg total) by mouth at bedtime as needed for sleep. 30 tablet 0   triazolam (HALCION) 0.25 MG tablet      No current facility-administered medications for this visit.    Allergies  Allergen Reactions   Cefaclor Hives and Rash    Family History  Adopted: Yes  Problem Relation Age of Onset   Hypertension Mother     Social History   Socioeconomic History   Marital status: Single    Spouse name: Not on file   Number of children: Not on file   Years of  education: Not on file   Highest education level: Not on file  Occupational History   Not on file  Tobacco Use   Smoking status: Never   Smokeless tobacco: Never  Vaping Use   Vaping Use: Never used  Substance and Sexual Activity   Alcohol use: Yes    Comment: social   Drug use: Never   Sexual activity: Not Currently    Birth control/protection: Implant  Other Topics Concern   Not on file  Social History Narrative   Not on file   Social Determinants of Health   Financial Resource Strain: Not on file  Food Insecurity: Not on file  Transportation Needs: Not on file  Physical Activity: Not on file  Stress: Not on file  Social Connections: Not on file  Intimate Partner Violence: Not on file     Constitutional: Denies fever, malaise, fatigue, headache or abrupt weight changes.  Respiratory: Denies difficulty breathing, shortness of breath, cough or sputum production.   Cardiovascular: Denies chest pain, chest tightness, palpitations or swelling in the hands or feet.  Gastrointestinal: Denies abdominal pain, bloating, constipation, diarrhea or blood in the stool.  GU: Denies urgency, frequency, pain with urination, burning sensation, blood in urine, odor or discharge. Musculoskeletal: Pt reports left side low back pain. Denies decrease in range of motion, difficulty with gait, or joint swelling.  Skin: Denies redness, rashes, lesions or ulcercations.  Neurological: Denies numbness, tingling, weakness or problems with balance and coordination.    No other specific complaints in a complete review of systems (except as listed in HPI above).  Observations/Objective:  Wt Readings from Last 3 Encounters:  06/24/21 264 lb 8.8 oz (120 kg)  06/14/21 260 lb 12.8 oz (118.3 kg)  06/10/21 230 lb (104.3 kg)    General: Appears her stated age,  in NAD. Skin: Warm, dry and intact.  Pulmonary/Chest: Normal effort. No respiratory distress. Musculoskeletal: Unable to visualize  ROM. Neurological: Alert and oriented.   BMET    Component Value Date/Time   NA 138 06/10/2021 1933   K 4.3 06/10/2021 1933   CL 107 06/10/2021 1933   CO2 25 06/10/2021 1933   GLUCOSE 93 06/10/2021 1933   BUN 12 06/10/2021 1933   CREATININE 0.81 06/10/2021 1933   CREATININE 0.84 05/30/2021 0858   CALCIUM 9.1 06/10/2021 1933   GFRNONAA >60 06/10/2021 1933    Lipid Panel     Component Value Date/Time   CHOL 153 05/30/2021 0858   TRIG 74 05/30/2021 0858   HDL 57 05/30/2021 0858   CHOLHDL 2.7 05/30/2021 0858   LDLCALC 81 05/30/2021 0858    CBC    Component Value Date/Time   WBC 8.5 06/10/2021 1933   RBC 4.40 06/10/2021 1933   HGB 12.6  06/10/2021 1933   HCT 39.7 06/10/2021 1933   PLT 362 06/10/2021 1933   MCV 90.2 06/10/2021 1933   MCH 28.6 06/10/2021 1933   MCHC 31.7 06/10/2021 1933   RDW 13.3 06/10/2021 1933   LYMPHSABS 2.4 06/10/2021 1933   MONOABS 0.7 06/10/2021 1933   EOSABS 0.2 06/10/2021 1933   BASOSABS 0.1 06/10/2021 1933    Hgb A1C Lab Results  Component Value Date   HGBA1C 5.1 05/30/2021     Assessment and Plan:  ER follow-up for UTI, Low Back Pain:  ER notes, labs and imaging reviewed Discussed trying Prednisone and Gabapentin but she would like to hold off at this time Will refill Tramadol and Lidoderm patches She has plenty of Tinazadine Stop Diclofenac, try Ibuprofen 800 mg Q8H prn Encouraged ice and stretching Referral placed to PT for further evaluation  Return precautions discussed  Follow Up Instructions:    I discussed the assessment and treatment plan with the patient. The patient was provided an opportunity to ask questions and all were answered. The patient agreed with the plan and demonstrated an understanding of the instructions.   The patient was advised to call back or seek an in-person evaluation if the symptoms worsen or if the condition fails to improve as anticipated.    Nicki Reaper, NP

## 2021-06-27 NOTE — Patient Instructions (Signed)

## 2021-06-28 ENCOUNTER — Encounter: Payer: Self-pay | Admitting: Internal Medicine

## 2021-06-29 ENCOUNTER — Ambulatory Visit (INDEPENDENT_AMBULATORY_CARE_PROVIDER_SITE_OTHER): Payer: 59 | Admitting: Obstetrics and Gynecology

## 2021-06-29 ENCOUNTER — Encounter: Payer: Self-pay | Admitting: Obstetrics and Gynecology

## 2021-06-29 ENCOUNTER — Ambulatory Visit: Payer: 59 | Admitting: Internal Medicine

## 2021-06-29 VITALS — BP 108/70 | Ht 67.0 in | Wt 261.6 lb

## 2021-06-29 DIAGNOSIS — N809 Endometriosis, unspecified: Secondary | ICD-10-CM | POA: Diagnosis not present

## 2021-06-29 NOTE — Progress Notes (Signed)
Patient presents today for surgical consult regarding a hysterectomy. She states severe cramping and lengthy menstrual periods, currently has Nexplanon implant. Patient states with her endometriosis and irregular cycles she would like to discuss a partial hysterectomy. Patient states no other questions at this time.

## 2021-06-29 NOTE — Progress Notes (Signed)
HPI:      Tracy Medina is a 31 y.o. G0P0000 who LMP was Patient's last menstrual period was 06/22/2021 (approximate).  Subjective:   She presents today to discuss her endometriosis and the possibility of surgery.  She states that she has had endometriosis for many years.  Has had previous laparoscopic surgery for diagnosis.  She has subsequently tried [place] without success.  She has very heavy menstrual bleeding and significant pain throughout the month. She has never had children but states that she does not desire children.  She is very sure of this decision. She is not interested in other hormonal attempts at control of her endometriosis. Of significant note, patient has had pelvic "trauma" and cannot submit to pelvic exams or Pap smears unless she is under anesthesia.    Hx: The following portions of the patient's history were reviewed and updated as appropriate:             She  has a past medical history of Anxiety, Arthritis, Asthma, Depression, and Endometriosis. She does not have any pertinent problems on file. She  has a past surgical history that includes laparoscopy; Cholecystectomy; Tonsillectomy and adenoidectomy; and Tympanostomy tube placement. Her family history includes Hypertension in her mother. She was adopted. She  reports that she has never smoked. She has never used smokeless tobacco. She reports current alcohol use. She reports that she does not use drugs. She has a current medication list which includes the following prescription(s): albuterol, cyclobenzaprine, flovent hfa, fluoxetine, ibuprofen, levalbuterol, lidocaine, tizanidine, tramadol, trazodone, triazolam, and nexplanon. She is allergic to cefaclor.       Review of Systems:  Review of Systems  Constitutional: Denied constitutional symptoms, night sweats, recent illness, fatigue, fever, insomnia and weight loss.  Eyes: Denied eye symptoms, eye pain, photophobia, vision change and visual  disturbance.  Ears/Nose/Throat/Neck: Denied ear, nose, throat or neck symptoms, hearing loss, nasal discharge, sinus congestion and sore throat.  Cardiovascular: Denied cardiovascular symptoms, arrhythmia, chest pain/pressure, edema, exercise intolerance, orthopnea and palpitations.  Respiratory: Denied pulmonary symptoms, asthma, pleuritic pain, productive sputum, cough, dyspnea and wheezing.  Gastrointestinal: Denied, gastro-esophageal reflux, melena, nausea and vomiting.  Genitourinary: See HPI for additional information.  Musculoskeletal: Denied musculoskeletal symptoms, stiffness, swelling, muscle weakness and myalgia.  Dermatologic: Denied dermatology symptoms, rash and scar.  Neurologic: Denied neurology symptoms, dizziness, headache, neck pain and syncope.  Psychiatric: Denied psychiatric symptoms, anxiety and depression.  Endocrine: Denied endocrine symptoms including hot flashes and night sweats.   Meds:   Current Outpatient Medications on File Prior to Visit  Medication Sig Dispense Refill   albuterol (VENTOLIN HFA) 108 (90 Base) MCG/ACT inhaler TAKE 2 PUFFS BY MOUTH EVERY 6 HOURS AS NEEDED FOR WHEEZE OR SHORTNESS OF BREATH 8.5 each 0   cyclobenzaprine (FLEXERIL) 10 MG tablet Take 1 tablet (10 mg total) by mouth 2 (two) times daily as needed for muscle spasms. 20 tablet 0   FLOVENT HFA 44 MCG/ACT inhaler INHALE 2 PUFFS INTO THE LUNGS TWICE A DAY 10.6 each 1   FLUoxetine (PROZAC) 40 MG capsule Take 1 capsule (40 mg total) by mouth daily. 90 capsule 2   ibuprofen (ADVIL) 800 MG tablet Take 1 tablet (800 mg total) by mouth every 8 (eight) hours as needed. 30 tablet 0   levalbuterol (XOPENEX) 0.31 MG/3ML nebulizer solution TAKE 3 MLS (0.31 MG TOTAL) BY NEBULIZATION EVERY 4 (FOUR) HOURS AS NEEDED FOR WHEEZING. 90 mL 1   lidocaine (LIDODERM) 5 % Place 1 patch onto  the skin every 12 (twelve) hours as needed for up to 10 days. Remove & Discard patch after 12 hours of wear each day. 10 patch  0   tiZANidine (ZANAFLEX) 4 MG tablet Take 1 tablet (4 mg total) by mouth 3 (three) times daily for 7 days. 21 tablet 0   traMADol (ULTRAM) 50 MG tablet Take 1 tablet (50 mg total) by mouth every 8 (eight) hours as needed for up to 5 days. 15 tablet 0   traZODone (DESYREL) 50 MG tablet Take 0.5-1 tablets (25-50 mg total) by mouth at bedtime as needed for sleep. 30 tablet 0   triazolam (HALCION) 0.25 MG tablet      etonogestrel (NEXPLANON) 68 MG IMPL implant 1 each by Subdermal route once.     No current facility-administered medications on file prior to visit.      Objective:     Vitals:   06/29/21 1610  BP: 108/70   Filed Weights   06/29/21 1610  Weight: 261 lb 9.6 oz (118.7 kg)              Chart reviewed revealing history of pelvic endometriosis.          Assessment:    G0P0000 Patient Active Problem List   Diagnosis Date Noted   Morbid obesity (HCC) 05/30/2021   Insomnia 05/24/2020   Elevated blood pressure reading in office without diagnosis of hypertension 05/24/2020   Endometriosis 02/29/2020   Iron deficiency 02/29/2020   Anxiety and depression 02/29/2020   Asthma 02/29/2020   OA (osteoarthritis of the spine) 02/29/2020     1. Endometriosis     Patient having constant discomfort and irregular bleeding.  She would like definitive surgery.   Plan:            1.  Surgery for endometriosis discussed in detail.  Other nonsurgical options including IUD were discussed and she has declined these.  The difference between hysterectomy and hysterectomy with bilateral oophorectomy was discussed.  I believe she is leaning toward hysterectomy only. She will inform us when she has reached a decision and she will schedule her preop visit.  A concern is that she will not be able to undergo pelvic exam prior to anesthesia which makes a current Pap smear unlikely. Surgical consideration is for LAVH bilateral salpingectomy ablation of endometriotic implants. Orders No  orders of the defined types were placed in this encounter.   No orders of the defined types were placed in this encounter.     F/U  No follow-ups on file. I spent 35 minutes involved in the care of this patient preparing to see the patient by obtaining and reviewing her medical history (including labs, imaging tests and prior procedures), documenting clinical information in the electronic health record (EHR), counseling and coordinating care plans, writing and sending prescriptions, ordering tests or procedures and in direct communicating with the patient and medical staff discussing pertinent items from her history and physical exam.  Elonda Husky, M.D. 06/29/2021 4:43 PM

## 2021-06-29 NOTE — Telephone Encounter (Signed)
Ok for work note? 

## 2021-07-03 ENCOUNTER — Telehealth: Payer: Self-pay

## 2021-07-03 NOTE — Telephone Encounter (Signed)
I am confused.  Do they need a referral to orthopedics or not?

## 2021-07-03 NOTE — Telephone Encounter (Signed)
Copied from CRM [id number]. Topic: Referral - Request for Referral >> Jun 30, 2021 12:11 PM Shanda Bumps I wrote: Has patient seen PCP for this complaint? Yes. /Was seen in ER  Referral for which specialty: Orthopedic surgery  Preferred provider/office: [facility]/Chris Gaines  Reason for referral: please see ER note from 6/3/ Pt mom would like this placed as urgent as the patient is in a lot of pain. >> Jun 30, 2021 12:33 PM Dondra Prader E wrote: Pt's mother called to report that per the patient's insurance she does not need a referral. Pt's mother is requesting a call back to discuss recommendations, please advise   Best contact: [phone]

## 2021-07-04 NOTE — Telephone Encounter (Signed)
Patient's mother states patient does not need a referral to orthopedics  Mother making provider aware patient was seen at [facility] yesterday 6-12 and a mri was ordered, patient will start PT on 7-5  Also patients anti inflammatory and muscle relaxer medication prescribed by provider was changed due to patient experiencing nausea when taking medications  Mother requesting provider's recommendations to a spine specialist, she states she does not need a referral, only recommendations  Please advise

## 2021-07-04 NOTE — Telephone Encounter (Signed)
LMTCB 07/04/2021.  PEC please advise.   Thanks,   -Vernona Rieger

## 2021-07-05 NOTE — Telephone Encounter (Signed)
Webb Silversmith advised.   Thanks,   -Mickel Baas

## 2021-07-05 NOTE — Telephone Encounter (Signed)
My recommendation would be [facility]

## 2021-07-07 NOTE — Telephone Encounter (Signed)
Patient is scheduled for 07/28/21 with DJE

## 2021-07-28 ENCOUNTER — Encounter: Payer: Self-pay | Admitting: Obstetrics and Gynecology

## 2021-07-28 ENCOUNTER — Ambulatory Visit (INDEPENDENT_AMBULATORY_CARE_PROVIDER_SITE_OTHER): Payer: 59 | Admitting: Obstetrics and Gynecology

## 2021-07-28 VITALS — BP 136/90 | Ht 67.0 in | Wt 263.0 lb

## 2021-07-28 DIAGNOSIS — N809 Endometriosis, unspecified: Secondary | ICD-10-CM | POA: Diagnosis not present

## 2021-07-28 DIAGNOSIS — N921 Excessive and frequent menstruation with irregular cycle: Secondary | ICD-10-CM | POA: Diagnosis not present

## 2021-07-28 DIAGNOSIS — Z01818 Encounter for other preprocedural examination: Secondary | ICD-10-CM | POA: Diagnosis not present

## 2021-07-28 DIAGNOSIS — R102 Pelvic and perineal pain: Secondary | ICD-10-CM | POA: Diagnosis not present

## 2021-07-28 MED ORDER — METRONIDAZOLE 500 MG PO TABS: 500.0000 mg | ORAL_TABLET | Freq: Two times a day (BID) | ORAL | 0 refills | Status: DC

## 2021-07-28 NOTE — Progress Notes (Signed)
Patient presents for a pre-op exam today. She desires a hysterectomy due to endometriosis and heavy irregular menstrual cycles. Patient currently has a Nexplanon implanted and has questions regarding the removal. No other concerns at this time.

## 2021-07-28 NOTE — H&P (Signed)
PRE-OPERATIVE HISTORY AND PHYSICAL EXAM  PCP:  Lorre Munroe, NP Subjective:   HPI:  Tracy Medina is a 31 y.o. G0P0000.  Patient's last menstrual period was 06/22/2021 (approximate).  She presents today for a pre-op discussion and PE. Please see last note regarding her history of pelvic pain endometriosis and multiple treatment strategies which have failed.  She has again reiterated her desire for hysterectomy.  She is not interested in future childbirth.  She has the following symptoms: History of endometriosis, pelvic pain, menorrhagia  Review of Systems:   Constitutional: Denied constitutional symptoms, night sweats, recent illness, fatigue, fever, insomnia and weight loss.  Eyes: Denied eye symptoms, eye pain, photophobia, vision change and visual disturbance.  Ears/Nose/Throat/Neck: Denied ear, nose, throat or neck symptoms, hearing loss, nasal discharge, sinus congestion and sore throat.  Cardiovascular: Denied cardiovascular symptoms, arrhythmia, chest pain/pressure, edema, exercise intolerance, orthopnea and palpitations.  Respiratory: Denied pulmonary symptoms, asthma, pleuritic pain, productive sputum, cough, dyspnea and wheezing.  Gastrointestinal: Denied, gastro-esophageal reflux, melena, nausea and vomiting.  Genitourinary: See HPI for additional information.  Musculoskeletal: Denied musculoskeletal symptoms, stiffness, swelling, muscle weakness and myalgia.  Dermatologic: Denied dermatology symptoms, rash and scar.  Neurologic: Denied neurology symptoms, dizziness, headache, neck pain and syncope.  Psychiatric: Denied psychiatric symptoms, anxiety and depression.  Endocrine: Denied endocrine symptoms including hot flashes and night sweats.   OB History  Gravida Para Term Preterm AB Living  0 0 0 0 0 0  SAB IAB Ectopic Multiple Live Births  0 0 0 0 0    Past Medical History:  Diagnosis Date   Anxiety    Arthritis    Asthma    Depression     Endometriosis     Past Surgical History:  Procedure Laterality Date   CHOLECYSTECTOMY     LAPAROSCOPY     TONSILLECTOMY AND ADENOIDECTOMY     TYMPANOSTOMY TUBE PLACEMENT        SOCIAL HISTORY:  Social History   Tobacco Use  Smoking Status Never  Smokeless Tobacco Never   Social History   Substance and Sexual Activity  Alcohol Use Not Currently   Comment: social    Social History   Substance and Sexual Activity  Drug Use Never    Family History  Adopted: Yes  Problem Relation Age of Onset   Hypertension Mother     ALLERGIES:  Cefaclor  MEDS:   Current Outpatient Medications on File Prior to Visit  Medication Sig Dispense Refill   albuterol (VENTOLIN HFA) 108 (90 Base) MCG/ACT inhaler TAKE 2 PUFFS BY MOUTH EVERY 6 HOURS AS NEEDED FOR WHEEZE OR SHORTNESS OF BREATH 8.5 each 0   cyclobenzaprine (FLEXERIL) 10 MG tablet Take 1 tablet (10 mg total) by mouth 2 (two) times daily as needed for muscle spasms. 20 tablet 0   FLOVENT HFA 44 MCG/ACT inhaler INHALE 2 PUFFS INTO THE LUNGS TWICE A DAY 10.6 each 1   FLUoxetine (PROZAC) 40 MG capsule Take 1 capsule (40 mg total) by mouth daily. 90 capsule 2   ibuprofen (ADVIL) 800 MG tablet Take 1 tablet (800 mg total) by mouth every 8 (eight) hours as needed. 30 tablet 0   levalbuterol (XOPENEX) 0.31 MG/3ML nebulizer solution TAKE 3 MLS (0.31 MG TOTAL) BY NEBULIZATION EVERY 4 (FOUR) HOURS AS NEEDED FOR WHEEZING. 90 mL 1   traZODone (DESYREL) 50 MG tablet Take 0.5-1 tablets (25-50 mg total) by mouth at bedtime as needed for sleep. 30 tablet 0  etonogestrel (NEXPLANON) 68 MG IMPL implant 1 each by Subdermal route once.     No current facility-administered medications on file prior to visit.    No orders of the defined types were placed in this encounter.    Physical examination BP 136/90   Ht 5\' 7"  (1.702 m)   Wt 263 lb (119.3 kg)   LMP 06/22/2021 (Approximate) Comment: 6/22  BMI 41.19 kg/m   General NAD, Conversant   HEENT Atraumatic; Op clear with mmm.  Normo-cephalic. Pupils reactive. Anicteric sclerae  Thyroid/Neck Smooth without nodularity or enlargement. Normal ROM.  Neck Supple.  Skin No rashes, lesions or ulceration. Normal palpated skin turgor. No nodularity.  Breasts: No masses or discharge.  Symmetric.  No axillary adenopathy.  Lungs: Clear to auscultation.No rales or wheezes. Normal Respiratory effort, no retractions.  Heart: NSR.  No murmurs or rubs appreciated. No periferal edema  Abdomen: Soft.  Non-tender.  No masses.  No HSM. No hernia  Extremities: Moves all appropriately.  Normal ROM for age. No lymphadenopathy.  Neuro: Oriented to PPT.  Normal mood. Normal affect.     Pelvic: Unable to tolerate pelvic exams   Assessment:   G0P0000 Patient Active Problem List   Diagnosis Date Noted   Morbid obesity (HCC) 05/30/2021   Insomnia 05/24/2020   Elevated blood pressure reading in office without diagnosis of hypertension 05/24/2020   Endometriosis 02/29/2020   Iron deficiency 02/29/2020   Anxiety and depression 02/29/2020   Asthma 02/29/2020   OA (osteoarthritis of the spine) 02/29/2020    1. Endometriosis   2. Menorrhagia with irregular cycle   3. Pelvic pain in female    Failed multiple treatment strategies for endometriosis and pelvic pain. (Surgical diagnosis)   Plan:   Orders: No orders of the defined types were placed in this encounter.    1.  LAVH bilateral salpingectomy

## 2021-07-28 NOTE — Progress Notes (Signed)
PRE-OPERATIVE HISTORY AND PHYSICAL EXAM  PCP:  Lorre Munroe, NP Subjective:   HPI:  Tracy Medina is a 31 y.o. G0P0000.  Patient's last menstrual period was 06/22/2021 (approximate).  She presents today for a pre-op discussion and PE. Please see last note regarding her history of pelvic pain endometriosis and multiple treatment strategies which have failed.  She has again reiterated her desire for hysterectomy.  She is not interested in future childbirth.  She has the following symptoms: History of endometriosis, pelvic pain, menorrhagia  Review of Systems:   Constitutional: Denied constitutional symptoms, night sweats, recent illness, fatigue, fever, insomnia and weight loss.  Eyes: Denied eye symptoms, eye pain, photophobia, vision change and visual disturbance.  Ears/Nose/Throat/Neck: Denied ear, nose, throat or neck symptoms, hearing loss, nasal discharge, sinus congestion and sore throat.  Cardiovascular: Denied cardiovascular symptoms, arrhythmia, chest pain/pressure, edema, exercise intolerance, orthopnea and palpitations.  Respiratory: Denied pulmonary symptoms, asthma, pleuritic pain, productive sputum, cough, dyspnea and wheezing.  Gastrointestinal: Denied, gastro-esophageal reflux, melena, nausea and vomiting.  Genitourinary: See HPI for additional information.  Musculoskeletal: Denied musculoskeletal symptoms, stiffness, swelling, muscle weakness and myalgia.  Dermatologic: Denied dermatology symptoms, rash and scar.  Neurologic: Denied neurology symptoms, dizziness, headache, neck pain and syncope.  Psychiatric: Denied psychiatric symptoms, anxiety and depression.  Endocrine: Denied endocrine symptoms including hot flashes and night sweats.   OB History  Gravida Para Term Preterm AB Living  0 0 0 0 0 0  SAB IAB Ectopic Multiple Live Births  0 0 0 0 0    Past Medical History:  Diagnosis Date   Anxiety    Arthritis    Asthma    Depression     Endometriosis     Past Surgical History:  Procedure Laterality Date   CHOLECYSTECTOMY     LAPAROSCOPY     TONSILLECTOMY AND ADENOIDECTOMY     TYMPANOSTOMY TUBE PLACEMENT        SOCIAL HISTORY:  Social History   Tobacco Use  Smoking Status Never  Smokeless Tobacco Never   Social History   Substance and Sexual Activity  Alcohol Use Not Currently   Comment: social    Social History   Substance and Sexual Activity  Drug Use Never    Family History  Adopted: Yes  Problem Relation Age of Onset   Hypertension Mother     ALLERGIES:  Cefaclor  MEDS:   Current Outpatient Medications on File Prior to Visit  Medication Sig Dispense Refill   albuterol (VENTOLIN HFA) 108 (90 Base) MCG/ACT inhaler TAKE 2 PUFFS BY MOUTH EVERY 6 HOURS AS NEEDED FOR WHEEZE OR SHORTNESS OF BREATH 8.5 each 0   cyclobenzaprine (FLEXERIL) 10 MG tablet Take 1 tablet (10 mg total) by mouth 2 (two) times daily as needed for muscle spasms. 20 tablet 0   FLOVENT HFA 44 MCG/ACT inhaler INHALE 2 PUFFS INTO THE LUNGS TWICE A DAY 10.6 each 1   FLUoxetine (PROZAC) 40 MG capsule Take 1 capsule (40 mg total) by mouth daily. 90 capsule 2   ibuprofen (ADVIL) 800 MG tablet Take 1 tablet (800 mg total) by mouth every 8 (eight) hours as needed. 30 tablet 0   levalbuterol (XOPENEX) 0.31 MG/3ML nebulizer solution TAKE 3 MLS (0.31 MG TOTAL) BY NEBULIZATION EVERY 4 (FOUR) HOURS AS NEEDED FOR WHEEZING. 90 mL 1   traZODone (DESYREL) 50 MG tablet Take 0.5-1 tablets (25-50 mg total) by mouth at bedtime as needed for sleep. 30 tablet 0  etonogestrel (NEXPLANON) 68 MG IMPL implant 1 each by Subdermal route once.     No current facility-administered medications on file prior to visit.    Meds ordered this encounter  Medications   metroNIDAZOLE (FLAGYL) 500 MG tablet    Sig: Take 1 tablet (500 mg total) by mouth 2 (two) times daily. Begin 5 days prior to scheduled surgery as directed.    Dispense:  10 tablet    Refill:  0      Physical examination BP 136/90   Ht 5\' 7"  (1.702 m)   Wt 263 lb (119.3 kg)   LMP 06/22/2021 (Approximate) Comment: 6/22  BMI 41.19 kg/m   General NAD, Conversant  HEENT Atraumatic; Op clear with mmm.  Normo-cephalic. Pupils reactive. Anicteric sclerae  Thyroid/Neck Smooth without nodularity or enlargement. Normal ROM.  Neck Supple.  Skin No rashes, lesions or ulceration. Normal palpated skin turgor. No nodularity.  Breasts: No masses or discharge.  Symmetric.  No axillary adenopathy.  Lungs: Clear to auscultation.No rales or wheezes. Normal Respiratory effort, no retractions.  Heart: NSR.  No murmurs or rubs appreciated. No periferal edema  Abdomen: Soft.  Non-tender.  No masses.  No HSM. No hernia  Extremities: Moves all appropriately.  Normal ROM for age. No lymphadenopathy.  Neuro: Oriented to PPT.  Normal mood. Normal affect.     Pelvic: Unable to tolerate pelvic exams   Assessment:   G0P0000 Patient Active Problem List   Diagnosis Date Noted   Morbid obesity (HCC) 05/30/2021   Insomnia 05/24/2020   Elevated blood pressure reading in office without diagnosis of hypertension 05/24/2020   Endometriosis 02/29/2020   Iron deficiency 02/29/2020   Anxiety and depression 02/29/2020   Asthma 02/29/2020   OA (osteoarthritis of the spine) 02/29/2020    1. Preop examination   2. Endometriosis   3. Menorrhagia with irregular cycle   4. Pelvic pain in female    Failed multiple treatment strategies for endometriosis and pelvic pain. (Surgical diagnosis)   Plan:   Orders: Meds ordered this encounter  Medications   metroNIDAZOLE (FLAGYL) 500 MG tablet    Sig: Take 1 tablet (500 mg total) by mouth 2 (two) times daily. Begin 5 days prior to scheduled surgery as directed.    Dispense:  10 tablet    Refill:  0     1.  LAVH bilateral salpingectomy  Pre-op discussions regarding Risks and Benefits of her scheduled surgery.  LAVH The procedure of Laparoscopic Assisted  Vaginal Hysterectomy was described to the patient in detail.  We reviewed the rationale for Hysterectomy and the patient was again informed of other nonsurgical management possibilities for her condition.  She has considered these other options, and desires a Hysterectomy.  We have reviewed the fact that Hysterectomy is permanent and that following the procedure she will not be able to become pregnant or bear children.  We have discussed the following risk factors specifically and the patient has also been informed that additional complications not mentioned may develop:  Damage to bowel, bladder, ureters or to other internal organs, bleeding, infection and the risk from anesthesia.  We have discussed the procedure itself in detail and she has an informed understanding of this surgery.  We have also discussed the recovery period in which physical and sexual activity will be restricted for a varying degree of time, often 3 - 6 weeks. The Laparoscopic Portion of Hysterectomy has also been reviewed with the patient.  She understands how the laparoscope facilitates the  procedure.  We have discussed the abdominal incisions and punctures that will be used.  We have also reviewed the increased Operating Room time often accompanying LAVH.  The slightly increased risk of complications secondary to abdominal punctures, and use of laparoscopic instrumentation has also been discussed in detail.I have answered all of her questions and I believe the patient has an informed understanding of the procedure of Laparoscopic Assisted Vaginal Hysterectomy. Oophorectomy The option of Oophorectomy has been discussed with the patient.  Detailed risk/benefits have been reviewed.  The risks discussed include, but are not limited to, hemorrhage, infection, damage to ureter or other internal organ, and Ovarian Remnant Syndrome.  The benefits include a significant decrease in the risk of Ovarian Cancer and in benign Ovarian disease.  The  risk of Ovarian CA has been estimated at 1 in 82.  This is a relatively small risk.  However, should Ovarian CA develop, it is often found late in the course of the disease.  We have also discussed the role of inheritance in the development of Ovarian disease.  Some women, who have close relatives with Ovarian CA, have a higher than 1 in 70 risk of Ovarian CA.  The benefits of Estrogen replacement therapy following Oophorectomy has been stressed.  If she is premenopausal, we have discussed the fact that this procedure will make her permanently sterile and that premature menopause will result if no ERT is begun.  I have answered all of her questions, and I believe that she has an adequate and informed understanding of the risks and benefits of Oophorectomy. She has decided to keep her ovaries despite knowing that she has endometriosis.  I have quoted an approximate return to surgery for endometriosis after hysterectomy at 10%.  I spent 34 minutes involved in the care of this patient preparing to see the patient by obtaining and reviewing her medical history (including labs, imaging tests and prior procedures), documenting clinical information in the electronic health record (EHR), counseling and coordinating care plans, writing and sending prescriptions, ordering tests or procedures and in direct communicating with the patient and medical staff discussing pertinent items from her history and physical exam.   Finis Bud, M.D. 07/28/2021 10:43 AM

## 2021-07-28 NOTE — H&P (View-Only) (Signed)
PRE-OPERATIVE HISTORY AND PHYSICAL EXAM  PCP:  Lorre Munroe, NP Subjective:   HPI:  Tracy Medina is a 31 y.o. G0P0000.  Patient's last menstrual period was 06/22/2021 (approximate).  She presents today for a pre-op discussion and PE. Please see last note regarding her history of pelvic pain endometriosis and multiple treatment strategies which have failed.  She has again reiterated her desire for hysterectomy.  She is not interested in future childbirth.  She has the following symptoms: History of endometriosis, pelvic pain, menorrhagia  Review of Systems:   Constitutional: Denied constitutional symptoms, night sweats, recent illness, fatigue, fever, insomnia and weight loss.  Eyes: Denied eye symptoms, eye pain, photophobia, vision change and visual disturbance.  Ears/Nose/Throat/Neck: Denied ear, nose, throat or neck symptoms, hearing loss, nasal discharge, sinus congestion and sore throat.  Cardiovascular: Denied cardiovascular symptoms, arrhythmia, chest pain/pressure, edema, exercise intolerance, orthopnea and palpitations.  Respiratory: Denied pulmonary symptoms, asthma, pleuritic pain, productive sputum, cough, dyspnea and wheezing.  Gastrointestinal: Denied, gastro-esophageal reflux, melena, nausea and vomiting.  Genitourinary: See HPI for additional information.  Musculoskeletal: Denied musculoskeletal symptoms, stiffness, swelling, muscle weakness and myalgia.  Dermatologic: Denied dermatology symptoms, rash and scar.  Neurologic: Denied neurology symptoms, dizziness, headache, neck pain and syncope.  Psychiatric: Denied psychiatric symptoms, anxiety and depression.  Endocrine: Denied endocrine symptoms including hot flashes and night sweats.   OB History  Gravida Para Term Preterm AB Living  0 0 0 0 0 0  SAB IAB Ectopic Multiple Live Births  0 0 0 0 0    Past Medical History:  Diagnosis Date   Anxiety    Arthritis    Asthma    Depression     Endometriosis     Past Surgical History:  Procedure Laterality Date   CHOLECYSTECTOMY     LAPAROSCOPY     TONSILLECTOMY AND ADENOIDECTOMY     TYMPANOSTOMY TUBE PLACEMENT        SOCIAL HISTORY:  Social History   Tobacco Use  Smoking Status Never  Smokeless Tobacco Never   Social History   Substance and Sexual Activity  Alcohol Use Not Currently   Comment: social    Social History   Substance and Sexual Activity  Drug Use Never    Family History  Adopted: Yes  Problem Relation Age of Onset   Hypertension Mother     ALLERGIES:  Cefaclor  MEDS:   Current Outpatient Medications on File Prior to Visit  Medication Sig Dispense Refill   albuterol (VENTOLIN HFA) 108 (90 Base) MCG/ACT inhaler TAKE 2 PUFFS BY MOUTH EVERY 6 HOURS AS NEEDED FOR WHEEZE OR SHORTNESS OF BREATH 8.5 each 0   cyclobenzaprine (FLEXERIL) 10 MG tablet Take 1 tablet (10 mg total) by mouth 2 (two) times daily as needed for muscle spasms. 20 tablet 0   FLOVENT HFA 44 MCG/ACT inhaler INHALE 2 PUFFS INTO THE LUNGS TWICE A DAY 10.6 each 1   FLUoxetine (PROZAC) 40 MG capsule Take 1 capsule (40 mg total) by mouth daily. 90 capsule 2   ibuprofen (ADVIL) 800 MG tablet Take 1 tablet (800 mg total) by mouth every 8 (eight) hours as needed. 30 tablet 0   levalbuterol (XOPENEX) 0.31 MG/3ML nebulizer solution TAKE 3 MLS (0.31 MG TOTAL) BY NEBULIZATION EVERY 4 (FOUR) HOURS AS NEEDED FOR WHEEZING. 90 mL 1   traZODone (DESYREL) 50 MG tablet Take 0.5-1 tablets (25-50 mg total) by mouth at bedtime as needed for sleep. 30 tablet 0  etonogestrel (NEXPLANON) 68 MG IMPL implant 1 each by Subdermal route once.     No current facility-administered medications on file prior to visit.    No orders of the defined types were placed in this encounter.    Physical examination BP 136/90   Ht 5\' 7"  (1.702 m)   Wt 263 lb (119.3 kg)   LMP 06/22/2021 (Approximate) Comment: 6/22  BMI 41.19 kg/m   General NAD, Conversant   HEENT Atraumatic; Op clear with mmm.  Normo-cephalic. Pupils reactive. Anicteric sclerae  Thyroid/Neck Smooth without nodularity or enlargement. Normal ROM.  Neck Supple.  Skin No rashes, lesions or ulceration. Normal palpated skin turgor. No nodularity.  Breasts: No masses or discharge.  Symmetric.  No axillary adenopathy.  Lungs: Clear to auscultation.No rales or wheezes. Normal Respiratory effort, no retractions.  Heart: NSR.  No murmurs or rubs appreciated. No periferal edema  Abdomen: Soft.  Non-tender.  No masses.  No HSM. No hernia  Extremities: Moves all appropriately.  Normal ROM for age. No lymphadenopathy.  Neuro: Oriented to PPT.  Normal mood. Normal affect.     Pelvic: Unable to tolerate pelvic exams   Assessment:   G0P0000 Patient Active Problem List   Diagnosis Date Noted   Morbid obesity (HCC) 05/30/2021   Insomnia 05/24/2020   Elevated blood pressure reading in office without diagnosis of hypertension 05/24/2020   Endometriosis 02/29/2020   Iron deficiency 02/29/2020   Anxiety and depression 02/29/2020   Asthma 02/29/2020   OA (osteoarthritis of the spine) 02/29/2020    1. Endometriosis   2. Menorrhagia with irregular cycle   3. Pelvic pain in female    Failed multiple treatment strategies for endometriosis and pelvic pain. (Surgical diagnosis)   Plan:   Orders: No orders of the defined types were placed in this encounter.    1.  LAVH bilateral salpingectomy

## 2021-08-07 ENCOUNTER — Telehealth: Payer: Self-pay | Admitting: Obstetrics and Gynecology

## 2021-08-07 NOTE — Telephone Encounter (Signed)
VM from patient's mother, Thurston Hole. I confirmed that I am able to discuss with her per the signed DPR.  Calling to check on prior auth being denied from [facility]. I adv that the insurance is requesting a P2P with Logan Bores do go over pt case. I adv that Logan Bores was out of the office last week and that he is in the OR all day today. I adv that once I am able to discuss with him the case and schedule the phone call I will let them know. I also adv that I had sent him a msg last week regarding this case as well.

## 2021-08-14 ENCOUNTER — Ambulatory Visit
Admission: RE | Admit: 2021-08-14 | Discharge: 2021-08-14 | Disposition: A | Discharge status: Home / Self Care | Payer: 59 | Source: Ambulatory Visit | Attending: Obstetrics and Gynecology | Admitting: Obstetrics and Gynecology

## 2021-08-14 VITALS — Ht 67.0 in | Wt 260.0 lb

## 2021-08-14 DIAGNOSIS — Z01812 Encounter for preprocedural laboratory examination: Secondary | ICD-10-CM

## 2021-08-14 NOTE — Patient Instructions (Addendum)
Your procedure is scheduled on: Monday August 21, 2021. Report to Day Surgery inside [facility] 2nd floor, stop by admissions desk before getting on elevator. To find out your arrival time please call [phone] between 1PM - 3PM on Friday August 18, 2021.  Remember: Instructions that are not followed completely may result in serious medical risk,  up to and including death, or upon the discretion of your surgeon and anesthesiologist your  surgery may need to be rescheduled.     _X__ 1. Do not eat food after midnight the night before your procedure.                 No chewing gum or hard candies.  __X__2.  On the morning of surgery brush your teeth with toothpaste and water, you                may rinse your mouth with mouthwash if you wish.  Do not swallow any toothpaste or mouthwash.     _X__ 3.  No Alcohol for 24 hours before or after surgery.   _X__ 4.  Do Not Smoke or use e-cigarettes For 24 Hours Prior to Your Surgery.                 Do not use any chewable tobacco products for at least 6 hours prior to                 Surgery.  _X__  5.  Do not use any recreational drugs (marijuana, cocaine, heroin, ecstasy, MDMA or other)                For at least one week prior to your surgery.  Combination of these drugs with anesthesia                May have life threatening results.  ____  6.  Bring all medications with you on the day of surgery if instructed.   __X__  7.  Notify your doctor if there is any change in your medical condition      (cold, fever, infections).     Do not wear jewelry, make-up, hairpins, clips or nail polish. Do not wear lotions, powders, or perfumes. You may wear deodorant. Do not shave 48 hours prior to surgery. Men may shave face and neck. Do not bring valuables to the hospital.    [facility] is not responsible for any belongings or valuables.  Contacts, dentures or bridgework may not be worn into surgery. Leave your suitcase  in the car. After surgery it may be brought to your room. For patients admitted to the hospital, discharge time is determined by your treatment team.   Patients discharged the day of surgery will not be allowed to drive home.   Make arrangements for someone to be with you for the first 24 hours of your Same Day Discharge.  __X__ Take these medicines the morning of surgery with A SIP OF WATER:    1. FLUoxetine (PROZAC) 40 MG capsule  2.   3.   4.  5.  6.  ____ Fleet Enema (as directed)   ____ Use CHG Soap (or wipes) as directed  ____ Use Benzoyl Peroxide Gel as instructed  __X__ Use inhalers on the day of surgery  albuterol (VENTOLIN HFA) 108 (90 Base) MCG/ACT inhaler  FLOVENT HFA 44 MCG/ACT inhaler ____ Stop metformin 2 days prior to surgery    ____ Take 1/2 of usual insulin dose the night  before surgery. No insulin the morning          of surgery.   ____ Call your PCP, cardiologist, or Pulmonologist if taking Coumadin/Plavix/aspirin and ask when to stop before your surgery.   __X__ One Week prior to surgery- Stop Anti-inflammatories such as Ibuprofen, Aleve, Advil, Motrin, meloxicam (MOBIC), diclofenac, etodolac, ketorolac, Toradol, Daypro, piroxicam, Goody's or BC powders. OK TO USE TYLENOL IF NEEDED   __X__ One week prior to surgery stop all supplements and over the counter medications until after surgery.    ____ Bring C-Pap to the hospital.    If you have any questions regarding your pre-procedure instructions,  Please call Pre-admit Testing at [phone]

## 2021-08-17 ENCOUNTER — Encounter
Admission: RE | Admit: 2021-08-17 | Discharge: 2021-08-17 | Disposition: A | Discharge status: Home / Self Care | Payer: 59 | Source: Ambulatory Visit | Attending: Obstetrics and Gynecology | Admitting: Obstetrics and Gynecology

## 2021-08-17 DIAGNOSIS — Z01818 Encounter for other preprocedural examination: Secondary | ICD-10-CM

## 2021-08-17 DIAGNOSIS — Z01812 Encounter for preprocedural laboratory examination: Secondary | ICD-10-CM | POA: Diagnosis present

## 2021-08-17 LAB — TYPE AND SCREEN
ABO/RH(D): O POS
Antibody Screen: NEGATIVE

## 2021-08-21 ENCOUNTER — Other Ambulatory Visit: Payer: Self-pay

## 2021-08-21 ENCOUNTER — Encounter
Admission: RE | Disposition: A | Discharge status: Home / Self Care | Payer: Self-pay | Source: Ambulatory Visit | Attending: Obstetrics and Gynecology

## 2021-08-21 ENCOUNTER — Ambulatory Visit: Payer: 59 | Admitting: Certified Registered"

## 2021-08-21 ENCOUNTER — Ambulatory Visit
Admission: RE | Admit: 2021-08-21 | Discharge: 2021-08-21 | Disposition: A | Discharge status: Home / Self Care | Payer: 59 | Source: Ambulatory Visit | Attending: Obstetrics and Gynecology | Admitting: Obstetrics and Gynecology

## 2021-08-21 ENCOUNTER — Encounter: Payer: Self-pay | Admitting: Obstetrics and Gynecology

## 2021-08-21 DIAGNOSIS — N9971 Accidental puncture and laceration of a genitourinary system organ or structure during a genitourinary system procedure: Secondary | ICD-10-CM | POA: Diagnosis not present

## 2021-08-21 DIAGNOSIS — Z01812 Encounter for preprocedural laboratory examination: Secondary | ICD-10-CM

## 2021-08-21 DIAGNOSIS — N809 Endometriosis, unspecified: Secondary | ICD-10-CM | POA: Insufficient documentation

## 2021-08-21 DIAGNOSIS — S3141XA Laceration without foreign body of vagina and vulva, initial encounter: Secondary | ICD-10-CM | POA: Insufficient documentation

## 2021-08-21 DIAGNOSIS — J45909 Unspecified asthma, uncomplicated: Secondary | ICD-10-CM | POA: Diagnosis not present

## 2021-08-21 DIAGNOSIS — F32A Depression, unspecified: Secondary | ICD-10-CM | POA: Diagnosis not present

## 2021-08-21 DIAGNOSIS — F419 Anxiety disorder, unspecified: Secondary | ICD-10-CM | POA: Diagnosis not present

## 2021-08-21 DIAGNOSIS — N921 Excessive and frequent menstruation with irregular cycle: Secondary | ICD-10-CM | POA: Insufficient documentation

## 2021-08-21 DIAGNOSIS — Z6841 Body Mass Index (BMI) 40.0 and over, adult: Secondary | ICD-10-CM | POA: Insufficient documentation

## 2021-08-21 DIAGNOSIS — N838 Other noninflammatory disorders of ovary, fallopian tube and broad ligament: Secondary | ICD-10-CM | POA: Insufficient documentation

## 2021-08-21 DIAGNOSIS — M1990 Unspecified osteoarthritis, unspecified site: Secondary | ICD-10-CM | POA: Insufficient documentation

## 2021-08-21 DIAGNOSIS — R102 Pelvic and perineal pain: Secondary | ICD-10-CM | POA: Diagnosis not present

## 2021-08-21 DIAGNOSIS — N920 Excessive and frequent menstruation with regular cycle: Secondary | ICD-10-CM | POA: Diagnosis not present

## 2021-08-21 DIAGNOSIS — Y838 Other surgical procedures as the cause of abnormal reaction of the patient, or of later complication, without mention of misadventure at the time of the procedure: Secondary | ICD-10-CM | POA: Diagnosis not present

## 2021-08-21 HISTORY — PX: TOTAL LAPAROSCOPIC HYSTERECTOMY WITH SALPINGECTOMY: SHX6742

## 2021-08-21 LAB — POCT PREGNANCY, URINE: Preg Test, Ur: NEGATIVE

## 2021-08-21 LAB — ABO/RH: ABO/RH(D): O POS

## 2021-08-21 SURGERY — HYSTERECTOMY, TOTAL, LAPAROSCOPIC, WITH SALPINGECTOMY
Anesthesia: General | Laterality: Bilateral

## 2021-08-21 MED ORDER — MIDAZOLAM HCL 2 MG/2ML IJ SOLN
INTRAMUSCULAR | Status: AC
  Filled 2021-08-21: qty 2

## 2021-08-21 MED ORDER — BUPIVACAINE HCL (PF) 0.5 % IJ SOLN
INTRAMUSCULAR | Status: AC
  Filled 2021-08-21: qty 30

## 2021-08-21 MED ORDER — SIMETHICONE 80 MG PO CHEW: 80.0000 mg | CHEWABLE_TABLET | Freq: Four times a day (QID) | ORAL | Status: DC | PRN

## 2021-08-21 MED ORDER — LIDOCAINE HCL (CARDIAC) PF 100 MG/5ML IV SOSY
PREFILLED_SYRINGE | INTRAVENOUS | Status: DC | PRN
  Administered 2021-08-21: 80 mg via INTRAVENOUS

## 2021-08-21 MED ORDER — FAMOTIDINE 20 MG PO TABS: 20.0000 mg | ORAL_TABLET | Freq: Once | ORAL | Status: AC

## 2021-08-21 MED ORDER — KETAMINE HCL 50 MG/5ML IJ SOSY
PREFILLED_SYRINGE | INTRAMUSCULAR | Status: AC
  Filled 2021-08-21: qty 5

## 2021-08-21 MED ORDER — OXYCODONE-ACETAMINOPHEN 5-325 MG PO TABS: 1.0000 | ORAL_TABLET | ORAL | Status: DC | PRN

## 2021-08-21 MED ORDER — FAMOTIDINE 20 MG PO TABS
ORAL_TABLET | ORAL | Status: AC
  Administered 2021-08-21: 20 mg via ORAL
  Filled 2021-08-21: qty 1

## 2021-08-21 MED ORDER — LIDOCAINE HCL (PF) 2 % IJ SOLN
INTRAMUSCULAR | Status: AC
  Filled 2021-08-21: qty 5

## 2021-08-21 MED ORDER — PROMETHAZINE HCL 25 MG/ML IJ SOLN
INTRAMUSCULAR | Status: AC
  Filled 2021-08-21: qty 1

## 2021-08-21 MED ORDER — KETAMINE HCL 10 MG/ML IJ SOLN
INTRAMUSCULAR | Status: DC | PRN
Start: 2021-08-21 — End: 2021-08-21
  Administered 2021-08-21: 30 mg via INTRAVENOUS
  Administered 2021-08-21: 20 mg via INTRAVENOUS

## 2021-08-21 MED ORDER — MIDAZOLAM HCL 2 MG/2ML IJ SOLN
INTRAMUSCULAR | Status: DC | PRN
  Administered 2021-08-21: 2 mg via INTRAVENOUS

## 2021-08-21 MED ORDER — SODIUM CHLORIDE (PF) 0.9 % IJ SOLN
INTRAMUSCULAR | Status: AC
  Filled 2021-08-21: qty 50

## 2021-08-21 MED ORDER — LACTATED RINGERS IV SOLN: INTRAVENOUS | Status: DC

## 2021-08-21 MED ORDER — ROCURONIUM BROMIDE 100 MG/10ML IV SOLN
INTRAVENOUS | Status: DC | PRN
  Administered 2021-08-21: 60 mg via INTRAVENOUS
  Administered 2021-08-21: 20 mg via INTRAVENOUS

## 2021-08-21 MED ORDER — PROMETHAZINE HCL 25 MG/ML IJ SOLN: 6.2500 mg | INTRAMUSCULAR | Status: DC | PRN

## 2021-08-21 MED ORDER — CHLORHEXIDINE GLUCONATE 0.12 % MT SOLN: 15.0000 mL | Freq: Once | OROMUCOSAL | Status: AC

## 2021-08-21 MED ORDER — IBUPROFEN 800 MG PO TABS: 800.0000 mg | ORAL_TABLET | Freq: Three times a day (TID) | ORAL | 0 refills | Status: DC | PRN

## 2021-08-21 MED ORDER — ONDANSETRON HCL 4 MG/2ML IJ SOLN
INTRAMUSCULAR | Status: AC
  Filled 2021-08-21: qty 4

## 2021-08-21 MED ORDER — FENTANYL CITRATE (PF) 100 MCG/2ML IJ SOLN
INTRAMUSCULAR | Status: DC | PRN
  Administered 2021-08-21: 100 ug via INTRAVENOUS

## 2021-08-21 MED ORDER — HYDROCODONE-ACETAMINOPHEN 5-325 MG PO TABS: 1.0000 | ORAL_TABLET | Freq: Four times a day (QID) | ORAL | 0 refills | Status: DC | PRN

## 2021-08-21 MED ORDER — BUPIVACAINE HCL 0.5 % IJ SOLN
INTRAMUSCULAR | Status: DC | PRN
  Administered 2021-08-21: 19 mL

## 2021-08-21 MED ORDER — ORAL CARE MOUTH RINSE: 15.0000 mL | Freq: Once | OROMUCOSAL | Status: AC

## 2021-08-21 MED ORDER — ACETAMINOPHEN 500 MG PO TABS: 1000.0000 mg | ORAL_TABLET | Freq: Once | ORAL | Status: DC | PRN

## 2021-08-21 MED ORDER — IBUPROFEN 600 MG PO TABS: 600.0000 mg | ORAL_TABLET | Freq: Four times a day (QID) | ORAL | Status: DC

## 2021-08-21 MED ORDER — FENTANYL CITRATE (PF) 100 MCG/2ML IJ SOLN
INTRAMUSCULAR | Status: AC
  Administered 2021-08-21: 25 ug via INTRAVENOUS
  Filled 2021-08-21: qty 2

## 2021-08-21 MED ORDER — PROPOFOL 10 MG/ML IV BOLUS
INTRAVENOUS | Status: DC | PRN
  Administered 2021-08-21: 200 mg via INTRAVENOUS

## 2021-08-21 MED ORDER — 0.9 % SODIUM CHLORIDE (POUR BTL) OPTIME
TOPICAL | Status: DC | PRN
  Administered 2021-08-21: 100 mL

## 2021-08-21 MED ORDER — DEXAMETHASONE SODIUM PHOSPHATE 10 MG/ML IJ SOLN
INTRAMUSCULAR | Status: DC | PRN
  Administered 2021-08-21: 10 mg via INTRAVENOUS

## 2021-08-21 MED ORDER — FENTANYL CITRATE (PF) 100 MCG/2ML IJ SOLN
25.0000 ug | INTRAMUSCULAR | Status: DC | PRN
  Administered 2021-08-21 (×2): 25 ug via INTRAVENOUS
  Administered 2021-08-21: 50 ug via INTRAVENOUS

## 2021-08-21 MED ORDER — SUGAMMADEX SODIUM 200 MG/2ML IV SOLN
INTRAVENOUS | Status: DC | PRN
  Administered 2021-08-21: 220 mg via INTRAVENOUS

## 2021-08-21 MED ORDER — FENTANYL CITRATE (PF) 100 MCG/2ML IJ SOLN
INTRAMUSCULAR | Status: AC
  Filled 2021-08-21: qty 2

## 2021-08-21 MED ORDER — ROCURONIUM BROMIDE 10 MG/ML (PF) SYRINGE
PREFILLED_SYRINGE | INTRAVENOUS | Status: AC
  Filled 2021-08-21: qty 10

## 2021-08-21 MED ORDER — DEXMEDETOMIDINE (PRECEDEX) IN NS 20 MCG/5ML (4 MCG/ML) IV SYRINGE
PREFILLED_SYRINGE | INTRAVENOUS | Status: DC | PRN
  Administered 2021-08-21: 8 ug via INTRAVENOUS
  Administered 2021-08-21: 4 ug via INTRAVENOUS
  Administered 2021-08-21: 8 ug via INTRAVENOUS

## 2021-08-21 MED ORDER — CEFAZOLIN SODIUM-DEXTROSE 2-4 GM/100ML-% IV SOLN
INTRAVENOUS | Status: AC
  Filled 2021-08-21: qty 100

## 2021-08-21 MED ORDER — VASOPRESSIN 20 UNIT/ML IV SOLN
INTRAVENOUS | Status: AC
  Filled 2021-08-21: qty 1

## 2021-08-21 MED ORDER — OXYCODONE-ACETAMINOPHEN 5-325 MG PO TABS
ORAL_TABLET | ORAL | Status: AC
  Administered 2021-08-21: 2 via ORAL
  Filled 2021-08-21: qty 2

## 2021-08-21 MED ORDER — POVIDONE-IODINE 10 % EX SWAB
2.0000 | Freq: Once | CUTANEOUS | Status: AC
  Administered 2021-08-21: 2 via TOPICAL

## 2021-08-21 MED ORDER — CEFAZOLIN SODIUM-DEXTROSE 2-4 GM/100ML-% IV SOLN
2.0000 g | INTRAVENOUS | Status: AC
  Administered 2021-08-21: 2 g via INTRAVENOUS

## 2021-08-21 MED ORDER — PHENYLEPHRINE HCL (PRESSORS) 10 MG/ML IV SOLN
INTRAVENOUS | Status: DC | PRN
  Administered 2021-08-21 (×3): 100 ug via INTRAVENOUS

## 2021-08-21 MED ORDER — PROPOFOL 10 MG/ML IV BOLUS
INTRAVENOUS | Status: AC
  Filled 2021-08-21: qty 20

## 2021-08-21 MED ORDER — IBUPROFEN 600 MG PO TABS
ORAL_TABLET | ORAL | Status: AC
  Administered 2021-08-21: 600 mg via ORAL
  Filled 2021-08-21: qty 1

## 2021-08-21 MED ORDER — CHLORHEXIDINE GLUCONATE 0.12 % MT SOLN
OROMUCOSAL | Status: AC
  Administered 2021-08-21: 15 mL via OROMUCOSAL
  Filled 2021-08-21: qty 15

## 2021-08-21 MED ORDER — DEXAMETHASONE SODIUM PHOSPHATE 10 MG/ML IJ SOLN
INTRAMUSCULAR | Status: AC
  Filled 2021-08-21: qty 1

## 2021-08-21 SURGICAL SUPPLY — 64 items
ADH LQ OCL WTPRF AMP STRL LF (MISCELLANEOUS) ×1
ADH SKN CLS APL DERMABOND .7 (GAUZE/BANDAGES/DRESSINGS) ×1
ADHESIVE MASTISOL STRL (MISCELLANEOUS) ×2 IMPLANT
APL PRP STRL LF DISP 70% ISPRP (MISCELLANEOUS) ×1
BAG DRN RND TRDRP ANRFLXCHMBR (UROLOGICAL SUPPLIES) ×1
BAG URINE DRAIN 2000ML AR STRL (UROLOGICAL SUPPLIES) ×2 IMPLANT
BLADE SURG 10 STRL SS SAFETY (BLADE) ×2 IMPLANT
BLADE SURG SZ11 CARB STEEL (BLADE) ×2 IMPLANT
CATH FOLEY 2WAY  5CC 16FR (CATHETERS) ×1
CATH FOLEY 2WAY 5CC 16FR (CATHETERS) ×1
CATH URTH 16FR FL 2W BLN LF (CATHETERS) ×1 IMPLANT
CHLORAPREP W/TINT 26 (MISCELLANEOUS) ×2 IMPLANT
DERMABOND ADVANCED (GAUZE/BANDAGES/DRESSINGS) ×1
DERMABOND ADVANCED .7 DNX12 (GAUZE/BANDAGES/DRESSINGS) ×1 IMPLANT
DEVICE SUTURE ENDOST 10MM (ENDOMECHANICALS) ×1 IMPLANT
ELECT REM PT RETURN 9FT ADLT (ELECTROSURGICAL) ×2
ELECTRODE REM PT RTRN 9FT ADLT (ELECTROSURGICAL) ×1 IMPLANT
GAUZE 4X4 16PLY ~~LOC~~+RFID DBL (SPONGE) ×4 IMPLANT
GLOVE BIOGEL PI ORTHO PRO 7.5 (GLOVE) ×1
GLOVE PI ORTHO PRO STRL 7.5 (GLOVE) ×1 IMPLANT
GOWN STRL REUS W/ TWL LRG LVL3 (GOWN DISPOSABLE) ×2 IMPLANT
GOWN STRL REUS W/ TWL XL LVL3 (GOWN DISPOSABLE) ×2 IMPLANT
GOWN STRL REUS W/TWL LRG LVL3 (GOWN DISPOSABLE) ×4
GOWN STRL REUS W/TWL XL LVL3 (GOWN DISPOSABLE) ×4
GRASPER SUT TROCAR 14GX15 (MISCELLANEOUS) ×1 IMPLANT
HANDLE YANKAUER SUCT BULB TIP (MISCELLANEOUS) IMPLANT
IRRIGATION STRYKERFLOW (MISCELLANEOUS) IMPLANT
IRRIGATOR STRYKERFLOW (MISCELLANEOUS) ×2
IV LACTATED RINGERS 1000ML (IV SOLUTION) ×2 IMPLANT
KIT PINK PAD W/HEAD ARE REST (MISCELLANEOUS) ×2
KIT PINK PAD W/HEAD ARM REST (MISCELLANEOUS) ×1 IMPLANT
KIT TURNOVER CYSTO (KITS) ×2 IMPLANT
LIGASURE LAP MARYLAND 5MM 37CM (ELECTROSURGICAL) ×1 IMPLANT
MANIFOLD NEPTUNE II (INSTRUMENTS) ×2 IMPLANT
MANIPULATOR VCARE SML CRV RETR (MISCELLANEOUS) ×1 IMPLANT
NDL SPNL 22GX3.5 QUINCKE BK (NEEDLE) ×1 IMPLANT
NEEDLE SPNL 22GX3.5 QUINCKE BK (NEEDLE) ×2 IMPLANT
OCCLUDER COLPOPNEUMO (BALLOONS) ×1 IMPLANT
PACK BASIN MINOR ARMC (MISCELLANEOUS) ×2 IMPLANT
PACK GYN LAPAROSCOPIC (MISCELLANEOUS) ×2 IMPLANT
PAD OB MATERNITY 4.3X12.25 (PERSONAL CARE ITEMS) ×2 IMPLANT
PAD PREP 24X41 OB/GYN DISP (PERSONAL CARE ITEMS) ×2 IMPLANT
PENCIL SMOKE EVACUATOR (MISCELLANEOUS) ×2 IMPLANT
RETRACTOR PHONTONGUIDE ADAPT (ADAPTER) IMPLANT
SCISSORS METZENBAUM CVD 33 (INSTRUMENTS) ×1 IMPLANT
SCRUB CHG 4% DYNA-HEX 4OZ (MISCELLANEOUS) ×2 IMPLANT
SLEEVE ENDOPATH XCEL 5M (ENDOMECHANICALS) ×4 IMPLANT
SOLUTION ELECTROLUBE (MISCELLANEOUS) IMPLANT
SPONGE T-LAP 18X18 ~~LOC~~+RFID (SPONGE) ×2 IMPLANT
STRIP CLOSURE SKIN 1/2X4 (GAUZE/BANDAGES/DRESSINGS) IMPLANT
SUT ENDO VLOC 180-0-8IN (SUTURE) ×1 IMPLANT
SUT VIC AB 0 CT1 27 (SUTURE) ×4
SUT VIC AB 0 CT1 27XCR 8 STRN (SUTURE) ×2 IMPLANT
SUT VIC AB 0 CT1 36 (SUTURE) ×6 IMPLANT
SUT VIC AB 4-0 FS2 27 (SUTURE) ×2 IMPLANT
SYR 10ML LL (SYRINGE) ×2 IMPLANT
SYR 50ML LL SCALE MARK (SYRINGE) ×1 IMPLANT
SYR CONTROL 10ML LL (SYRINGE) ×2 IMPLANT
TAPE TRANSPORE STRL 2 31045 (GAUZE/BANDAGES/DRESSINGS) ×2 IMPLANT
TROCAR 5M 150ML BLDLS (TROCAR) ×1 IMPLANT
TROCAR XCEL NON-BLD 5MMX100MML (ENDOMECHANICALS) ×2 IMPLANT
TROCAR Z-THRD FIOS HNDL 11X100 (TROCAR) ×1 IMPLANT
TUBING EVAC SMOKE HEATED PNEUM (TUBING) ×2 IMPLANT
WATER STERILE IRR 500ML POUR (IV SOLUTION) ×2 IMPLANT

## 2021-08-21 NOTE — Anesthesia Procedure Notes (Signed)
Procedure Name: Intubation Date/Time: 08/21/2021 10:09 AM  Performed by: Philbert Riser, CRNAPre-anesthesia Checklist: Patient identified, Patient being monitored, Timeout performed, Emergency Drugs available and Suction available Patient Re-evaluated:Patient Re-evaluated prior to induction Oxygen Delivery Method: Circle system utilized Preoxygenation: Pre-oxygenation with 100% oxygen Induction Type: IV induction Ventilation: Mask ventilation without difficulty Laryngoscope Size: 3 and McGraph Grade View: Grade I Tube type: Oral Tube size: 7.0 mm Number of attempts: 1 Airway Equipment and Method: Stylet Placement Confirmation: ETT inserted through vocal cords under direct vision, positive ETCO2 and breath sounds checked- equal and bilateral Secured at: 21 cm Tube secured with: Tape Dental Injury: Teeth and Oropharynx as per pre-operative assessment

## 2021-08-21 NOTE — Anesthesia Preprocedure Evaluation (Signed)
Anesthesia Evaluation  Patient identified by MRN, date of birth, ID band Patient awake    Reviewed: Allergy & Precautions, H&P , NPO status , Patient's Chart, lab work & pertinent test results, reviewed documented beta blocker date and time   History of Anesthesia Complications Negative for: history of anesthetic complications  Airway Mallampati: II  TM Distance: >3 FB Neck ROM: full    Dental  (+) Dental Advidsory Given, Teeth Intact   Pulmonary neg shortness of breath, asthma , neg recent URI,    Pulmonary exam normal breath sounds clear to auscultation       Cardiovascular Exercise Tolerance: Good negative cardio ROS Normal cardiovascular exam Rhythm:regular Rate:Normal     Neuro/Psych PSYCHIATRIC DISORDERS Anxiety Depression negative neurological ROS     GI/Hepatic negative GI ROS, Neg liver ROS,   Endo/Other  neg diabetesMorbid obesity  Renal/GU negative Renal ROS  negative genitourinary   Musculoskeletal   Abdominal   Peds  Hematology negative hematology ROS (+)   Anesthesia Other Findings Past Medical History: No date: Anxiety No date: Arthritis No date: Asthma No date: Depression No date: Endometriosis   Reproductive/Obstetrics negative OB ROS                             Anesthesia Physical Anesthesia Plan  ASA: 3  Anesthesia Plan: General   Post-op Pain Management:    Induction: Intravenous  PONV Risk Score and Plan: 3 and Ondansetron, Dexamethasone, Midazolam, Treatment may vary due to age or medical condition and Promethazine  Airway Management Planned: Oral ETT  Additional Equipment:   Intra-op Plan:   Post-operative Plan: Extubation in OR  Informed Consent: I have reviewed the patients History and Physical, chart, labs and discussed the procedure including the risks, benefits and alternatives for the proposed anesthesia with the patient or authorized  representative who has indicated his/her understanding and acceptance.     Dental Advisory Given  Plan Discussed with: Anesthesiologist, CRNA and Surgeon  Anesthesia Plan Comments:         Anesthesia Quick Evaluation

## 2021-08-21 NOTE — Discharge Instructions (Signed)
AMBULATORY SURGERY  ?DISCHARGE INSTRUCTIONS ? ? ?The drugs that you were given will stay in your system until tomorrow so for the next 24 hours you should not: ? ?Drive an automobile ?Make any legal decisions ?Drink any alcoholic beverage ? ? ?You may resume regular meals tomorrow.  Today it is better to start with liquids and gradually work up to solid foods. ? ?You may eat anything you prefer, but it is better to start with liquids, then soup and crackers, and gradually work up to solid foods. ? ? ?Please notify your doctor immediately if you have any unusual bleeding, trouble breathing, redness and pain at the surgery site, drainage, fever, or pain not relieved by medication. ? ? ? ?Additional Instructions: ? ? ? ?Please contact your physician with any problems or Same Day Surgery at [phone], Monday through Friday 6 am to 4 pm, or [facility] Main number at [phone].  ?

## 2021-08-21 NOTE — Interval H&P Note (Signed)
History and Physical Interval Note:  08/21/2021 9:42 AM  Tracy Medina  has presented today for surgery, with the diagnosis of Endometriosis pelvic pain menorrhagia.  The various methods of treatment have been discussed with the patient and family. After consideration of risks, benefits and other options for treatment, the patient has consented to  Procedure(s): LAPAROSCOPIC ASSISTED VAGINAL HYSTERECTOMY WITH SALPINGECTOMY (Bilateral) as a surgical intervention.  The patient's history has been reviewed, patient examined, no change in status, stable for surgery.  I have reviewed the patient's chart and labs.  Questions were answered to the patient's satisfaction.     Brennan Bailey

## 2021-08-21 NOTE — OR Nursing (Signed)
Discharge pending void.  Bladder scan 295 discussed with Dr. Logan Bores via tele advises pt MUST void prior to discharge.

## 2021-08-21 NOTE — Op Note (Addendum)
OPERATIVE NOTE 08/21/2021 1:02 PM  PRE-OPERATIVE DIAGNOSIS:  1) Endometriosis 2) pelvic pain 3) menorrhagia  POST-OPERATIVE DIAGNOSIS:  1) Same with: 2) Very small vaginal introitus   OPERATION: Procedure(s) (LRB): TOTAL LAPAROSCOPIC HYSTERECTOMY WITH SALPINGECTOMY (Bilateral)    SURGEON(S): Surgeon(s) and Role:    Linzie Collin, MD - Primary    * Hildred Laser, MD - Assisting  No other capable assistant was available for this surgery which requires an experienced, high level assistant.    ANESTHESIA: Choice  ESTIMATED BLOOD LOSS: 33mL  OPERATIVE FINDINGS: Very small vaginal introitus - normal pelvis  SPECIMEN:  ID Type Source Tests Collected by Time Destination  1 : uterus with cervix, bilateral tubes Tissue PATH Gyn benign resection SURGICAL PATHOLOGY Linzie Collin, MD 08/21/2021 1133     COMPLICATIONS: None  DRAINS: Foley to gravity  DISPOSITION: Stable to recovery room  DESCRIPTION OF PROCEDURE:      The patient was prepped and draped in the dorsolithotomy position and placed under general anesthesia. The bladder was emptied. The vagina was very small in diameter making even insertion of a speculum difficult. We decided to do a TLH noting that it would likely be impossible to work inside the vagina doing the lower pedicles. The cervix was grasped with a multi-toothed tenaculum and a uterine manipulator was placed within the cervical os respecting the position and curvature of the uterus. After changing gloves we proceeded abdominally. A small infraumbilical incision was made and a 5 mm trocar port was placed within the abdominopelvic cavity. The opening pressure was less than 7 mmHg.  Approximately 3 and 1/2 L of carbon dioxide gas was instilled within the abdominal pelvic cavity. The laparoscope was placed and the pelvis and abdomen were carefully inspected. In the usual manner, under direct visualization right and left lower quadrant ports of 5 mm size  were placed. Both ureters were identified in the pelvis prior to dissection or clamping and cutting of pedicles. The fallopian tubes were elevated and the mesenteric side systematically coagulated and divided allowing the tube to be removed at the time of uterine removal. The round ligaments were coagulated and divided and a bladder flap was created. The upper aspect of the broad ligament was clamped coagulated and divided. The uterine arteries were skeletonized, triply coagulated and divided.  The bladder was dissected carefully off of the cervix until the cup could be palpated. Using monopolar scissors, the vaginal cuff was incised against the cup in a circular manner until the cervix was free of the upper vagina.  The specimen was removed vaginally. The vaginal balloon was inflated to maintain abdominal pressure.  After identifying the vaginal cuff, the endo suture was used to systematically close the vaginal cuff in the usual manner. Careful inspection of all pedicles and the remainder of the pelvis was performed. Hemostasis was noted. The lower quadrant ports were removed. A deep suture of 0 vicryl was used to close fascia in the R port incision.  Hemostasis of the port sites was noted, and the incisions were closed in subcuticular manner. The laparoscope and trocar sleeve were removed from the infraumbilical incision, hemostasis was noted, and the incision was closed in a subcuticular manner. A long-acting anesthetic was employed in the skin incisions. A small posterior vaginal tear was noted from the removal of the vaginal cups.  This was closed with 2 sutures of interrupted vicryl.   The patient went to recovery room in stable condition. Clear urine  was noted at the conclusion of the procedure.  Elonda Husky, M.D. 08/21/2021 1:02 PM

## 2021-08-21 NOTE — Transfer of Care (Addendum)
Immediate Anesthesia Transfer of Care Note  [name]  Procedure(s) Performed: LAPAROSCOPIC ASSISTED VAGINAL HYSTERECTOMY WITH SALPINGECTOMY (Bilateral)  Patient Location: PACU  Anesthesia Type:General  Level of Consciousness: drowsy  Airway & Oxygen Therapy: Patient Spontanous Breathing and Patient connected to face mask oxygen  Post-op Assessment: Report given to RN and Post -op Vital signs reviewed and stable  Post vital signs: Reviewed and stable  Last Vitals:  Vitals Value Taken Time  BP    Temp    Pulse    Resp    SpO2      Last Pain:  Vitals:   08/21/21 0919  TempSrc: Temporal  PainSc: 0-No pain         Complications: No notable events documented.

## 2021-08-22 ENCOUNTER — Encounter: Payer: Self-pay | Admitting: Obstetrics and Gynecology

## 2021-08-22 NOTE — Anesthesia Postprocedure Evaluation (Signed)
Anesthesia Post Note  [name]  Procedure(s) Performed: TOTAL LAPAROSCOPIC HYSTERECTOMY WITH SALPINGECTOMY (Bilateral)  Patient location during evaluation: PACU Anesthesia Type: General Level of consciousness: awake and alert Pain management: pain level controlled Vital Signs Assessment: post-procedure vital signs reviewed and stable Respiratory status: spontaneous breathing, nonlabored ventilation, respiratory function stable and patient connected to nasal cannula oxygen Cardiovascular status: blood pressure returned to baseline and stable Postop Assessment: no apparent nausea or vomiting Anesthetic complications: no   No notable events documented.   Last Vitals:  Vitals:   08/21/21 1648 08/21/21 1727  BP: 133/79 123/72  Pulse: 78 86  Resp: 16 18  Temp: 37.1 C 36.5 C  SpO2: 98% 98%    Last Pain:  Vitals:   08/21/21 1727  TempSrc: Temporal  PainSc: 0-No pain                 Yevette Edwards

## 2021-08-23 ENCOUNTER — Emergency Department
Admission: EM | Admit: 2021-08-23 | Discharge: 2021-08-24 | Disposition: A | Discharge status: Home / Self Care | Payer: 59 | Attending: Emergency Medicine | Admitting: Emergency Medicine

## 2021-08-23 ENCOUNTER — Other Ambulatory Visit: Payer: Self-pay | Admitting: Obstetrics and Gynecology

## 2021-08-23 ENCOUNTER — Encounter: Payer: Self-pay | Admitting: Emergency Medicine

## 2021-08-23 ENCOUNTER — Other Ambulatory Visit: Payer: Self-pay

## 2021-08-23 ENCOUNTER — Emergency Department: Payer: 59

## 2021-08-23 DIAGNOSIS — D72829 Elevated white blood cell count, unspecified: Secondary | ICD-10-CM | POA: Diagnosis not present

## 2021-08-23 DIAGNOSIS — G8918 Other acute postprocedural pain: Secondary | ICD-10-CM | POA: Insufficient documentation

## 2021-08-23 DIAGNOSIS — N939 Abnormal uterine and vaginal bleeding, unspecified: Secondary | ICD-10-CM | POA: Insufficient documentation

## 2021-08-23 DIAGNOSIS — R102 Pelvic and perineal pain: Secondary | ICD-10-CM

## 2021-08-23 DIAGNOSIS — K5903 Drug induced constipation: Secondary | ICD-10-CM | POA: Insufficient documentation

## 2021-08-23 DIAGNOSIS — R109 Unspecified abdominal pain: Secondary | ICD-10-CM | POA: Insufficient documentation

## 2021-08-23 LAB — COMPREHENSIVE METABOLIC PANEL
ALT: 16 U/L (ref 0–44)
AST: 19 U/L (ref 15–41)
Albumin: 3.9 g/dL (ref 3.5–5.0)
Alkaline Phosphatase: 86 U/L (ref 38–126)
Anion gap: 7 (ref 5–15)
BUN: 13 mg/dL (ref 6–20)
CO2: 29 mmol/L (ref 22–32)
Calcium: 9.5 mg/dL (ref 8.9–10.3)
Chloride: 103 mmol/L (ref 98–111)
Creatinine, Ser: 0.83 mg/dL (ref 0.44–1.00)
GFR, Estimated: >60 mL/min (ref >60)
Glucose, Bld: 100 mg/dL — ABNORMAL HIGH (ref 70–99)
Potassium: 4.7 mmol/L (ref 3.5–5.1)
Sodium: 139 mmol/L (ref 135–145)
Total Bilirubin: 0.5 mg/dL (ref 0.3–1.2)
Total Protein: 7.2 g/dL (ref 6.5–8.1)

## 2021-08-23 LAB — CBC
HCT: 41.1 % (ref 36.0–46.0)
Hemoglobin: 12.9 g/dL (ref 12.0–15.0)
MCH: 28.5 pg (ref 26.0–34.0)
MCHC: 31.4 g/dL (ref 30.0–36.0)
MCV: 90.7 fL (ref 80.0–100.0)
Platelets: 379 10*3/uL (ref 150–400)
RBC: 4.53 MIL/uL (ref 3.87–5.11)
RDW: 13.4 % (ref 11.5–15.5)
WBC: 11.2 10*3/uL — ABNORMAL HIGH (ref 4.0–10.5)
nRBC: 0 % (ref 0.0–0.2)

## 2021-08-23 LAB — LIPASE, BLOOD: Lipase: 37 U/L (ref 11–51)

## 2021-08-23 LAB — LACTIC ACID, PLASMA: Lactic Acid, Venous: 1.4 mmol/L (ref 0.5–1.9)

## 2021-08-23 LAB — SURGICAL PATHOLOGY

## 2021-08-23 MED ORDER — TRAMADOL HCL 50 MG PO TABS
100.0000 mg | ORAL_TABLET | Freq: Once | ORAL | Status: AC
  Administered 2021-08-23: 100 mg via ORAL
  Filled 2021-08-23: qty 2

## 2021-08-23 MED ORDER — SODIUM CHLORIDE 0.9 % IV BOLUS
500.0000 mL | Freq: Once | INTRAVENOUS | Status: AC
  Administered 2021-08-23: 500 mL via INTRAVENOUS

## 2021-08-23 MED ORDER — METOCLOPRAMIDE HCL 5 MG/ML IJ SOLN
10.0000 mg | Freq: Once | INTRAMUSCULAR | Status: AC
  Administered 2021-08-23: 10 mg via INTRAVENOUS
  Filled 2021-08-23: qty 2

## 2021-08-23 MED ORDER — MORPHINE SULFATE (PF) 2 MG/ML IV SOLN
2.0000 mg | Freq: Once | INTRAVENOUS | Status: AC
  Administered 2021-08-23: 2 mg via INTRAVENOUS
  Filled 2021-08-23: qty 1

## 2021-08-23 MED ORDER — ONDANSETRON 4 MG PO TBDP
4.0000 mg | ORAL_TABLET | Freq: Once | ORAL | Status: AC
Start: 2021-08-23 — End: 2021-08-23
  Administered 2021-08-23: 4 mg via ORAL
  Filled 2021-08-23: qty 1

## 2021-08-23 MED ORDER — OXYCODONE-ACETAMINOPHEN 5-325 MG PO TABS: 1.0000 | ORAL_TABLET | ORAL | 0 refills | Status: DC | PRN

## 2021-08-23 MED ORDER — IOHEXOL 350 MG/ML SOLN
100.0000 mL | Freq: Once | INTRAVENOUS | Status: AC | PRN
  Administered 2021-08-23: 100 mL via INTRAVENOUS

## 2021-08-23 NOTE — ED Triage Notes (Signed)
Pt to ED from home c/o abd cramping, generalized pain, neck pain, and redness over body that started today.  States had hysterectomy on Monday by Dr. Logan Bores.  Was advised to come into ED for possible infection.  States took IBU today, unsure of what time last taken.  Marisue Humble, PA in triage for [facility].

## 2021-08-23 NOTE — Telephone Encounter (Signed)
Pt is calling triage line here at [facility]. Post op pt of [facility]. States her vicodin is not allowing her to rest, it is making her feel anxious and hyper. Requesting another pain medication and also requesting a call back from his CMA. LM with pt informing her I would fwd this to [facility] and Dr. Logan Bores. We do not have a provider in office here today.

## 2021-08-23 NOTE — Telephone Encounter (Signed)
Attempted to call patient, LVM. Advised new medication has been sent in along with why the original was prescribed in the first place. Advised patient to call back.

## 2021-08-23 NOTE — ED Provider Notes (Signed)
[facility] Provider Note    Event Date/Time   First MD Initiated Contact with Patient 08/23/21 2325     (approximate)   History   Post-op Problem   HPI  Tracy Medina is a 31 y.o. female who presents to the ED for evaluation of Post-op Problem   I reviewed 7/31 operative note with Dr. Brennan Bailey, OB/GYN, patient had total laparoscopic hysterectomy and salpingectomy due to chronic pelvic pain, menorrhagia and endometriosis.  No complications noted.   Patient presents to the ED, accompanied by her friend and mother, for the ration of postoperative pain, constipation, gassiness, flushed sensation, chronic shoulder pain.  Patient reports that she cannot take Vicodin because it makes her too hyperactive, so she has been reliant upon tramadol for her pain relief but has had poor control of her pain over the past 2 days.  Reports intermittent bouts of more severe pain, for about an hour yesterday, and again this evening for the past couple hours.  Reports tolerating p.o. intake without complications she denies emesis or fevers.  Reports that she felt a flushed sensation and her friend says that she looked red all over earlier today, this self resolved without any intervention.  She denies any associated pruritus.   She reports a hoarse voice and a sore throat since she woke up from anesthesia.   Reports minimal vaginal bleeding (spotting) without any other novel discharge.  Denies any dysuria, urinary frequency or other urinary changes.  Physical Exam   Triage Vital Signs: ED Triage Vitals  Enc Vitals Group     BP 08/23/21 1913 (!) 147/81     Pulse Rate 08/23/21 1913 83     Resp 08/23/21 1913 16     Temp 08/23/21 1913 100.2 F (37.9 C)     Temp Source 08/23/21 1913 Oral     SpO2 08/23/21 1913 97 %     Weight 08/23/21 1914 260 lb (117.9 kg)     Height 08/23/21 1914 5\' 7"  (1.702 m)     Head Circumference --      Peak Flow --      Pain Score 08/23/21  1914 5     Pain Loc --      Pain Edu? --      Excl. in GC? --     Most recent vital signs: Vitals:   08/24/21 0000 08/24/21 0017  BP: (!) 110/54   Pulse: 72   Resp: 16   Temp:  97.8 F (36.6 C)  SpO2: 99%     General: Awake, no distress.  Obese, well-appearing and conversational.  No appreciable rash. CV:  Good peripheral perfusion.  Resp:  Normal effort.  Abd:  No distention.  Laparoscopic incisions without dehiscence, erythema, induration or purulence.  They look well. Mild and poorly localizing lower abdominal tenderness without peritoneal features.  Upper abdomen is benign.  Appropriate tenderness considering her postoperative state MSK:  No deformity noted.  Neuro:  No focal deficits appreciated. Other:     ED Results / Procedures / Treatments   Labs (all labs ordered are listed, but only abnormal results are displayed) Labs Reviewed  CBC - Abnormal; Notable for the following components:      Result Value   WBC 11.2 (*)    All other components within normal limits  COMPREHENSIVE METABOLIC PANEL - Abnormal; Notable for the following components:   Glucose, Bld 100 (*)    All other components within normal limits  LACTIC ACID,  PLASMA  LIPASE, BLOOD    EKG   RADIOLOGY CT abdomen/pelvis interpreted by me with constipation  Official radiology report(s): CT ABDOMEN PELVIS W CONTRAST  Result Date: 08/23/2021 CLINICAL DATA:  Acute abdominal pain.  Hysterectomy 3 days ago. EXAM: CT ABDOMEN AND PELVIS WITH CONTRAST TECHNIQUE: Multidetector CT imaging of the abdomen and pelvis was performed using the standard protocol following bolus administration of intravenous contrast. RADIATION DOSE REDUCTION: This exam was performed according to the departmental dose-optimization program which includes automated exposure control, adjustment of the mA and/or kV according to patient size and/or use of iterative reconstruction technique. CONTRAST:  OMNIPAQUE IOHEXOL 350 MG/ML SOLN  COMPARISON:  CT 06/24/2021 FINDINGS: Lower chest: No acute basilar airspace disease or pleural effusion. Hepatobiliary: Cholecystectomy without biliary dilatation. No focal hepatic abnormalities. Pancreas: No ductal dilatation or inflammation. Spleen: Normal in size without focal abnormality. Adrenals/Urinary Tract: No adrenal nodule. No hydronephrosis or perinephric edema. Homogeneous renal enhancement with symmetric excretion on delayed phase imaging. No renal calculi or focal lesion. Urinary bladder is physiologically distended without wall thickening. No intravesicular air. Stomach/Bowel: Stomach is partially distended. Few fluid-filled loops of small bowel in the right pelvis and central abdomen, without obstruction, bowel wall thickening or inflammatory change. Normal appendix visualized. Moderate to large volume of stool throughout the entire colon. There is mild sigmoid colonic redundancy. No colonic wall thickening or pericolonic edema. No evidence of bowel injury. Small amount of stool in the rectum. Vascular/Lymphatic: Normal caliber abdominal aorta. Patent portal, splenic, and mesenteric veins. No evidence of ovarian vein thrombus. No adenopathy. Reproductive: Recent hysterectomy. Only minimal stranding and trace free fluid within the operative bed. No focal fluid collection. Scattered small foci of free air within the pelvis are not unexpected post recent surgery. There is no adnexal mass. Other: Small amount of soft tissue gas in the pelvis is not unexpected post recent hysterectomy. No evidence of focal fluid collection or abscess. Stranding in the region of the operative bed with trace free fluid. No organized fluid collection. Linear edema in the subcutaneous tissues of the anterior abdominal wall at site of prior laparoscopic port sites. There is no subcutaneous collection. No subcutaneous gas. 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 There are no acute or suspicious osseous abnormalities. IMPRESSION: 1. Recent hysterectomy with expected postsurgical change. No evidence of abscess or postoperative complication. 2. Moderate to large volume of stool throughout the entire colon, can be seen with constipation. 3. 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 Electronically Signed   By: Narda Rutherford M.D.   On: 08/23/2021 21:12    PROCEDURES and INTERVENTIONS:  Procedures  Medications  ondansetron (ZOFRAN-ODT) disintegrating tablet 4 mg (4 mg Oral Given 08/23/21 1925)  traMADol (ULTRAM) tablet 100 mg (100 mg Oral Given 08/23/21 1925)  metoCLOPramide (REGLAN) injection 10 mg (10 mg Intravenous Given 08/23/21 2050)  morphine (PF) 2 MG/ML injection 2 mg (2 mg Intravenous Given 08/23/21 2050)  sodium chloride 0.9 % bolus 500 mL (0 mLs Intravenous Stopped 08/23/21 2335)  iohexol (OMNIPAQUE) 350 MG/ML injection 100 mL (100 mLs Intravenous Contrast Given 08/23/21 2054)  lactated ringers bolus 1,000 mL (1,000 mLs Intravenous New Bag/Given 08/24/21 0020)  traMADol (ULTRAM) tablet 100 mg (100 mg Oral Given 08/24/21 0020)  ketorolac (TORADOL) 30 MG/ML injection 15 mg (15 mg Intravenous Given 08/24/21 0020)  acetaminophen (TYLENOL) tablet 1,000 mg (1,000 mg Oral Given 08/24/21 0020)  IMPRESSION / MDM / ASSESSMENT AND PLAN / ED COURSE  I reviewed the triage vital signs and the nursing notes.  Differential diagnosis includes, but is not limited to, postoperative pain, constipation, SBO, wound dehiscence, sepsis, allergic reaction or anaphylaxis  {Patient presents with symptoms of an acute illness or injury that is potentially life-threatening.  31 year old female presents with various postoperative symptoms after recent hysterectomy, without evidence of severe acute pathology and ultimately suitable for outpatient management.  Looks systemically well.  Some mild lower abdominal  tenderness without peritoneal features, and appropriate exam in the setting of her recent intervention.  Has a slightly hoarse voice, but no evidence of upper airway obstruction, and this is likely fairly appropriate in the setting of her anesthesia for her hysterectomy.  Blood work is reassuring, she has minimal leukocytosis to 11, but no lactic acidosis or significant metabolic derangements.  Normal lipase.  CT abdomen/pelvis, as above, with constipation and appropriate postoperative changes.  I consult with OB/GYN who does not recommend any further diagnostics or medical admission.  Discussed management at home with the patient, return precautions and she is suitable for outpatient management.  Clinical Course as of 08/24/21 0106  Thu Aug 24, 2021  0015 I consult with Tresea Mall, CNM covering Consuella Lose, we discussed patient's recent intervention, her presentation tonight and work-up so far with blood work and CT imaging.  She is agreeable with pursuing multimodal analgesia, MiraLAX at home and outpatient management close follow-up nothing further to add [DS]  0104 Reassessed.  Patient reports feeling better.  We discussed at length management at home with multimodal analgesia, MiraLAX for constipation and return precautions for the ED.  Answered questions. [DS]    Clinical Course User Index [DS] Delton Prairie, MD     FINAL CLINICAL IMPRESSION(S) / ED DIAGNOSES   Final diagnoses:  Post-operative pain  Drug-induced constipation     Rx / DC Orders   ED Discharge Orders          Ordered    diclofenac (VOLTAREN) 50 MG EC tablet  2 times daily        08/24/21 0103    traMADol (ULTRAM) 50 MG tablet  Every 6 hours PRN        08/24/21 0103             Note:  This document was prepared using Dragon voice recognition software and may include unintentional dictation errors.   Delton Prairie, MD 08/24/21 [phone]

## 2021-08-23 NOTE — ED Notes (Signed)
ED Provider at bedside. 

## 2021-08-23 NOTE — ED Provider Triage Note (Signed)
Emergency Medicine Provider Triage Evaluation Note  Chyrel Taha, a 31 y.o. female  was evaluated in triage.  Pt complains of fever, generalized myalgias, and abdominal discomfort.  Patient is 2 days status post laparoscopic hysterectomy with salpingectomy.  She called Dr. Logan Bores office who suggested he come to the ED for further evaluation of possible postop infection.  Review of Systems  Positive: FCS, abd pain Negative: Vag bleeding  Physical Exam  BP (!) 147/81 (BP Location: Left Arm)   Pulse 83   Temp 100.2 F (37.9 C) (Oral)   Resp 16   Ht 5\' 7"  (1.702 m)   Wt 117.9 kg   LMP 07/23/2021 (Approximate)   SpO2 97%   BMI 40.72 kg/m  Gen:   Awake, no distress  AND Resp:  Normal effort CTA MSK:   Moves extremities without difficulty  Other:    Medical Decision Making  Medically screening exam initiated at 7:17 PM.  Appropriate orders placed.  Gabriela Irigoyen was informed that the remainder of the evaluation will be completed by another provider, this initial triage assessment does not replace that evaluation, and the importance of remaining in the ED until their evaluation is complete.  Patient to the ED for postop fever 2 days status post laparoscopic abdominal hysterectomy.   Ephriam Jenkins, PA-C 08/23/21 1918

## 2021-08-24 MED ORDER — TRAMADOL HCL 50 MG PO TABS
100.0000 mg | ORAL_TABLET | Freq: Once | ORAL | Status: AC
  Administered 2021-08-24: 100 mg via ORAL
  Filled 2021-08-24: qty 2

## 2021-08-24 MED ORDER — KETOROLAC TROMETHAMINE 30 MG/ML IJ SOLN
15.0000 mg | Freq: Once | INTRAMUSCULAR | Status: AC
  Administered 2021-08-24: 15 mg via INTRAVENOUS
  Filled 2021-08-24: qty 1

## 2021-08-24 MED ORDER — LACTATED RINGERS IV BOLUS
1000.0000 mL | Freq: Once | INTRAVENOUS | Status: AC
  Administered 2021-08-24: 1000 mL via INTRAVENOUS

## 2021-08-24 MED ORDER — DICLOFENAC SODIUM 50 MG PO TBEC: 50.0000 mg | DELAYED_RELEASE_TABLET | Freq: Two times a day (BID) | ORAL | 0 refills | Status: DC

## 2021-08-24 MED ORDER — TRAMADOL HCL 50 MG PO TABS: 100.0000 mg | ORAL_TABLET | Freq: Four times a day (QID) | ORAL | 0 refills | Status: DC | PRN

## 2021-08-24 MED ORDER — ACETAMINOPHEN 500 MG PO TABS
1000.0000 mg | ORAL_TABLET | Freq: Once | ORAL | Status: AC
  Administered 2021-08-24: 1000 mg via ORAL
  Filled 2021-08-24: qty 2

## 2021-08-24 NOTE — Telephone Encounter (Signed)
Attempted to reach patient, no answer. MyChart message sent yesterday has been read by patient, no reply.

## 2021-08-24 NOTE — Discharge Instructions (Addendum)
Use Tylenol for pain and fevers.  Up to 1000 mg per dose, up to 4 times per day.  Do not take more than 4000 mg of Tylenol/acetaminophen within 24 hours..  Take the diclofenac medication up to 2 times per day, this is 50 mg per dose.  Do not mix other NSAIDs with this medication, such as Aleve, ibuprofen, phoxim, BC Goody's.  Take the tramadol for more severe pain.  Take 100 mg per dose (2 tablets) and take 2-3 times per day as needed for more severe pain.  This medication may make you sleepy.  Do not drive or operate machinery.  Use MiraLAX for your constipation.  Start with 2 capfuls per dose, up to 2 times per day.  Once you start passing stool then you can cut back to 1 capful per dose, 1-2 times per day.  Follow-up with Dr. Logan Bores.  Any more severe/worsening pain despite this regimen, fevers or other worsening symptoms then please return to the ED.

## 2021-08-24 NOTE — ED Notes (Signed)
Patient discharged at this time. Ambulated to lobby with independent and steady gait. Breathing unlabored speaking in full sentences. Verbalized understanding of all discharge, follow up, and medication teaching. Discharged homed with all belongings.   

## 2021-09-01 ENCOUNTER — Encounter: Payer: 59 | Admitting: Obstetrics and Gynecology

## 2021-09-05 ENCOUNTER — Ambulatory Visit (INDEPENDENT_AMBULATORY_CARE_PROVIDER_SITE_OTHER): Payer: 59 | Admitting: Obstetrics & Gynecology

## 2021-09-05 ENCOUNTER — Ambulatory Visit
Admission: RE | Admit: 2021-09-05 | Discharge: 2021-09-05 | Disposition: A | Discharge status: Home / Self Care | Payer: 59 | Attending: Obstetrics & Gynecology | Admitting: Obstetrics & Gynecology

## 2021-09-05 ENCOUNTER — Ambulatory Visit
Admission: RE | Admit: 2021-09-05 | Discharge: 2021-09-05 | Disposition: A | Discharge status: Home / Self Care | Payer: 59 | Source: Ambulatory Visit | Attending: Obstetrics & Gynecology | Admitting: Obstetrics & Gynecology

## 2021-09-05 VITALS — BP 122/80 | Wt 271.0 lb

## 2021-09-05 DIAGNOSIS — M79622 Pain in left upper arm: Secondary | ICD-10-CM

## 2021-09-07 ENCOUNTER — Encounter: Payer: Self-pay | Admitting: Obstetrics and Gynecology

## 2021-09-13 ENCOUNTER — Telehealth: Payer: Self-pay | Admitting: Obstetrics and Gynecology

## 2021-09-13 NOTE — Telephone Encounter (Signed)
Contacted pt to schedule f/u appt with Dr. Logan Bores on 8/25 at either 8:00 or 8:30.  Left message for pt to call back to schedule appt.

## 2021-09-14 NOTE — Telephone Encounter (Signed)
Pt is scheduled with Dr. Logan Bores at 8:30.

## 2021-09-15 ENCOUNTER — Encounter: Payer: Self-pay | Admitting: Obstetrics and Gynecology

## 2021-09-15 ENCOUNTER — Ambulatory Visit (INDEPENDENT_AMBULATORY_CARE_PROVIDER_SITE_OTHER): Payer: Self-pay | Admitting: Obstetrics and Gynecology

## 2021-09-15 VITALS — BP 126/76 | Ht 67.0 in | Wt 269.4 lb

## 2021-09-15 DIAGNOSIS — Z9889 Other specified postprocedural states: Secondary | ICD-10-CM

## 2021-09-15 NOTE — Progress Notes (Signed)
HPI:      Tracy Medina is a 31 y.o. G0P0000 who LMP was Patient's last menstrual period was 07/23/2021 (approximate).  Subjective:   She presents today approximately 4 weeks postop from [facility].  She reports she is doing well.  Not having any pain.  Denies vaginal bleeding.  Says that she sometimes feels when she overexerts herself but otherwise she has no issues. Her Nexplanon is still in place.  Previous provider had difficulty locating it and sent her for x-ray.    Hx: The following portions of the patient's history were reviewed and updated as appropriate:             She  has a past medical history of Anxiety, Arthritis, Asthma, Depression, and Endometriosis. She does not have any pertinent problems on file. She  has a past surgical history that includes laparoscopy; Cholecystectomy; Tonsillectomy and adenoidectomy; Tympanostomy tube placement; Total laparoscopic hysterectomy with salpingectomy (Bilateral, 08/21/2021); and Abdominal hysterectomy. Her family history includes Hypertension in her mother. She was adopted. She  reports that she has never smoked. She has never used smokeless tobacco. She reports current alcohol use of about 2.0 standard drinks of alcohol per week. She reports that she does not use drugs. She has a current medication list which includes the following prescription(s): albuterol, cyclobenzaprine, diclofenac, fluoxetine, ibuprofen, levalbuterol, trazodone, nexplanon, and flovent hfa. She is allergic to cefaclor.       Review of Systems:  Review of Systems  Constitutional: Denied constitutional symptoms, night sweats, recent illness, fatigue, fever, insomnia and weight loss.  Eyes: Denied eye symptoms, eye pain, photophobia, vision change and visual disturbance.  Ears/Nose/Throat/Neck: Denied ear, nose, throat or neck symptoms, hearing loss, nasal discharge, sinus congestion and sore throat.  Cardiovascular: Denied cardiovascular symptoms, arrhythmia, chest  pain/pressure, edema, exercise intolerance, orthopnea and palpitations.  Respiratory: Denied pulmonary symptoms, asthma, pleuritic pain, productive sputum, cough, dyspnea and wheezing.  Gastrointestinal: Denied, gastro-esophageal reflux, melena, nausea and vomiting.  Genitourinary: Denied genitourinary symptoms including symptomatic vaginal discharge, pelvic relaxation issues, and urinary complaints.  Musculoskeletal: Denied musculoskeletal symptoms, stiffness, swelling, muscle weakness and myalgia.  Dermatologic: Denied dermatology symptoms, rash and scar.  Neurologic: Denied neurology symptoms, dizziness, headache, neck pain and syncope.  Psychiatric: Denied psychiatric symptoms, anxiety and depression.  Endocrine: Denied endocrine symptoms including hot flashes and night sweats.   Meds:   Current Outpatient Medications on File Prior to Visit  Medication Sig Dispense Refill   albuterol (VENTOLIN HFA) 108 (90 Base) MCG/ACT inhaler TAKE 2 PUFFS BY MOUTH EVERY 6 HOURS AS NEEDED FOR WHEEZE OR SHORTNESS OF BREATH 8.5 each 0   cyclobenzaprine (FLEXERIL) 10 MG tablet Take 1 tablet (10 mg total) by mouth 2 (two) times daily as needed for muscle spasms. 20 tablet 0   diclofenac (VOLTAREN) 50 MG EC tablet Take 1 tablet (50 mg total) by mouth 2 (two) times daily. 30 tablet 0   FLUoxetine (PROZAC) 40 MG capsule Take 1 capsule (40 mg total) by mouth daily. 90 capsule 2   ibuprofen (ADVIL) 800 MG tablet Take 1 tablet (800 mg total) by mouth every 8 (eight) hours as needed. 30 tablet 0   levalbuterol (XOPENEX) 0.31 MG/3ML nebulizer solution TAKE 3 MLS (0.31 MG TOTAL) BY NEBULIZATION EVERY 4 (FOUR) HOURS AS NEEDED FOR WHEEZING. 90 mL 1   traZODone (DESYREL) 50 MG tablet Take 0.5-1 tablets (25-50 mg total) by mouth at bedtime as needed for sleep. 30 tablet 0   etonogestrel (NEXPLANON) 68 MG IMPL implant  1 each by Subdermal route once.     FLOVENT HFA 44 MCG/ACT inhaler INHALE 2 PUFFS INTO THE LUNGS TWICE A  DAY 10.6 each 1   No current facility-administered medications on file prior to visit.      Objective:     Vitals:   09/15/21 0835  BP: 126/76   Filed Weights   09/15/21 0835  Weight: 269 lb 6.4 oz (122.2 kg)               Abdomen: Soft.  Non-tender.  No masses.  No HSM.  Incision/s: Intact.  Healing well.  No erythema.  No drainage.             Assessment:    G0P0000 Patient Active Problem List   Diagnosis Date Noted   Morbid obesity (HCC) 05/30/2021   Insomnia 05/24/2020   Elevated blood pressure reading in office without diagnosis of hypertension 05/24/2020   Endometriosis 02/29/2020   Iron deficiency 02/29/2020   Anxiety and depression 02/29/2020   Asthma 02/29/2020   OA (osteoarthritis of the spine) 02/29/2020     1. Postoperative state     Patient with excellent recovery.   Plan:            1.  Patient may slowly increase activities with exception of heavy lifting and nothing in the vagina. 2.  Return for Nexplanon removal at her second postop visit. Orders No orders of the defined types were placed in this encounter.   No orders of the defined types were placed in this encounter.     F/U  Return in about 4 weeks (around 10/13/2021).  Elonda Husky, M.D. 09/15/2021 9:11 AM

## 2021-09-15 NOTE — Progress Notes (Signed)
Patient presents for 4 week postop follow-up following hysterectomy. She states mild pain after over exerting herself but other than that no issues or concerns.

## 2021-09-21 ENCOUNTER — Encounter: Payer: Self-pay | Admitting: Internal Medicine

## 2021-09-26 ENCOUNTER — Ambulatory Visit (INDEPENDENT_AMBULATORY_CARE_PROVIDER_SITE_OTHER): Payer: Self-pay | Admitting: Obstetrics

## 2021-09-26 ENCOUNTER — Ambulatory Visit: Payer: 59 | Admitting: Obstetrics

## 2021-09-26 VITALS — BP 126/84 | Wt 272.0 lb

## 2021-09-26 DIAGNOSIS — Z3046 Encounter for surveillance of implantable subdermal contraceptive: Secondary | ICD-10-CM | POA: Diagnosis not present

## 2021-09-26 NOTE — Progress Notes (Signed)
      Tracy Medina presents today for the removal of her Nexplanon. She recently underwent a TLH, which was performed by Dr. Logan Bores. She reports that she is feeling better and has no post -op concerns. She has felt more emotional lately, and is eager for the implant to be removed, so that her hormones will "regulate".  Her mother is present with her and very supportive. It has been difficult to palpate the implant, and she had an x-ray to confirm its position.   O: BP 126/84   Wt 272 lb (123.4 kg)   LMP 07/23/2021 (Approximate)   BMI 42.60 kg/m  Review of Systems  Constitutional: Negative.   HENT: Negative.    Eyes: Negative.   Respiratory: Negative.    Cardiovascular: Negative.   Gastrointestinal: Negative.   Genitourinary: Negative.   Musculoskeletal: Negative.   Skin: Negative.   All other systems reviewed and are negative.   Physical Exam Vitals reviewed.  Constitutional:      Appearance: Normal appearance. She is obese.  Cardiovascular:     Rate and Rhythm: Normal rate and regular rhythm.  Pulmonary:     Effort: Pulmonary effort is normal.     Breath sounds: Normal breath sounds.  Abdominal:     Palpations: Abdomen is soft.  Musculoskeletal:     Cervical back: Normal range of motion and neck supple.  Neurological:     Mental Status: She is alert.  Psychiatric:     Comments: Admits to feeling anxious. Hx of anxiety surrounding gyn physicals    A: Nexplanon removal Post op following TLH.  GYNECOLOGY PROCEDURE NOTE  Implanon removal discussed in detail.  Risks of infection, bleeding, nerve injury all reviewed.  Patient understands risks and desires to proceed.  Verbal consent obtained.  Patient is certain she wants the implanon removed.  All questions answered.  Procedure: Patient placed in dorsal supine with left arm above head, elbow flexed at 90 degrees, arm resting on examination table.  Implanon identified without problems.  Betadine scrub x3.  1 ml of 1% lidocaine  injected under implanon device without problems.  Sterile gloves applied.  Small 0.5cm incision made at distal tip of implanon device with 11 blade scalpel.  Implanon brought to incision and grasped with a small kelly clamp.  Implanon removed intact without problems.  Pressure applied to incision.  Hemostasis obtained.  Steri-strips applied, followed by bandage and compression dressing.  Patient tolerated procedure well.  No complications.   Assessment: 31 y.o. year old female now s/p uncomplicated implanon removal.  Plan: 1.  Patient given post procedure precautions and asked to call for fever, chills, redness or drainage from her incision, bleeding from incision.  She understands she will likely have a small bruise near site of removal and can remove bandage tomorrow and steri-strips in approximately 1 week.  2) Contraception- NA (hysterectomy)  She has plans for follow up with Dr. Logan Bores  for the [facility].  Mirna Mires, CNM  09/26/2021 4:47 PM    J2001 for lidocaine block, 11982 for nexplanon removal

## 2021-09-29 ENCOUNTER — Encounter: Payer: Self-pay | Admitting: Internal Medicine

## 2021-09-29 ENCOUNTER — Ambulatory Visit: Payer: 59 | Admitting: Internal Medicine

## 2021-09-29 VITALS — BP 132/70 | HR 75 | Ht 67.0 in | Wt 273.2 lb

## 2021-09-29 DIAGNOSIS — F5104 Psychophysiologic insomnia: Secondary | ICD-10-CM | POA: Diagnosis not present

## 2021-09-29 DIAGNOSIS — Z23 Encounter for immunization: Secondary | ICD-10-CM

## 2021-09-29 DIAGNOSIS — F32A Depression, unspecified: Secondary | ICD-10-CM | POA: Diagnosis not present

## 2021-09-29 DIAGNOSIS — F419 Anxiety disorder, unspecified: Secondary | ICD-10-CM

## 2021-09-29 MED ORDER — BUPROPION HCL ER (XL) 150 MG PO TB24: 150.0000 mg | ORAL_TABLET | Freq: Every day | ORAL | 1 refills | Status: DC

## 2021-09-29 MED ORDER — ESZOPICLONE 2 MG PO TABS: 2.0000 mg | ORAL_TABLET | Freq: Every evening | ORAL | 0 refills | Status: AC | PRN

## 2021-09-29 NOTE — Assessment & Plan Note (Addendum)
Deteriorated, likely multifactorial and situational Continue fluoxetine 40 mg daily We will add wellbutrin 150 mg XL daily She will look for a therapist but has constraints right now due to lack of insurance We will check TSH, estrogens, progesterone and testosterone today  Support offered

## 2021-09-29 NOTE — Patient Instructions (Signed)

## 2021-09-29 NOTE — Addendum Note (Signed)
Addended by: Burnell Blanks on: 09/29/2021 11:11 AM   Modules accepted: Orders

## 2021-09-29 NOTE — Progress Notes (Signed)
Subjective:    Patient ID: Tracy Medina, female    DOB: [birth year], 31 y.o.   MRN: [id number]  HPI  Patient presents to clinic today for follow-up of anxiety and depression. This is a chronic issue but she feels like this has been worse in the last 2 weeks since her partial hysterectomy. They did leave her ovaries.  She reports she also recently left her job prior to her hysterectomy.  She is living alone and does have a few dogs that seem to help her anxiety although they are not listed as emotional support animals.  She is not currently seeing a therapist.  This is currently managed on Fluoxetine 40 mg daily and Trazodone for sleep but she does not feel like these medications are as effective as they used to.  She denies SI/HI.  Review of Systems  Past Medical History:  Diagnosis Date   Anxiety    Arthritis    Asthma    Depression    Endometriosis     Current Outpatient Medications  Medication Sig Dispense Refill   albuterol (VENTOLIN HFA) 108 (90 Base) MCG/ACT inhaler TAKE 2 PUFFS BY MOUTH EVERY 6 HOURS AS NEEDED FOR WHEEZE OR SHORTNESS OF BREATH 8.5 each 0   cyclobenzaprine (FLEXERIL) 10 MG tablet Take 1 tablet (10 mg total) by mouth 2 (two) times daily as needed for muscle spasms. 20 tablet 0   FLOVENT HFA 44 MCG/ACT inhaler INHALE 2 PUFFS INTO THE LUNGS TWICE A DAY 10.6 each 1   FLUoxetine (PROZAC) 40 MG capsule Take 1 capsule (40 mg total) by mouth daily. 90 capsule 2   levalbuterol (XOPENEX) 0.31 MG/3ML nebulizer solution TAKE 3 MLS (0.31 MG TOTAL) BY NEBULIZATION EVERY 4 (FOUR) HOURS AS NEEDED FOR WHEEZING. 90 mL 1   traZODone (DESYREL) 50 MG tablet Take 0.5-1 tablets (25-50 mg total) by mouth at bedtime as needed for sleep. 30 tablet 0   etonogestrel (NEXPLANON) 68 MG IMPL implant 1 each by Subdermal route once.     No current facility-administered medications for this visit.    Allergies  Allergen Reactions   Cefaclor Hives and Rash    Family History  Adopted:  Yes  Problem Relation Age of Onset   Hypertension Mother     Social History   Socioeconomic History   Marital status: Single    Spouse name: Not on file   Number of children: Not on file   Years of education: Not on file   Highest education level: Not on file  Occupational History   Not on file  Tobacco Use   Smoking status: Never   Smokeless tobacco: Never  Vaping Use   Vaping Use: Never used  Substance and Sexual Activity   Alcohol use: Yes    Alcohol/week: 2.0 standard drinks of alcohol    Types: 1 Cans of beer, 1 Shots of liquor per week    Comment: social   Drug use: Never   Sexual activity: Not Currently    Birth control/protection: Surgical    Comment: hysterectomy  Other Topics Concern   Not on file  Social History Narrative   Not on file   Social Determinants of Health   Financial Resource Strain: Not on file  Food Insecurity: Not on file  Transportation Needs: Not on file  Physical Activity: Not on file  Stress: Not on file  Social Connections: Not on file  Intimate Partner Violence: Not on file     Constitutional: Denies fever, malaise, fatigue,  headache or abrupt weight changes.  Respiratory: Denies difficulty breathing, shortness of breath, cough or sputum production.   Cardiovascular: Denies chest pain, chest tightness, palpitations or swelling in the hands or feet.  Neurological: Patient with insomnia.  Denies dizziness, difficulty with memory, difficulty with speech or problems with balance and coordination.  Psych: Patient reports anxiety and depression.  Denies SI/HI.  No other specific complaints in a complete review of systems (except as listed in HPI above).     Objective:   Physical Exam   Pulse 75   Ht 5\' 7"  (1.702 m)   Wt 273 lb 3.2 oz (123.9 kg)   LMP 07/23/2021 (Approximate)   SpO2 97%   BMI 42.79 kg/m  Wt Readings from Last 3 Encounters:  09/29/21 273 lb 3.2 oz (123.9 kg)  09/26/21 272 lb (123.4 kg)  09/15/21 269 lb 6.4  oz (122.2 kg)    General: Appears her stated age, obese, in NAD. Cardiovascular: Normal rate. Pulmonary/Chest: Normal effort. Neurological: Alert and oriented.  Psychiatric: Mood and affect normal. Mildly anxious appearing. Judgment and thought content normal.    BMET    Component Value Date/Time   NA 139 08/23/2021 1914   K 4.7 08/23/2021 1914   CL 103 08/23/2021 1914   CO2 29 08/23/2021 1914   GLUCOSE 100 (H) 08/23/2021 1914   BUN 13 08/23/2021 1914   CREATININE 0.83 08/23/2021 1914   CREATININE 0.84 05/30/2021 0858   CALCIUM 9.5 08/23/2021 1914   GFRNONAA >60 08/23/2021 1914    Lipid Panel     Component Value Date/Time   CHOL 153 05/30/2021 0858   TRIG 74 05/30/2021 0858   HDL 57 05/30/2021 0858   CHOLHDL 2.7 05/30/2021 0858   LDLCALC 81 05/30/2021 0858    CBC    Component Value Date/Time   WBC 11.2 (H) 08/23/2021 1914   RBC 4.53 08/23/2021 1914   HGB 12.9 08/23/2021 1914   HCT 41.1 08/23/2021 1914   PLT 379 08/23/2021 1914   MCV 90.7 08/23/2021 1914   MCH 28.5 08/23/2021 1914   MCHC 31.4 08/23/2021 1914   RDW 13.4 08/23/2021 1914   LYMPHSABS 2.4 06/10/2021 1933   MONOABS 0.7 06/10/2021 1933   EOSABS 0.2 06/10/2021 1933   BASOSABS 0.1 06/10/2021 1933    Hgb A1C Lab Results  Component Value Date   HGBA1C 5.1 05/30/2021           Assessment & Plan:   RTC in 2 months for follow-up of chronic conditions 07/30/2021, NP

## 2021-09-29 NOTE — Assessment & Plan Note (Signed)
We will D/C trazodone We will trial Lunesta 2 mg daily

## 2021-10-04 LAB — PROGESTERONE: Progesterone: <0.5 ng/mL

## 2021-10-04 LAB — ESTROGENS, TOTAL: Estrogen: 248 pg/mL

## 2021-10-04 LAB — TESTOSTERONE, TOTAL, LC/MS/MS: Testosterone, Total, LC-MS-MS: 27 ng/dL (ref 2–45)

## 2021-10-04 LAB — TSH: TSH: 3.54 mIU/L

## 2021-10-12 ENCOUNTER — Encounter: Payer: Self-pay | Admitting: Obstetrics and Gynecology

## 2021-10-12 DIAGNOSIS — Z9889 Other specified postprocedural states: Secondary | ICD-10-CM

## 2021-10-23 ENCOUNTER — Encounter: Payer: Self-pay | Admitting: Obstetrics & Gynecology

## 2021-10-23 NOTE — Progress Notes (Signed)
IUD complication DG Humerus left. Xray results  Unable to find Nexplanon for removal from left arm Pt. Not preg.  RTN as scheduled  Rosario Adie, MD  10/23/2021 3:52 AM

## 2021-12-03 ENCOUNTER — Ambulatory Visit
Admission: RE | Admit: 2021-12-03 | Discharge: 2021-12-03 | Disposition: A | Discharge status: Home / Self Care | Payer: 59 | Source: Ambulatory Visit | Attending: Urgent Care | Admitting: Urgent Care

## 2021-12-03 VITALS — BP 135/91 | HR 93 | Temp 97.9°F | Resp 16

## 2021-12-03 DIAGNOSIS — J4541 Moderate persistent asthma with (acute) exacerbation: Secondary | ICD-10-CM

## 2021-12-03 MED ORDER — DEXAMETHASONE SODIUM PHOSPHATE 10 MG/ML IJ SOLN
10.0000 mg | Freq: Once | INTRAMUSCULAR | Status: AC
  Administered 2021-12-03: 10 mg via INTRAMUSCULAR

## 2021-12-03 MED ORDER — PREDNISONE 50 MG PO TABS: 50.0000 mg | ORAL_TABLET | Freq: Every day | ORAL | 0 refills | Status: AC

## 2021-12-03 MED ORDER — BENZONATATE 100 MG PO CAPS: ORAL_CAPSULE | ORAL | 0 refills | Status: DC

## 2021-12-03 NOTE — ED Provider Notes (Signed)
[facility]    CSN: [id number] Arrival date & time: 12/03/21  1057      History   Chief Complaint Chief Complaint  Patient presents with   Cough    Bad cough mixed in with asthma making it hard to breathe usually am prescribed prednisone this time of year I always get sick. - Entered by patient   Wheezing    HPI Tracy Medina is a 31 y.o. female.    Cough Associated symptoms: wheezing   Wheezing Associated symptoms: cough     Presents to UC with complaint of acute exacerbation of asthma x1 week.  Patient reports wheezing and cough worsened by deep breathing.  She endorses shortness of breath.  Patient has history of asthma and currently treating herself with albuterol via nebulizer along with Tessalon Perles.  She says these treatments have not been effective at calming her symptoms.  Chart indicates Flovent inhaler as well.  Past Medical History:  Diagnosis Date   Anxiety    Arthritis    Asthma    Depression    Endometriosis     Patient Active Problem List   Diagnosis Date Noted   Morbid obesity (HCC) 05/30/2021   Insomnia 05/24/2020   Elevated blood pressure reading in office without diagnosis of hypertension 05/24/2020   Endometriosis 02/29/2020   Iron deficiency 02/29/2020   Anxiety and depression 02/29/2020   Asthma 02/29/2020   OA (osteoarthritis of the spine) 02/29/2020    Past Surgical History:  Procedure Laterality Date   ABDOMINAL HYSTERECTOMY     CHOLECYSTECTOMY     LAPAROSCOPY     TONSILLECTOMY AND ADENOIDECTOMY     TOTAL LAPAROSCOPIC HYSTERECTOMY WITH SALPINGECTOMY Bilateral 08/21/2021   Procedure: TOTAL LAPAROSCOPIC HYSTERECTOMY WITH SALPINGECTOMY;  Surgeon: Linzie Collin, MD;  Location: [facility] ORS;  Service: Gynecology;  Laterality: Bilateral;   TYMPANOSTOMY TUBE PLACEMENT      OB History     Gravida  0   Para  0   Term  0   Preterm  0   AB  0   Living  0      SAB  0   IAB  0   Ectopic  0   Multiple   0   Live Births  0            Home Medications    Prior to Admission medications   Medication Sig Start Date End Date Taking? Authorizing Provider  albuterol (VENTOLIN HFA) 108 (90 Base) MCG/ACT inhaler TAKE 2 PUFFS BY MOUTH EVERY 6 HOURS AS NEEDED FOR WHEEZE OR SHORTNESS OF BREATH 11/25/20   Lorre Munroe, NP  buPROPion (WELLBUTRIN XL) 150 MG 24 hr tablet Take 1 tablet (150 mg total) by mouth daily. 09/29/21   Lorre Munroe, NP  cyclobenzaprine (FLEXERIL) 10 MG tablet Take 1 tablet (10 mg total) by mouth 2 (two) times daily as needed for muscle spasms. 06/10/21   Mickie Bail, NP  eszopiclone (LUNESTA) 2 MG TABS tablet Take 1 tablet (2 mg total) by mouth at bedtime as needed for sleep. Take immediately before bedtime 09/29/21   Lorre Munroe, NP  etonogestrel (NEXPLANON) 68 MG IMPL implant 1 each by Subdermal route once. 04/08/20 08/14/21  [provider]  FLOVENT HFA 44 MCG/ACT inhaler INHALE 2 PUFFS INTO THE LUNGS TWICE A DAY 11/30/20   Lorre Munroe, NP  FLUoxetine (PROZAC) 40 MG capsule Take 1 capsule (40 mg total) by mouth daily. 12/22/20   Baity,  Salvadore Oxford, NP  levalbuterol (XOPENEX) 0.31 MG/3ML nebulizer solution TAKE 3 MLS (0.31 MG TOTAL) BY NEBULIZATION EVERY 4 (FOUR) HOURS AS NEEDED FOR WHEEZING. 05/08/21   Baity, Salvadore Oxford, NP    Family History Family History  Adopted: Yes  Problem Relation Age of Onset   Hypertension Mother     Social History Social History   Tobacco Use   Smoking status: Never   Smokeless tobacco: Never  Vaping Use   Vaping Use: Never used  Substance Use Topics   Alcohol use: Yes    Alcohol/week: 2.0 standard drinks of alcohol    Types: 1 Cans of beer, 1 Shots of liquor per week    Comment: social   Drug use: Never     Allergies   Cefaclor   Review of Systems Review of Systems  Respiratory:  Positive for cough and wheezing.      Physical Exam Triage Vital Signs ED Triage Vitals [12/03/21 1120]  Enc Vitals Group     BP  (!) 135/91     Pulse Rate 93     Resp 16     Temp 97.9 F (36.6 C)     Temp src      SpO2 96 %     Weight      Height      Head Circumference      Peak Flow      Pain Score 0     Pain Loc      Pain Edu?      Excl. in [place]?    No data found.  Updated Vital Signs BP (!) 135/91   Pulse 93   Temp 97.9 F (36.6 C)   Resp 16   LMP 07/23/2021 (Approximate)   SpO2 96%   Visual Acuity Right Eye Distance:   Left Eye Distance:   Bilateral Distance:    Right Eye Near:   Left Eye Near:    Bilateral Near:     Physical Exam Vitals reviewed.  Constitutional:      Appearance: Normal appearance.  Cardiovascular:     Rate and Rhythm: Normal rate and regular rhythm.     Pulses: Normal pulses.     Heart sounds: Normal heart sounds.  Pulmonary:     Effort: Pulmonary effort is normal.     Breath sounds: Decreased air movement present. Wheezing present.     Comments: Severe cough is triggered by deep breath.  Inspiratory wheezes are present.  Decreased air movement is evident. Neurological:     Mental Status: She is alert.      UC Treatments / Results  Labs (all labs ordered are listed, but only abnormal results are displayed) Labs Reviewed - No data to display  EKG   Radiology No results found.  Procedures Procedures (including critical care time)  Medications Ordered in UC Medications - No data to display  Initial Impression / Assessment and Plan / UC Course  I have reviewed the triage vital signs and the nursing notes.  Pertinent labs & imaging results that were available during my care of the patient were reviewed by me and considered in my medical decision making (see chart for details).   Acute exacerbation of asthma.  Wheezing present on exam with decreased breath sounds.  Will treat with course of prednisone and ask her to follow-up with her primary care or pulmonary provider.   Final Clinical Impressions(s) / UC Diagnoses   Final diagnoses:  None    Discharge Instructions  None    ED Prescriptions   None    PDMP not reviewed this encounter.   Charma Igo, [place] 12/03/21 1154

## 2021-12-03 NOTE — Discharge Instructions (Signed)
Follow up here or with your primary care provider if your symptoms are worsening or not improving with treatment.     

## 2021-12-03 NOTE — ED Triage Notes (Signed)
Pt. Present to UC w/ c/o wheezing and a cough for the past week. Pt's states she is concerned for an asthma exacerbation. Pt. Has been self treating w/ tessalon pearls and her nebulizer treatment.

## 2021-12-05 ENCOUNTER — Encounter: Payer: Self-pay | Admitting: Internal Medicine

## 2021-12-05 DIAGNOSIS — Z9109 Other allergy status, other than to drugs and biological substances: Secondary | ICD-10-CM

## 2021-12-05 MED ORDER — LEVALBUTEROL HCL 0.31 MG/3ML IN NEBU
INHALATION_SOLUTION | RESPIRATORY_TRACT | 1 refills | Status: DC
Start: 2021-12-05 — End: 2021-12-18

## 2021-12-10 ENCOUNTER — Ambulatory Visit (INDEPENDENT_AMBULATORY_CARE_PROVIDER_SITE_OTHER): Payer: 59

## 2021-12-10 ENCOUNTER — Other Ambulatory Visit: Payer: Self-pay

## 2021-12-10 ENCOUNTER — Ambulatory Visit
Admission: RE | Admit: 2021-12-10 | Discharge: 2021-12-10 | Disposition: A | Discharge status: Home / Self Care | Payer: 59 | Source: Ambulatory Visit | Attending: Family Medicine | Admitting: Family Medicine

## 2021-12-10 VITALS — BP 150/92 | HR 112 | Temp 98.7°F | Resp 16 | Wt 260.0 lb

## 2021-12-10 DIAGNOSIS — J4541 Moderate persistent asthma with (acute) exacerbation: Secondary | ICD-10-CM

## 2021-12-10 DIAGNOSIS — R0602 Shortness of breath: Secondary | ICD-10-CM | POA: Diagnosis not present

## 2021-12-10 DIAGNOSIS — R059 Cough, unspecified: Secondary | ICD-10-CM

## 2021-12-10 MED ORDER — METHYLPREDNISOLONE SODIUM SUCC 125 MG IJ SOLR
125.0000 mg | Freq: Once | INTRAMUSCULAR | Status: AC
  Administered 2021-12-10: 125 mg via INTRAMUSCULAR

## 2021-12-10 MED ORDER — PREDNISONE 10 MG (21) PO TBPK: ORAL_TABLET | Freq: Every day | ORAL | 0 refills | Status: DC

## 2021-12-10 MED ORDER — ALBUTEROL SULFATE (2.5 MG/3ML) 0.083% IN NEBU
2.5000 mg | INHALATION_SOLUTION | Freq: Once | RESPIRATORY_TRACT | Status: AC
Start: 2021-12-10 — End: 2021-12-10
  Administered 2021-12-10: 2.5 mg via RESPIRATORY_TRACT

## 2021-12-10 MED ORDER — DOXYCYCLINE HYCLATE 100 MG PO CAPS: 100.0000 mg | ORAL_CAPSULE | Freq: Two times a day (BID) | ORAL | 0 refills | Status: AC

## 2021-12-10 NOTE — Discharge Instructions (Addendum)
Advised patient to take medication as directed with food to completion.  Advised patient to take Sterapred Unipak with first dose of doxycycline for the next 10 days.  Encouraged patient increase daily water intake while taking these medications.  Advised if symptoms worsen and/or unresolved please go to nearest ED, pulmonology, or PCP for further evaluation.

## 2021-12-10 NOTE — ED Provider Notes (Signed)
[facility]    CSN: [id number] Arrival date & time: 12/10/21  1131      History   Chief Complaint Chief Complaint  Patient presents with   Cough    Asthma and bad cough was in urgent care last week and was given prednisone for 5 days and am still coughing very badly been taking tessalon perles as well. Need to be seen possibly need anti biotics. - Entered by patient    HPI Tracy Medina is a 31 y.o. female.   HPI 31 year old female presents with cough and shortness of breath for 2 weeks.  PMH significant for morbid obesity, asthma, and insomnia.  Past Medical History:  Diagnosis Date   Anxiety    Arthritis    Asthma    Depression    Endometriosis     Patient Active Problem List   Diagnosis Date Noted   Morbid obesity (HCC) 05/30/2021   Insomnia 05/24/2020   Elevated blood pressure reading in office without diagnosis of hypertension 05/24/2020   Endometriosis 02/29/2020   Iron deficiency 02/29/2020   Anxiety and depression 02/29/2020   Asthma 02/29/2020   OA (osteoarthritis of the spine) 02/29/2020    Past Surgical History:  Procedure Laterality Date   ABDOMINAL HYSTERECTOMY     CHOLECYSTECTOMY     LAPAROSCOPY     TONSILLECTOMY AND ADENOIDECTOMY     TOTAL LAPAROSCOPIC HYSTERECTOMY WITH SALPINGECTOMY Bilateral 08/21/2021   Procedure: TOTAL LAPAROSCOPIC HYSTERECTOMY WITH SALPINGECTOMY;  Surgeon: Linzie Collin, MD;  Location: [facility] ORS;  Service: Gynecology;  Laterality: Bilateral;   TYMPANOSTOMY TUBE PLACEMENT      OB History     Gravida  0   Para  0   Term  0   Preterm  0   AB  0   Living  0      SAB  0   IAB  0   Ectopic  0   Multiple  0   Live Births  0            Home Medications    Prior to Admission medications   Medication Sig Start Date End Date Taking? Authorizing Provider  doxycycline (VIBRAMYCIN) 100 MG capsule Take 1 capsule (100 mg total) by mouth 2 (two) times daily for 10 days. 12/10/21 12/20/21  Yes Trevor Iha, FNP  predniSONE (STERAPRED UNI-PAK 21 TAB) 10 MG (21) TBPK tablet Take by mouth daily. Take 6 tabs by mouth daily  for 2 days, then 5 tabs for 2 days, then 4 tabs for 2 days, then 3 tabs for 2 days, 2 tabs for 2 days, then 1 tab by mouth daily for 2 days 12/10/21  Yes Trevor Iha, FNP  albuterol (VENTOLIN HFA) 108 (90 Base) MCG/ACT inhaler TAKE 2 PUFFS BY MOUTH EVERY 6 HOURS AS NEEDED FOR WHEEZE OR SHORTNESS OF BREATH 11/25/20   Lorre Munroe, NP  buPROPion (WELLBUTRIN XL) 150 MG 24 hr tablet Take 1 tablet (150 mg total) by mouth daily. 09/29/21   Lorre Munroe, NP  cyclobenzaprine (FLEXERIL) 10 MG tablet Take 1 tablet (10 mg total) by mouth 2 (two) times daily as needed for muscle spasms. 06/10/21   Mickie Bail, NP  eszopiclone (LUNESTA) 2 MG TABS tablet Take 1 tablet (2 mg total) by mouth at bedtime as needed for sleep. Take immediately before bedtime 09/29/21   Lorre Munroe, NP  etonogestrel (NEXPLANON) 68 MG IMPL implant 1 each by Subdermal route once. 04/08/20 08/14/21  [provider]  FLOVENT HFA 44 MCG/ACT inhaler INHALE 2 PUFFS INTO THE LUNGS TWICE A DAY 11/30/20   Jearld Fenton, NP  FLUoxetine (PROZAC) 40 MG capsule Take 1 capsule (40 mg total) by mouth daily. 12/22/20   Jearld Fenton, NP  levalbuterol (XOPENEX) 0.31 MG/3ML nebulizer solution TAKE 3 MLS (0.31 MG TOTAL) BY NEBULIZATION EVERY 4 (FOUR) HOURS AS NEEDED FOR WHEEZING. 12/05/21   Baity, Coralie Keens, NP    Family History Family History  Adopted: Yes  Problem Relation Age of Onset   Hypertension Mother     Social History Social History   Tobacco Use   Smoking status: Never   Smokeless tobacco: Never  Vaping Use   Vaping Use: Never used  Substance Use Topics   Alcohol use: Yes    Alcohol/week: 2.0 standard drinks of alcohol    Types: 1 Cans of beer, 1 Shots of liquor per week    Comment: social   Drug use: Never     Allergies   Cefaclor   Review of Systems Review of Systems   Respiratory:  Positive for cough and shortness of breath.   All other systems reviewed and are negative.    Physical Exam Triage Vital Signs ED Triage Vitals  Enc Vitals Group     BP      Pulse      Resp      Temp      Temp src      SpO2      Weight      Height      Head Circumference      Peak Flow      Pain Score      Pain Loc      Pain Edu?      Excl. in [place]?    No data found.  Updated Vital Signs BP (!) 150/92 (BP Location: Left Arm)   Pulse (!) 112   Temp 98.7 F (37.1 C) (Oral)   Resp 16   Wt 260 lb (117.9 kg)   LMP 07/23/2021 (Approximate)   SpO2 94%   BMI 40.72 kg/m   Physical Exam Vitals and nursing note reviewed.  Constitutional:      Appearance: Normal appearance. She is obese. She is ill-appearing.  HENT:     Head: Normocephalic and atraumatic.     Right Ear: Tympanic membrane, ear canal and external ear normal.     Left Ear: Tympanic membrane, ear canal and external ear normal.     Mouth/Throat:     Mouth: Mucous membranes are moist.     Pharynx: Oropharynx is clear.  Eyes:     Extraocular Movements: Extraocular movements intact.     Conjunctiva/sclera: Conjunctivae normal.     Pupils: Pupils are equal, round, and reactive to light.  Cardiovascular:     Rate and Rhythm: Normal rate and regular rhythm.     Pulses: Normal pulses.  Pulmonary:     Breath sounds: No wheezing, rhonchi or rales.     Comments: Diffuse scattered rhonchi, diminished breath sounds bibasilarly, frequent nonproductive cough noted on exam Musculoskeletal:        General: Normal range of motion.     Cervical back: Normal range of motion and neck supple.  Skin:    General: Skin is warm and dry.  Neurological:     General: No focal deficit present.     Mental Status: She is alert and oriented to person, place, and time. Mental status is  at baseline.      UC Treatments / Results  Labs (all labs ordered are listed, but only abnormal results are displayed) Labs  Reviewed - No data to display  EKG   Radiology DG Chest 2 View  Result Date: 12/10/2021 CLINICAL DATA:  Cough, shortness of breath x1 week EXAM: CHEST - 2 VIEW COMPARISON:  04/13/2021 FINDINGS: The heart size and mediastinal contours are within normal limits. Both lungs are clear. The visualized skeletal structures are unremarkable. IMPRESSION: No active cardiopulmonary disease. Electronically Signed   By: Elmer Picker M.D.   On: 12/10/2021 12:55    Procedures Procedures (including critical care time)  Medications Ordered in UC Medications  methylPREDNISolone sodium succinate (SOLU-MEDROL) 125 mg/2 mL injection 125 mg (125 mg Intramuscular Given 12/10/21 1318)  albuterol (PROVENTIL) (2.5 MG/3ML) 0.083% nebulizer solution 2.5 mg (2.5 mg Nebulization Given 12/10/21 1309)    Initial Impression / Assessment and Plan / UC Course  I have reviewed the triage vital signs and the nursing notes.  Pertinent labs & imaging results that were available during my care of the patient were reviewed by me and considered in my medical decision making (see chart for details).     MDM: 1. Cough-CXR revealed above, Rx'd doxycycline; 2.  Moderate persistent asthma with exacerbation-DuoNeb nebulizer and Solu-Medrol IM 125 given once in clinic Rx'd Sterapred Unipak. Advised patient to take medication as directed with food to completion.  Advised patient to take Sterapred Unipak with first dose of doxycycline for the next 10 days.  Encouraged patient increase daily water intake while taking these medications.  Advised if symptoms worsen and/or unresolved please go to nearest ED, pulmonology, or PCP for further evaluation.  Work note provided to patient prior to discharge.  Patient discharged home, hemodynamically stable. Final Clinical Impressions(s) / UC Diagnoses   Final diagnoses:  Cough, unspecified type  Moderate persistent asthma with exacerbation     Discharge Instructions      Advised  patient to take medication as directed with food to completion.  Advised patient to take Sterapred Unipak with first dose of doxycycline for the next 10 days.  Encouraged patient increase daily water intake while taking these medications.  Advised if symptoms worsen and/or unresolved please go to nearest ED, pulmonology, or PCP for further evaluation.     ED Prescriptions     Medication Sig Dispense Auth. Provider   doxycycline (VIBRAMYCIN) 100 MG capsule Take 1 capsule (100 mg total) by mouth 2 (two) times daily for 10 days. 20 capsule Eliezer Lofts, FNP   predniSONE (STERAPRED UNI-PAK 21 TAB) 10 MG (21) TBPK tablet Take by mouth daily. Take 6 tabs by mouth daily  for 2 days, then 5 tabs for 2 days, then 4 tabs for 2 days, then 3 tabs for 2 days, 2 tabs for 2 days, then 1 tab by mouth daily for 2 days 42 tablet Eliezer Lofts, FNP      PDMP not reviewed this encounter.   Eliezer Lofts, [place] 12/10/21 1342

## 2021-12-10 NOTE — ED Triage Notes (Signed)
Cough and SOB onset for two weeks.

## 2021-12-12 ENCOUNTER — Encounter: Payer: Self-pay | Admitting: Internal Medicine

## 2021-12-12 NOTE — Telephone Encounter (Signed)
This is something she is going to have to have an appt for.

## 2021-12-17 ENCOUNTER — Other Ambulatory Visit: Payer: Self-pay

## 2021-12-17 ENCOUNTER — Encounter: Payer: Self-pay | Admitting: Emergency Medicine

## 2021-12-17 ENCOUNTER — Emergency Department
Admission: EM | Admit: 2021-12-17 | Discharge: 2021-12-17 | Disposition: A | Discharge status: Home / Self Care | Payer: 59 | Attending: Emergency Medicine | Admitting: Emergency Medicine

## 2021-12-17 ENCOUNTER — Emergency Department: Payer: 59

## 2021-12-17 DIAGNOSIS — Z1152 Encounter for screening for COVID-19: Secondary | ICD-10-CM | POA: Diagnosis not present

## 2021-12-17 DIAGNOSIS — R030 Elevated blood-pressure reading, without diagnosis of hypertension: Secondary | ICD-10-CM | POA: Diagnosis not present

## 2021-12-17 DIAGNOSIS — R059 Cough, unspecified: Secondary | ICD-10-CM | POA: Diagnosis present

## 2021-12-17 DIAGNOSIS — J40 Bronchitis, not specified as acute or chronic: Secondary | ICD-10-CM | POA: Insufficient documentation

## 2021-12-17 DIAGNOSIS — R791 Abnormal coagulation profile: Secondary | ICD-10-CM | POA: Diagnosis not present

## 2021-12-17 LAB — COMPREHENSIVE METABOLIC PANEL
ALT: 17 U/L (ref 0–44)
AST: 18 U/L (ref 15–41)
Albumin: 4 g/dL (ref 3.5–5.0)
Alkaline Phosphatase: 107 U/L (ref 38–126)
Anion gap: 8 (ref 5–15)
BUN: 13 mg/dL (ref 6–20)
CO2: 23 mmol/L (ref 22–32)
Calcium: 9.3 mg/dL (ref 8.9–10.3)
Chloride: 104 mmol/L (ref 98–111)
Creatinine, Ser: 0.88 mg/dL (ref 0.44–1.00)
GFR, Estimated: >60 mL/min (ref >60)
Glucose, Bld: 104 mg/dL — ABNORMAL HIGH (ref 70–99)
Potassium: 3.9 mmol/L (ref 3.5–5.1)
Sodium: 135 mmol/L (ref 135–145)
Total Bilirubin: 0.8 mg/dL (ref 0.3–1.2)
Total Protein: 7.9 g/dL (ref 6.5–8.1)

## 2021-12-17 LAB — CBC
HCT: 42.9 % (ref 36.0–46.0)
Hemoglobin: 14.3 g/dL (ref 12.0–15.0)
MCH: 29.1 pg (ref 26.0–34.0)
MCHC: 33.3 g/dL (ref 30.0–36.0)
MCV: 87.4 fL (ref 80.0–100.0)
Platelets: 383 10*3/uL (ref 150–400)
RBC: 4.91 MIL/uL (ref 3.87–5.11)
RDW: 12.8 % (ref 11.5–15.5)
WBC: 17 10*3/uL — ABNORMAL HIGH (ref 4.0–10.5)
nRBC: 0 % (ref 0.0–0.2)

## 2021-12-17 LAB — SARS CORONAVIRUS 2 BY RT PCR: SARS Coronavirus 2 by RT PCR: NEGATIVE

## 2021-12-17 LAB — D-DIMER, QUANTITATIVE: D-Dimer, Quant: 0.84 ug/mL-FEU — ABNORMAL HIGH (ref 0.00–0.50)

## 2021-12-17 MED ORDER — SODIUM CHLORIDE 0.9 % IV BOLUS
1000.0000 mL | Freq: Once | INTRAVENOUS | Status: AC
  Administered 2021-12-17: 1000 mL via INTRAVENOUS

## 2021-12-17 MED ORDER — PREDNISONE 10 MG PO TABS: ORAL_TABLET | ORAL | 0 refills | Status: DC

## 2021-12-17 MED ORDER — IPRATROPIUM-ALBUTEROL 0.5-2.5 (3) MG/3ML IN SOLN
3.0000 mL | Freq: Once | RESPIRATORY_TRACT | Status: AC
  Administered 2021-12-17: 3 mL via RESPIRATORY_TRACT
  Filled 2021-12-17: qty 3

## 2021-12-17 MED ORDER — HYDROCOD POLI-CHLORPHE POLI ER 10-8 MG/5ML PO SUER: 5.0000 mL | Freq: Two times a day (BID) | ORAL | 0 refills | Status: DC | PRN

## 2021-12-17 MED ORDER — HYDROCOD POLI-CHLORPHE POLI ER 10-8 MG/5ML PO SUER
5.0000 mL | Freq: Once | ORAL | Status: AC
  Administered 2021-12-17: 5 mL via ORAL
  Filled 2021-12-17: qty 5

## 2021-12-17 MED ORDER — ALBUTEROL SULFATE HFA 108 (90 BASE) MCG/ACT IN AERS
2.0000 | INHALATION_SPRAY | RESPIRATORY_TRACT | Status: DC | PRN
  Filled 2021-12-17: qty 6.7

## 2021-12-17 MED ORDER — IOHEXOL 350 MG/ML SOLN
75.0000 mL | Freq: Once | INTRAVENOUS | Status: AC | PRN
  Administered 2021-12-17: 75 mL via INTRAVENOUS

## 2021-12-17 NOTE — ED Notes (Signed)
Urine sample obtained and sent to pharmacy.

## 2021-12-17 NOTE — Discharge Instructions (Addendum)
Continue with antibiotic until completely finished.  Also continue with your nebulizer machine and solution that you have at home.  You may use it every 4-6 hours as needed for coughing or wheezing or shortness of breath.  A prescription for Tussionex was sent to the pharmacy to take every 12 hours.  This medication is a narcotic cough medication and should not be taken while driving or operating machinery.  Also the prednisone is 30 mg once a day for 7 days.  Follow-up with your primary care provider if not improving in the next 5 days for reevaluation.

## 2021-12-17 NOTE — ED Triage Notes (Signed)
Pt states that she has had a cough for the last several weeks and been to urgent care twice, and taking breathing treatments TID. Pt reports at time difficult to get a deep breath

## 2021-12-17 NOTE — ED Provider Notes (Signed)
[facility] Provider Note    Event Date/Time   First MD Initiated Contact with Patient 12/17/21 [phone]     (approximate)   History   Cough   HPI  Tracy Medina is a 31 y.o. female   presents to the ED with complaint of cough for the last several weeks and has been to urgent care twice.  Currently she is taking doxycycline and using her nebulizer machine 3 times a day.  Patient states that she also has been on prednisone but started at a high dose at 60 mg which made her cry and feel depressed.  She abruptly stopped this at 60 mg and did not take any more medication.  Patient also has a hand-held albuterol inhaler at home.  She denies any recent fever, nausea, vomiting or diarrhea.  Denies history of anxiety, asthma, depression, insomnia, osteoarthritis, and elevated blood pressure without diagnosis of hypertension.      Physical Exam   Triage Vital Signs: ED Triage Vitals  Enc Vitals Group     BP 12/17/21 0839 (!) 148/110     Pulse Rate 12/17/21 0839 (!) 131     Resp 12/17/21 0839 (!) 24     Temp 12/17/21 0839 98.9 F (37.2 C)     Temp src --      SpO2 12/17/21 0839 95 %     Weight 12/17/21 0840 265 lb (120.2 kg)     Height 12/17/21 0840 5\' 7"  (1.702 m)     Head Circumference --      Peak Flow --      Pain Score 12/17/21 0840 0     Pain Loc --      Pain Edu? --      Excl. in GC? --     Most recent vital signs: Vitals:   12/17/21 0839 12/17/21 1238  BP: (!) 148/110 (!) 112/93  Pulse: (!) 131 90  Resp: (!) 24 20  Temp: 98.9 F (37.2 C) 98.2 F (36.8 C)  SpO2: 95% 100%     General: Awake, no distress.  CV:  Good peripheral perfusion.  Resp:  Normal effort.  Lungs are clear bilaterally and no wheezes, rales or rhonchi are noted.  No accessory muscles are being used and no stridor heard.  During exam there was a nonproductive cough noted. Abd:  No distention.  Other:     ED Results / Procedures / Treatments   Labs (all labs  ordered are listed, but only abnormal results are displayed) Labs Reviewed  CBC - Abnormal; Notable for the following components:      Result Value   WBC 17.0 (*)    All other components within normal limits  COMPREHENSIVE METABOLIC PANEL - Abnormal; Notable for the following components:   Glucose, Bld 104 (*)    All other components within normal limits  D-DIMER, QUANTITATIVE - Abnormal; Notable for the following components:   D-Dimer, Quant 0.84 (*)    All other components within normal limits  SARS CORONAVIRUS 2 BY RT PCR     EKG  PR interval 148 ms QRS duration 84 ms QT/QTcB 300/411 ms P-R-T axes 70 73 45 Sinus tachycardia Possible Left atrial enlargement Borderline ECG No previous ECGs available    RADIOLOGY  Chest x-ray per radiologist showed peribronchial thickening suggestive of bronchitis.  CT angio chest for PE per radiologist was negative for PE.  Was changes to suggest bronchiolitis versus inflammation.    PROCEDURES:  Critical Care performed:  Procedures   MEDICATIONS ORDERED IN ED: Medications  albuterol (VENTOLIN HFA) 108 (90 Base) MCG/ACT inhaler 2 puff (has no administration in time range)  sodium chloride 0.9 % bolus 1,000 mL (0 mLs Intravenous Stopped 12/17/21 1318)  iohexol (OMNIPAQUE) 350 MG/ML injection 75 mL (75 mLs Intravenous Contrast Given 12/17/21 1027)  ipratropium-albuterol (DUONEB) 0.5-2.5 (3) MG/3ML nebulizer solution 3 mL (3 mLs Nebulization Given 12/17/21 1228)  chlorpheniramine-HYDROcodone (TUSSIONEX) 10-8 MG/5ML suspension 5 mL (5 mLs Oral Given 12/17/21 1235)     IMPRESSION / MDM / ASSESSMENT AND PLAN / ED COURSE  I reviewed the triage vital signs and the nursing notes.   Differential diagnosis includes, but is not limited to, upper respiratory infection, COVID, bronchitis, pneumonia, pulmonary embolus.  31 year old female presents to the ED with several weeks history of coughing which has been unrelieved by steroids that  patient infrequently and incorrectly took.  Patient currently has taken doxycycline and using a nebulizer treatment at home.  Patient was noted to be initially tachycardic in the triage area the heart rate of 131.  Patient denied any recent travels, estrogen therapy, smoking or previous DVT or PEs.  There was still a suspicion for a possible PE due to her D-dimer being elevated at 0.84, WBC 17,000, CMP within normal limits and COVID test was negative.  CT scan angio chest was negative for PE but did show findings suspicious for a bronchitis bronchiolitis.  Patient is to remain on the doxycycline that she is currently taking.  A albuterol nebulizer treatment was given while she was in the emergency department and the tubing for this was sent home with her to use rather than the mask that she has been using.  We discussed starting with a course of prednisone at 30 mg once daily to see if this is less likely to cause any emotional distress for her.  Prescription for Tussionex was sent to the pharmacy to help control some of the coughing.  Patient is strongly encouraged to follow-up with her PCP if any continued problems or not improving.      Patient's presentation is most consistent with acute illness / injury with system symptoms.  FINAL CLINICAL IMPRESSION(S) / ED DIAGNOSES   Final diagnoses:  Bronchitis     Rx / DC Orders   ED Discharge Orders          Ordered    predniSONE (DELTASONE) 10 MG tablet        12/17/21 1249    chlorpheniramine-HYDROcodone (TUSSIONEX) 10-8 MG/5ML  Every 12 hours PRN        12/17/21 1249             Note:  This document was prepared using Dragon voice recognition software and may include unintentional dictation errors.   Johnn Hai, PA-C 12/17/21 1601    Lucillie Garfinkel, MD 12/18/21 2059

## 2021-12-18 ENCOUNTER — Telehealth (INDEPENDENT_AMBULATORY_CARE_PROVIDER_SITE_OTHER): Payer: 59 | Admitting: Internal Medicine

## 2021-12-18 ENCOUNTER — Encounter: Payer: Self-pay | Admitting: Internal Medicine

## 2021-12-18 DIAGNOSIS — J4541 Moderate persistent asthma with (acute) exacerbation: Secondary | ICD-10-CM

## 2021-12-18 MED ORDER — FLUTICASONE PROPIONATE HFA 110 MCG/ACT IN AERO: 2.0000 | INHALATION_SPRAY | Freq: Two times a day (BID) | RESPIRATORY_TRACT | 1 refills | Status: DC

## 2021-12-18 MED ORDER — IPRATROPIUM-ALBUTEROL 0.5-2.5 (3) MG/3ML IN SOLN: 3.0000 mL | Freq: Three times a day (TID) | RESPIRATORY_TRACT | 0 refills | Status: DC | PRN

## 2021-12-18 NOTE — Patient Instructions (Signed)
Lung Conditions and Lifestyle Changes This video will teach you important lifestyle changes to make for people with chronic lung disease. To view the content, go to this web address: [email or web address]  This video will expire on: 09/27/2023. If you need access to this video following this date, please reach out to the healthcare provider who assigned it to you. This information is not intended to replace advice given to you by your health care provider. Make sure you discuss any questions you have with your health care provider. Elsevier Patient Education  2023 [employer].

## 2021-12-18 NOTE — Progress Notes (Signed)
Virtual Visit via Video Note  I connected with Tracy Medina on 12/18/21 at 11:20 AM EST by a video enabled telemedicine application and verified that I am speaking with the correct person using two identifiers.  Location: Patient: Home Provider: Office   I discussed the limitations of evaluation and management by telemedicine and the availability of in person appointments. The patient expressed understanding and agreed to proceed.  History of Present Illness:  Patient due for urgent care/ER follow-up.  She initially presented to UC on 11/12 with complaint of cough and wheezing.  She was diagnosed with an asthma exacerbation and prescribed Prednisone.  She presented back to [facility] 11/19 with complaint of cough and shortness of breath.  Her chest x-ray was normal.  She was given Solu-Medrol 125 mg, given a DuoNeb and prescribed Doxycycline 100 mg twice daily and a Prednisone taper.  She stopped taking the Prednisone because she felt like it was making her emotional.  She subsequently presented to the ER 1/27 with the same complaints.  Her ECG was unremarkable.  Her COVID test was negative.  Labs revealed an elevated WBC count of 17 and a marginally elevated D-dimer.  Chest x-ray was consistent with bronchitis.  CTA of the chest was negative for PE.  She was treated with IV fluids, DuoNeb and Rx provided for Prednisone and Tussionex.  Since that time, she reports persistent nasal congestion, sore throat, cough and wheezing. She is not taking the Prednisone but is taking Albuterol, Levoalbuterol, Flovent, Doxycycline and Tussionex.    Past Medical History:  Diagnosis Date   Anxiety    Arthritis    Asthma    Depression    Endometriosis     Current Outpatient Medications  Medication Sig Dispense Refill   albuterol (VENTOLIN HFA) 108 (90 Base) MCG/ACT inhaler TAKE 2 PUFFS BY MOUTH EVERY 6 HOURS AS NEEDED FOR WHEEZE OR SHORTNESS OF BREATH 8.5 each 0   buPROPion (WELLBUTRIN XL) 150 MG 24 hr tablet  Take 1 tablet (150 mg total) by mouth daily. 90 tablet 1   chlorpheniramine-HYDROcodone (TUSSIONEX) 10-8 MG/5ML Take 5 mLs by mouth every 12 (twelve) hours as needed for cough. 115 mL 0   cyclobenzaprine (FLEXERIL) 10 MG tablet Take 1 tablet (10 mg total) by mouth 2 (two) times daily as needed for muscle spasms. 20 tablet 0   doxycycline (VIBRAMYCIN) 100 MG capsule Take 1 capsule (100 mg total) by mouth 2 (two) times daily for 10 days. 20 capsule 0   eszopiclone (LUNESTA) 2 MG TABS tablet Take 1 tablet (2 mg total) by mouth at bedtime as needed for sleep. Take immediately before bedtime 30 tablet 0   etonogestrel (NEXPLANON) 68 MG IMPL implant 1 each by Subdermal route once.     FLOVENT HFA 44 MCG/ACT inhaler INHALE 2 PUFFS INTO THE LUNGS TWICE A DAY 10.6 each 1   FLUoxetine (PROZAC) 40 MG capsule Take 1 capsule (40 mg total) by mouth daily. 90 capsule 2   levalbuterol (XOPENEX) 0.31 MG/3ML nebulizer solution TAKE 3 MLS (0.31 MG TOTAL) BY NEBULIZATION EVERY 4 (FOUR) HOURS AS NEEDED FOR WHEEZING. 90 mL 1   predniSONE (DELTASONE) 10 MG tablet Take 3 tablets once a day for 7 days 21 tablet 0   No current facility-administered medications for this visit.    Allergies  Allergen Reactions   Cefaclor Hives and Rash    Family History  Adopted: Yes  Problem Relation Age of Onset   Hypertension Mother     Social  History   Socioeconomic History   Marital status: Single    Spouse name: Not on file   Number of children: Not on file   Years of education: Not on file   Highest education level: Not on file  Occupational History   Not on file  Tobacco Use   Smoking status: Never   Smokeless tobacco: Never  Vaping Use   Vaping Use: Never used  Substance and Sexual Activity   Alcohol use: Yes    Alcohol/week: 2.0 standard drinks of alcohol    Types: 1 Cans of beer, 1 Shots of liquor per week    Comment: social   Drug use: Never   Sexual activity: Not Currently    Birth control/protection:  Surgical    Comment: hysterectomy  Other Topics Concern   Not on file  Social History Narrative   Not on file   Social Determinants of Health   Financial Resource Strain: Not on file  Food Insecurity: Not on file  Transportation Needs: Not on file  Physical Activity: Not on file  Stress: Not on file  Social Connections: Not on file  Intimate Partner Violence: Not on file     Constitutional: Denies fever, malaise, fatigue, headache or abrupt weight changes.  HEENT: Pt reports nasal congestion and sore throat. Denies eye pain, eye redness, ear pain, ringing in the ears, wax buildup, runny nose, bloody nose. Respiratory: Pt reports cough, wheezing and shortness of breath. Denies difficulty breathing.   Cardiovascular: Denies chest pain, chest tightness, palpitations or swelling in the hands or feet.   No other specific complaints in a complete review of systems (except as listed in HPI above).  Observations/Objective:  LMP 07/23/2021 (Approximate)   Wt Readings from Last 3 Encounters:  12/17/21 265 lb (120.2 kg)  12/10/21 260 lb (117.9 kg)  09/29/21 273 lb 3.2 oz (123.9 kg)    General: Appears her stated age, appears unwell but in NAD. HEENT: Nose: no congestion noted ; Throat/Mouth: hoarseness noted Pulmonary/Chest: Normal effort, dry cough noted. No respiratory distress.  Neurological: Alert and oriented.   BMET    Component Value Date/Time   NA 135 12/17/2021 0854   K 3.9 12/17/2021 0854   CL 104 12/17/2021 0854   CO2 23 12/17/2021 0854   GLUCOSE 104 (H) 12/17/2021 0854   BUN 13 12/17/2021 0854   CREATININE 0.88 12/17/2021 0854   CREATININE 0.84 05/30/2021 0858   CALCIUM 9.3 12/17/2021 0854   GFRNONAA >60 12/17/2021 0854    Lipid Panel     Component Value Date/Time   CHOL 153 05/30/2021 0858   TRIG 74 05/30/2021 0858   HDL 57 05/30/2021 0858   CHOLHDL 2.7 05/30/2021 0858   LDLCALC 81 05/30/2021 0858    CBC    Component Value Date/Time   WBC 17.0  (H) 12/17/2021 0854   RBC 4.91 12/17/2021 0854   HGB 14.3 12/17/2021 0854   HCT 42.9 12/17/2021 0854   PLT 383 12/17/2021 0854   MCV 87.4 12/17/2021 0854   MCH 29.1 12/17/2021 0854   MCHC 33.3 12/17/2021 0854   RDW 12.8 12/17/2021 0854   LYMPHSABS 2.4 06/10/2021 1933   MONOABS 0.7 06/10/2021 1933   EOSABS 0.2 06/10/2021 1933   BASOSABS 0.1 06/10/2021 1933    Hgb A1C Lab Results  Component Value Date   HGBA1C 5.1 05/30/2021        Assessment and Plan:  UC/ER follow-up for Asthma Exacerbation:  Multiple UC and ER visit notes, labs and  imaging reviewed We will increase her Flovent dose to 110 mcg twice daily We will change her Levoalbuterol to DuoNebs every 8 hours as needed Do not use Albuterol inhaler while taking Duonebs routinely Continue Tussionex and Doxycyline as previously prescribed. Encouraged her to follow-up with pulmonology  RTC in 4 months for annual exam  Follow Up Instructions:    I discussed the assessment and treatment plan with the patient. The patient was provided an opportunity to ask questions and all were answered. The patient agreed with the plan and demonstrated an understanding of the instructions.   The patient was advised to call back or seek an in-person evaluation if the symptoms worsen or if the condition fails to improve as anticipated.    Nicki Reaper, NP

## 2022-01-14 ENCOUNTER — Other Ambulatory Visit: Payer: Self-pay | Admitting: Internal Medicine

## 2022-03-09 ENCOUNTER — Encounter: Payer: Self-pay | Admitting: Internal Medicine

## 2022-03-09 NOTE — Telephone Encounter (Signed)
Virtual is fine

## 2022-03-09 NOTE — Telephone Encounter (Signed)
See below

## 2022-03-13 ENCOUNTER — Encounter: Payer: Self-pay | Admitting: Internal Medicine

## 2022-03-13 ENCOUNTER — Ambulatory Visit (INDEPENDENT_AMBULATORY_CARE_PROVIDER_SITE_OTHER): Payer: BC Managed Care – PPO | Admitting: Internal Medicine

## 2022-03-13 VITALS — BP 124/80 | HR 108 | Temp 96.9°F | Wt 290.0 lb

## 2022-03-13 DIAGNOSIS — F419 Anxiety disorder, unspecified: Secondary | ICD-10-CM | POA: Diagnosis not present

## 2022-03-13 DIAGNOSIS — F32A Depression, unspecified: Secondary | ICD-10-CM | POA: Diagnosis not present

## 2022-03-13 MED ORDER — BUPROPION HCL ER (XL) 300 MG PO TB24: 300.0000 mg | ORAL_TABLET | Freq: Every day | ORAL | 1 refills | Status: DC

## 2022-03-13 NOTE — Patient Instructions (Signed)

## 2022-03-13 NOTE — Assessment & Plan Note (Signed)
Deteriorated Increase bupropion to 300 mg daily Fluoxetine 40 mg daily She is considering looking for a new therapist Support offered

## 2022-03-13 NOTE — Progress Notes (Signed)
Subjective:    Patient ID: Tracy Medina, female    DOB: [birth year], 32 y.o.   MRN: [id number]  HPI  Patient presents to clinic today for follow-up of anxiety and depression.  This is a chronic issue that has been worse lately.  She constantly feels on edge, has crying spells. She has had more social anxiety. This is currently managed on Bupropion and Fluoxetine.  She is not currently seeing a therapist. She denies any new stressors. She has been at the same job since November, she bought a house 1 year ago. She has a roommate that she has had for 6 months.  Review of Systems     Past Medical History:  Diagnosis Date   Anxiety    Arthritis    Asthma    Depression    Endometriosis     Current Outpatient Medications  Medication Sig Dispense Refill   albuterol (VENTOLIN HFA) 108 (90 Base) MCG/ACT inhaler TAKE 2 PUFFS BY MOUTH EVERY 6 HOURS AS NEEDED FOR WHEEZE OR SHORTNESS OF BREATH 8.5 each 0   buPROPion (WELLBUTRIN XL) 150 MG 24 hr tablet Take 1 tablet (150 mg total) by mouth daily. 90 tablet 1   chlorpheniramine-HYDROcodone (TUSSIONEX) 10-8 MG/5ML Take 5 mLs by mouth every 12 (twelve) hours as needed for cough. 115 mL 0   cyclobenzaprine (FLEXERIL) 10 MG tablet Take 1 tablet (10 mg total) by mouth 2 (two) times daily as needed for muscle spasms. 20 tablet 0   eszopiclone (LUNESTA) 2 MG TABS tablet Take 1 tablet (2 mg total) by mouth at bedtime as needed for sleep. Take immediately before bedtime 30 tablet 0   etonogestrel (NEXPLANON) 68 MG IMPL implant 1 each by Subdermal route once.     FLUoxetine (PROZAC) 40 MG capsule Take 1 capsule (40 mg total) by mouth daily. 90 capsule 2   fluticasone (FLOVENT HFA) 110 MCG/ACT inhaler Inhale 2 puffs into the lungs 2 (two) times daily. 3 each 1   ipratropium-albuterol (DUONEB) 0.5-2.5 (3) MG/3ML SOLN TAKE 3 MLS BY NEBULIZATION EVERY 8 (EIGHT) HOURS AS NEEDED. 360 mL 0   predniSONE (DELTASONE) 10 MG tablet Take 3 tablets once a day for 7  days 21 tablet 0   No current facility-administered medications for this visit.    Allergies  Allergen Reactions   Cefaclor Hives and Rash    Family History  Adopted: Yes  Problem Relation Age of Onset   Hypertension Mother     Social History   Socioeconomic History   Marital status: Single    Spouse name: Not on file   Number of children: Not on file   Years of education: Not on file   Highest education level: Not on file  Occupational History   Not on file  Tobacco Use   Smoking status: Never   Smokeless tobacco: Never  Vaping Use   Vaping Use: Never used  Substance and Sexual Activity   Alcohol use: Yes    Alcohol/week: 2.0 standard drinks of alcohol    Types: 1 Cans of beer, 1 Shots of liquor per week    Comment: social   Drug use: Never   Sexual activity: Not Currently    Birth control/protection: Surgical    Comment: hysterectomy  Other Topics Concern   Not on file  Social History Narrative   Not on file   Social Determinants of Health   Financial Resource Strain: Not on file  Food Insecurity: Not on file  Transportation Needs: Not  on file  Physical Activity: Not on file  Stress: Not on file  Social Connections: Not on file  Intimate Partner Violence: Not on file     Constitutional: Denies fever, malaise, fatigue, headache or abrupt weight changes.  Respiratory: Denies difficulty breathing, shortness of breath, cough or sputum production.   Cardiovascular: Denies chest pain, chest tightness, palpitations or swelling in the hands or feet.  Neurological: Denies dizziness, difficulty with memory, difficulty with speech or problems with balance and coordination.  Psych: Patient has a history of anxiety and depression.  Denies SI/HI.  No other specific complaints in a complete review of systems (except as listed in HPI above).  Objective:   Physical Exam  BP 124/80 (BP Location: Left Arm, Patient Position: Sitting, Cuff Size: Large)   Pulse (!)  108   Temp (!) 96.9 F (36.1 C) (Temporal)   Wt 290 lb (131.5 kg)   LMP 07/23/2021 (Approximate)   SpO2 100%   BMI 45.42 kg/m   Wt Readings from Last 3 Encounters:  12/17/21 265 lb (120.2 kg)  12/10/21 260 lb (117.9 kg)  09/29/21 273 lb 3.2 oz (123.9 kg)    General: Appears her stated age, obese, in NAD. Cardiovascular: Tachycardic with normal rhythm.  Pulmonary/Chest: Normal effort. Neurological: Alert and oriented. Coordination normal.  Psychiatric: Mood and affect normal.  Anxious appearing.  Having a hard time sitting still.  Judgment and thought content normal.     BMET    Component Value Date/Time   NA 135 12/17/2021 0854   K 3.9 12/17/2021 0854   CL 104 12/17/2021 0854   CO2 23 12/17/2021 0854   GLUCOSE 104 (H) 12/17/2021 0854   BUN 13 12/17/2021 0854   CREATININE 0.88 12/17/2021 0854   CREATININE 0.84 05/30/2021 0858   CALCIUM 9.3 12/17/2021 0854   GFRNONAA >60 12/17/2021 0854    Lipid Panel     Component Value Date/Time   CHOL 153 05/30/2021 0858   TRIG 74 05/30/2021 0858   HDL 57 05/30/2021 0858   CHOLHDL 2.7 05/30/2021 0858   LDLCALC 81 05/30/2021 0858    CBC    Component Value Date/Time   WBC 17.0 (H) 12/17/2021 0854   RBC 4.91 12/17/2021 0854   HGB 14.3 12/17/2021 0854   HCT 42.9 12/17/2021 0854   PLT 383 12/17/2021 0854   MCV 87.4 12/17/2021 0854   MCH 29.1 12/17/2021 0854   MCHC 33.3 12/17/2021 0854   RDW 12.8 12/17/2021 0854   LYMPHSABS 2.4 06/10/2021 1933   MONOABS 0.7 06/10/2021 1933   EOSABS 0.2 06/10/2021 1933   BASOSABS 0.1 06/10/2021 1933    Hgb A1C Lab Results  Component Value Date   HGBA1C 5.1 05/30/2021           Assessment & Plan:   RTC in 2 months for your annual exam Webb Silversmith, NP

## 2022-03-20 ENCOUNTER — Other Ambulatory Visit: Payer: Self-pay | Admitting: Internal Medicine

## 2022-03-20 NOTE — Telephone Encounter (Signed)
Requested medication (s) are due for refill today- no  Requested medication (s) are on the active medication list -yes  Future visit scheduled -yes  Last refill: 12/18/21 3each 1RF  Notes to clinic: Pharmacy request: medication no longer covered by insurance- requesting alternative  Requested Prescriptions  Pending Prescriptions Disp Refills   [place] 90 MCG/ACT inhaler [Pharmacy Med Name: PULMICORT 90 [place]  0     Pulmonology:  Corticosteroids Passed - 03/20/2022  9:04 AM      Passed - Valid encounter within last 12 months    Recent Outpatient Visits           1 week ago Anxiety and depression   [facility] [place], [place] W, NP   3 months ago Moderate persistent asthma with exacerbation   [facility] [place], Coralie Keens, NP   5 months ago Anxiety and depression   [facility] [place], [place] W, NP   8 months ago Acute left-sided low back pain with left-sided sciatica   [facility] [place], Coralie Keens, NP   9 months ago Encounter for general adult medical examination with abnormal findings   [facility] [place], Coralie Keens, NP       Future Appointments             In 2 months Garnette Gunner, Coralie Keens, NP [facility] [place] Prescriptions  Pending Prescriptions Disp Refills   PULMICORT FLEXHALER 90 MCG/ACT inhaler [Pharmacy Med Name: PULMICORT 90 [place]  0     Pulmonology:  Corticosteroids Passed - 03/20/2022  9:04 AM      Passed - Valid encounter within last 12 months    Recent Outpatient Visits           1 week ago Anxiety and depression   [facility] [place], [place] W, NP   3 months ago Moderate persistent asthma with exacerbation   [facility] [place], Coralie Keens, NP   5 months ago Anxiety and depression   [facility] [place], [place] W, NP   8 months ago Acute left-sided low back pain with left-sided sciatica   [facility] [place], Coralie Keens, NP   9 months ago Encounter for general adult medical examination with abnormal findings   [facility] [place], Coralie Keens, NP       Future Appointments             In 2 months Baity, Coralie Keens, NP [facility], [facility]

## 2022-03-21 ENCOUNTER — Other Ambulatory Visit: Payer: Self-pay | Admitting: Internal Medicine

## 2022-03-21 NOTE — Telephone Encounter (Signed)
Requested medication (s) are due for refill today: yes  Requested medication (s) are on the active medication list: yes  Last refill:  03/21/22  Future visit scheduled: yes  Notes to clinic:   Alternative Requested:THE PRESCRIBED MEDICATION IS NOT COVERED BY INSURANCE. PLEASE CONSIDER CHANGING TO ONE OF THE SUGGESTED COVERED ALTERNATIVES.      Requested Prescriptions  Pending Prescriptions Disp Refills   PULMICORT FLEXHALER 90 MCG/ACT inhaler [Pharmacy Med Name: [place]  0     Pulmonology:  Corticosteroids Passed - 03/21/2022  8:15 AM      Passed - Valid encounter within last 12 months    Recent Outpatient Visits           1 week ago Anxiety and depression   [facility] [place], [place] W, NP   3 months ago Moderate persistent asthma with exacerbation   [facility] [place], Coralie Keens, NP   5 months ago Anxiety and depression   [facility] [place], [place] W, NP   8 months ago Acute left-sided low back pain with left-sided sciatica   [facility] [place], Coralie Keens, NP   9 months ago Encounter for general adult medical examination with abnormal findings   [facility] [place], Coralie Keens, NP       Future Appointments             In 2 months Baity, Coralie Keens, NP [facility], [facility]

## 2022-03-22 NOTE — Telephone Encounter (Signed)
See message below °

## 2022-03-22 NOTE — Telephone Encounter (Signed)
Call patient.  Her insurance will not cover Pulmicort or Flovent.  I need to know which asthma inhalers that they will cover for her.

## 2022-03-27 NOTE — Telephone Encounter (Signed)
Pt advised. She is going to call her insurance and send a My Chart Message.  She also states she is going to schedule an appointment sooner than April because the bupropion is not working.   Thanks,   -Mickel Baas

## 2022-03-29 ENCOUNTER — Ambulatory Visit (INDEPENDENT_AMBULATORY_CARE_PROVIDER_SITE_OTHER): Payer: BC Managed Care – PPO | Admitting: Internal Medicine

## 2022-03-29 ENCOUNTER — Encounter: Payer: Self-pay | Admitting: Internal Medicine

## 2022-03-29 VITALS — BP 128/78 | HR 75 | Temp 96.9°F | Wt 295.0 lb

## 2022-03-29 DIAGNOSIS — F419 Anxiety disorder, unspecified: Secondary | ICD-10-CM

## 2022-03-29 DIAGNOSIS — F32A Depression, unspecified: Secondary | ICD-10-CM

## 2022-03-29 MED ORDER — BUSPIRONE HCL 15 MG PO TABS: 15.0000 mg | ORAL_TABLET | Freq: Three times a day (TID) | ORAL | 0 refills | Status: DC

## 2022-03-29 NOTE — Patient Instructions (Signed)
Managing Anxiety, Adult After being diagnosed with anxiety, you may be relieved to know why you have felt or behaved a certain way. You may also feel overwhelmed about the treatment ahead and what it will mean for your life. With care and support, you can manage your anxiety. How to manage lifestyle changes Understanding the difference between stress and anxiety Although stress can play a role in anxiety, it is not the same as anxiety. Stress is your body's reaction to life changes and events, both good and bad. Stress is often caused by something external, such as a deadline, test, or competition. It normally goes away after the event has ended and will last just a few hours. But, stress can be ongoing and can lead to more than just stress. Anxiety is caused by something internal, such as imagining a terrible outcome or worrying that something will go wrong that will greatly upset you. Anxiety often does not go away even after the event is over, and it can become a long-term (chronic) worry. Lowering stress and anxiety Talk with your health care provider or a counselor to learn more about lowering anxiety and stress. They may suggest tension-reduction techniques, such as: Music. Spend time creating or listening to music that you enjoy and that inspires you. Mindfulness-based meditation. Practice being aware of your normal breaths while not trying to control your breathing. It can be done while sitting or walking. Centering prayer. Focus on a word, phrase, or sacred image that means something to you and brings you peace. Deep breathing. Expand your stomach and inhale slowly through your nose. Hold your breath for 3-5 seconds. Then breathe out slowly, letting your stomach muscles relax. Self-talk. Learn to notice and spot thought patterns that lead to anxiety reactions. Change those patterns to thoughts that feel peaceful. Muscle relaxation. Take time to tense muscles and then relax them. Choose a  tension-reduction technique that fits your lifestyle and personality. These techniques take time and practice. Set aside 5-15 minutes a day to do them. Specialized therapists can offer counseling and training in these techniques. The training to help with anxiety may be covered by some insurance plans. Other things you can do to manage stress and anxiety include: Keeping a stress diary. This can help you learn what triggers your reaction and then learn ways to manage your response. Thinking about how you react to certain situations. You may not be able to control everything, but you can control your response. Making time for activities that help you relax and not feeling guilty about spending your time in this way. Doing visual imagery. This involves imagining or creating mental pictures to help you relax. Practicing yoga. Through yoga poses, you can lower tension and relax.  Medicines Medicines for anxiety include: Antidepressant medicines. These are usually prescribed for long-term daily control. Anti-anxiety medicines. These may be added in severe cases, especially when panic attacks occur. When used together, medicines, psychotherapy, and tension-reduction techniques may be the most effective treatment. Relationships Relationships can play a big part in helping you recover. Spend more time connecting with trusted friends and family members. Think about going to couples counseling if you have a partner, taking family education classes, or going to family therapy. Therapy can help you and others better understand your anxiety. How to recognize changes in your anxiety Everyone responds differently to treatment for anxiety. Recovery from anxiety happens when symptoms lessen and stop interfering with your daily life at home or work. This may mean that you  will start to: Have better concentration and focus. Worry will interfere less in your daily thinking. Sleep better. Be less irritable. Have more  energy. Have improved memory. Try to recognize when your condition is getting worse. Contact your provider if your symptoms interfere with home or work and you feel like your condition is not improving. Follow these instructions at home: Activity Exercise. Adults should: Exercise for at least 150 minutes each week. The exercise should increase your heart rate and make you sweat (moderate-intensity exercise). Do strengthening exercises at least twice a week. Get the right amount and quality of sleep. Most adults need 7-9 hours of sleep each night. Lifestyle  Eat a healthy diet that includes plenty of vegetables, fruits, whole grains, low-fat dairy products, and lean protein. Do not eat a lot of foods that are high in fats, added sugars, or salt (sodium). Make choices that simplify your life. Do not use any products that contain nicotine or tobacco. These products include cigarettes, chewing tobacco, and vaping devices, such as e-cigarettes. If you need help quitting, ask your provider. Avoid caffeine, alcohol, and certain over-the-counter cold medicines. These may make you feel worse. Ask your pharmacist which medicines to avoid. General instructions Take over-the-counter and prescription medicines only as told by your provider. Keep all follow-up visits. This is to make sure you are managing your anxiety well or if you need more support. Where to find support You can get help and support from: Self-help groups. Online and [employer]. A trusted spiritual leader. Couples counseling. Family education classes. Family therapy. Where to find more information You may find that joining a support group helps you deal with your anxiety. The following sources can help you find counselors or support groups near you: [place]: mentalhealthamerica.net Anxiety and Depression Association of [place] (ADAA): adaa.[place] on Mental Illness (NAMI): nami.org Contact  a health care provider if: You have a hard time staying focused or finishing tasks. You spend many hours a day feeling worried about everyday life. You are very tired because you cannot stop worrying. You start to have headaches or often feel tense. You have chronic nausea or diarrhea. Get help right away if: Your heart feels like it is racing. You have shortness of breath. You have thoughts of hurting yourself or others. Get help right away if you feel like you may hurt yourself or others, or have thoughts about taking your own life. Go to your nearest emergency room or: Call 911. Call the [place] at 6065144062 or 988. This is open 24 hours a day. Text the Crisis Text Line at 570-405-6676. This information is not intended to replace advice given to you by your health care provider. Make sure you discuss any questions you have with your health care provider. Document Revised: 10/17/2021 Document Reviewed: 05/01/2020 Elsevier Patient Education  [place].

## 2022-03-29 NOTE — Assessment & Plan Note (Signed)
Deteriorated Continue fluoxetine 40 mg daily Will add buspirone 15 mg 3 times daily as needed Encouraged her to get connected with therapist Support offered

## 2022-03-29 NOTE — Progress Notes (Signed)
Subjective:    Patient ID: Tracy Medina, female    DOB: 05/04/1990, 32 y.o.   MRN: [id number]  HPI  Patient presents to clinic today for follow-up of anxiety and depression.  She does feel like her depression is controlled but her anxiety has been worse lately.  She reports she also recently got laid off from her job.  At her last visit, she was continued on Fluoxetine and her Bupropion was increased to 300 mg daily.  She reports this has caused increased palpitations and blood pressure.  She has not found a new therapist as of yet.  She denies SI/HI.  Her BP today is 128/78.  Review of Systems     Past Medical History:  Diagnosis Date   Anxiety    Arthritis    Asthma    Depression    Endometriosis     Current Outpatient Medications  Medication Sig Dispense Refill   albuterol (VENTOLIN HFA) 108 (90 Base) MCG/ACT inhaler TAKE 2 PUFFS BY MOUTH EVERY 6 HOURS AS NEEDED FOR WHEEZE OR SHORTNESS OF BREATH 8.5 each 0   buPROPion (WELLBUTRIN XL) 300 MG 24 hr tablet Take 1 tablet (300 mg total) by mouth daily. 90 tablet 1   cyclobenzaprine (FLEXERIL) 10 MG tablet Take 1 tablet (10 mg total) by mouth 2 (two) times daily as needed for muscle spasms. 20 tablet 0   eszopiclone (LUNESTA) 2 MG TABS tablet Take 1 tablet (2 mg total) by mouth at bedtime as needed for sleep. Take immediately before bedtime 30 tablet 0   FLUoxetine (PROZAC) 40 MG capsule Take 1 capsule (40 mg total) by mouth daily. 90 capsule 2   fluticasone (FLOVENT HFA) 110 MCG/ACT inhaler Inhale 1 puff into the lungs in the morning and at bedtime. 1 each 5   ipratropium-albuterol (DUONEB) 0.5-2.5 (3) MG/3ML SOLN TAKE 3 MLS BY NEBULIZATION EVERY 8 (EIGHT) HOURS AS NEEDED. 360 mL 0   No current facility-administered medications for this visit.    Allergies  Allergen Reactions   Cefaclor Hives and Rash    Family History  Adopted: Yes  Problem Relation Age of Onset   Hypertension Mother     Social History    Socioeconomic History   Marital status: Single    Spouse name: Not on file   Number of children: Not on file   Years of education: Not on file   Highest education level: Not on file  Occupational History   Not on file  Tobacco Use   Smoking status: Never   Smokeless tobacco: Never  Vaping Use   Vaping Use: Never used  Substance and Sexual Activity   Alcohol use: Yes    Alcohol/week: 2.0 standard drinks of alcohol    Types: 1 Cans of beer, 1 Shots of liquor per week    Comment: social   Drug use: Never   Sexual activity: Not Currently    Birth control/protection: Surgical    Comment: hysterectomy  Other Topics Concern   Not on file  Social History Narrative   Not on file   Social Determinants of Health   Financial Resource Strain: Not on file  Food Insecurity: Not on file  Transportation Needs: Not on file  Physical Activity: Not on file  Stress: Not on file  Social Connections: Not on file  Intimate Partner Violence: Not on file     Constitutional: Denies fever, malaise, fatigue, headache or abrupt weight changes.  Respiratory: Denies difficulty breathing, shortness of breath, cough or  sputum production.   Cardiovascular: Patient reports palpitations.  Denies chest pain, chest tightness, or swelling in the hands or feet.  Neurological: Patient reports insomnia.  Denies dizziness, difficulty with memory, difficulty with speech or problems with balance and coordination.  Psych: Patient has a history of anxiety and depression.  Denies SI/HI.  No other specific complaints in a complete review of systems (except as listed in HPI above).  Objective:   Physical Exam  BP 128/78 (BP Location: Left Arm, Patient Position: Sitting, Cuff Size: Large)   Pulse 75   Temp (!) 96.9 F (36.1 C) (Temporal)   Wt 295 lb (133.8 kg)   LMP 07/23/2021 (Approximate)   SpO2 98%   BMI 46.20 kg/m   Wt Readings from Last 3 Encounters:  03/13/22 290 lb (131.5 kg)  12/17/21 265 lb  (120.2 kg)  12/10/21 260 lb (117.9 kg)    General: Appears her stated age, obese in NAD. Cardiovascular: Normal rate and rhythm. S1,S2 noted.  No murmur, rubs or gallops noted.  Pulmonary/Chest: Normal effort and positive vesicular breath sounds. No respiratory distress. No wheezes, rales or ronchi noted.  Musculoskeletal:  No difficulty with gait.  Neurological: Alert and oriented.  Psychiatric: Mood and affect mildly flat. Behavior is normal. Judgment and thought content normal.     BMET    Component Value Date/Time   NA 135 12/17/2021 0854   K 3.9 12/17/2021 0854   CL 104 12/17/2021 0854   CO2 23 12/17/2021 0854   GLUCOSE 104 (H) 12/17/2021 0854   BUN 13 12/17/2021 0854   CREATININE 0.88 12/17/2021 0854   CREATININE 0.84 05/30/2021 0858   CALCIUM 9.3 12/17/2021 0854   GFRNONAA >60 12/17/2021 0854    Lipid Panel     Component Value Date/Time   CHOL 153 05/30/2021 0858   TRIG 74 05/30/2021 0858   HDL 57 05/30/2021 0858   CHOLHDL 2.7 05/30/2021 0858   LDLCALC 81 05/30/2021 0858    CBC    Component Value Date/Time   WBC 17.0 (H) 12/17/2021 0854   RBC 4.91 12/17/2021 0854   HGB 14.3 12/17/2021 0854   HCT 42.9 12/17/2021 0854   PLT 383 12/17/2021 0854   MCV 87.4 12/17/2021 0854   MCH 29.1 12/17/2021 0854   MCHC 33.3 12/17/2021 0854   RDW 12.8 12/17/2021 0854   LYMPHSABS 2.4 06/10/2021 1933   MONOABS 0.7 06/10/2021 1933   EOSABS 0.2 06/10/2021 1933   BASOSABS 0.1 06/10/2021 1933    Hgb A1C Lab Results  Component Value Date   HGBA1C 5.1 05/30/2021           Assessment & Plan:     RTC in 1 month for her annual exam Webb Silversmith, NP

## 2022-04-02 ENCOUNTER — Encounter: Payer: Self-pay | Admitting: Internal Medicine

## 2022-04-05 ENCOUNTER — Other Ambulatory Visit: Payer: Self-pay | Admitting: Internal Medicine

## 2022-04-05 DIAGNOSIS — F32A Depression, unspecified: Secondary | ICD-10-CM

## 2022-04-05 NOTE — Telephone Encounter (Signed)
D/C 03/17/22.

## 2022-05-22 ENCOUNTER — Encounter: Payer: BC Managed Care – PPO | Admitting: Internal Medicine

## 2022-06-01 ENCOUNTER — Other Ambulatory Visit: Payer: Self-pay | Admitting: Internal Medicine

## 2022-06-01 ENCOUNTER — Telehealth: Payer: Self-pay | Admitting: Internal Medicine

## 2022-06-01 MED ORDER — BUSPIRONE HCL 15 MG PO TABS: 15.0000 mg | ORAL_TABLET | Freq: Three times a day (TID) | ORAL | 0 refills | Status: DC

## 2022-06-01 NOTE — Telephone Encounter (Signed)
Patient request 2 days supply- out of town Requested Prescriptions  Pending Prescriptions Disp Refills   busPIRone (BUSPAR) 15 MG tablet 270 tablet 0    Sig: Take 1 tablet (15 mg total) by mouth 3 (three) times daily.     Psychiatry: Anxiolytics/Hypnotics - Non-controlled Passed - 06/01/2022  2:07 PM      Passed - Valid encounter within last 12 months    Recent Outpatient Visits           2 months ago Anxiety and depression   [place] [facility] [place], Salvadore Oxford, NP   2 months ago Anxiety and depression   [place] [facility] [place], [place] W, NP   5 months ago Moderate persistent asthma with exacerbation   [place] [facility], Salvadore Oxford, NP   8 months ago Anxiety and depression   [facility] [place], [place], NP   11 months ago Acute left-sided low back pain with left-sided sciatica   [place] [facility] [place], Salvadore Oxford, NP       Future Appointments             In 3 weeks Lorre Munroe, NP [place] [facility]             FLUoxetine (PROZAC) 40 MG capsule 90 capsule 2    Sig: Take 1 capsule (40 mg total) by mouth daily.     Psychiatry:  Antidepressants - SSRI Passed - 06/01/2022  2:07 PM      Passed - Completed PHQ-2 or PHQ-9 in the last 360 days      Passed - Valid encounter within last 6 months    Recent Outpatient Visits           2 months ago Anxiety and depression   [facility] [place], Salvadore Oxford, NP   2 months ago Anxiety and depression   [place] [facility] [place], [place] W, NP   5 months ago Moderate persistent asthma with exacerbation   [place] [facility] [place], Salvadore Oxford, NP   8 months ago Anxiety and depression   [place] [facility] [place], [place], NP   11 months ago Acute left-sided low back pain with left-sided sciatica   [place]  [facility] [place], Salvadore Oxford, NP       Future Appointments             In 3 weeks Sampson Si, Salvadore Oxford, NP [place] [facility], [facility]

## 2022-06-01 NOTE — Telephone Encounter (Signed)
Pt stated she is out of town and forgot her medications. Pt request that a 2 day supply of the busPIRone (BUSPAR) 15 MG tablet  and FLUoxetine (PROZAC) 40 MG capsule.  Pt stated she takes the busPIRone (BUSPAR) 15 MG tablet 3 times a day. Pt requests that the prescriptions be sent to  [id number] - [place], [place] - [address] Phone: [phone]  Fax: [phone]

## 2022-06-01 NOTE — Telephone Encounter (Signed)
Requested medication (s) are due for refill today - expired Rx  Requested medication (s) are on the active medication list -yes  Future visit scheduled -yes  Last refill: 12/22/20 #90 2RF  Notes to clinic: expired Rx- patient requesting 2 days supply- out of town  Requested Prescriptions  Pending Prescriptions Disp Refills   FLUoxetine (PROZAC) 40 MG capsule 90 capsule 2    Sig: Take 1 capsule (40 mg total) by mouth daily.     Psychiatry:  Antidepressants - SSRI Passed - 06/01/2022  2:07 PM      Passed - Completed PHQ-2 or PHQ-9 in the last 360 days      Passed - Valid encounter within last 6 months    Recent Outpatient Visits           2 months ago Anxiety and depression   [place] [place], Salvadore Oxford, NP   2 months ago Anxiety and depression   [place] [facility] [place], [place] W, NP   5 months ago Moderate persistent asthma with exacerbation   [place] [facility] [place], Salvadore Oxford, NP   8 months ago Anxiety and depression   [place] [facility] [place], [place], NP   11 months ago Acute left-sided low back pain with left-sided sciatica   [place] [facility] [place], Salvadore Oxford, NP       Future Appointments             In 3 weeks Lorre Munroe, NP [place] [facility], [facility]            Signed Prescriptions Disp Refills   busPIRone (BUSPAR) 15 MG tablet 6 tablet 0    Sig: Take 1 tablet (15 mg total) by mouth 3 (three) times daily.     Psychiatry: Anxiolytics/Hypnotics - Non-controlled Passed - 06/01/2022  2:07 PM      Passed - Valid encounter within last 12 months    Recent Outpatient Visits           2 months ago Anxiety and depression   [place] [facility] [place], Salvadore Oxford, NP   2 months ago Anxiety and depression   [place] [facility] [place], [place], NP   5 months ago Moderate persistent asthma with  exacerbation   [place] [facility] [place], Salvadore Oxford, NP   8 months ago Anxiety and depression   [place] [facility] [place], [place] W, NP   11 months ago Acute left-sided low back pain with left-sided sciatica   [place] [facility] [place], Salvadore Oxford, NP       Future Appointments             In 3 weeks Lorre Munroe, NP [place] [facility], [facility]               Requested Prescriptions  Pending Prescriptions Disp Refills   FLUoxetine (PROZAC) 40 MG capsule 90 capsule 2    Sig: Take 1 capsule (40 mg total) by mouth daily.     Psychiatry:  Antidepressants - SSRI Passed - 06/01/2022  2:07 PM      Passed - Completed PHQ-2 or PHQ-9 in the last 360 days      Passed - Valid encounter within last 6 months    Recent Outpatient Visits  2 months ago Anxiety and depression   [place] [facility] [place], Salvadore Oxford, NP   2 months ago Anxiety and depression   [place] [place], [place], NP   5 months ago Moderate persistent asthma with exacerbation   [place] [facility] [place], Salvadore Oxford, NP   8 months ago Anxiety and depression   [place] [place] [place], [place], NP   11 months ago Acute left-sided low back pain with left-sided sciatica   [place] [facility] [place], Salvadore Oxford, NP       Future Appointments             In 3 weeks Lorre Munroe, NP [place] [facility], [place]            Signed Prescriptions Disp Refills   busPIRone (BUSPAR) 15 MG tablet 6 tablet 0    Sig: Take 1 tablet (15 mg total) by mouth 3 (three) times daily.     Psychiatry: Anxiolytics/Hypnotics - Non-controlled Passed - 06/01/2022  2:07 PM      Passed - Valid encounter within last 12 months    Recent Outpatient Visits           2 months ago Anxiety and depression   [place] [facility] [place], Salvadore Oxford, NP   2 months ago Anxiety and depression   [place] [place], [place], NP   5 months ago Moderate persistent asthma with exacerbation   [place] [facility] [place], Salvadore Oxford, NP   8 months ago Anxiety and depression   [place] [facility] [place], [place], NP   11 months ago Acute left-sided low back pain with left-sided sciatica   [place] [facility] [place], Salvadore Oxford, NP       Future Appointments             In 3 weeks Sampson Si, Salvadore Oxford, NP [facility]

## 2022-06-01 NOTE — Telephone Encounter (Signed)
Patient request short 2 day supply. Patient is out of town and left medications at home.

## 2022-06-04 MED ORDER — FLUOXETINE HCL 40 MG PO CAPS: 40.0000 mg | ORAL_CAPSULE | Freq: Every day | ORAL | 0 refills | Status: DC

## 2022-06-14 ENCOUNTER — Encounter: Payer: BC Managed Care – PPO | Admitting: Internal Medicine

## 2022-06-22 ENCOUNTER — Encounter: Payer: BC Managed Care – PPO | Admitting: Internal Medicine

## 2022-06-24 ENCOUNTER — Other Ambulatory Visit: Payer: Self-pay | Admitting: Internal Medicine

## 2022-06-25 NOTE — Telephone Encounter (Signed)
Requested medication (s) are due for refill today: routing for review  Requested medication (s) are on the active medication list: yes  Last refill:  06/01/22  Future visit scheduled: no  Notes to clinic:  Unable to refill per protocol, Rx request was lasted refilled for a short supply, should patient continue to take? Routing for review      Requested Prescriptions  Pending Prescriptions Disp Refills   busPIRone (BUSPAR) 15 MG tablet [Pharmacy Med Name: BUSPIRONE HCL 15 MG TABLET] 270 tablet     Sig: TAKE 1 TABLET BY MOUTH 3 TIMES DAILY.     Psychiatry: Anxiolytics/Hypnotics - Non-controlled Passed - 06/24/2022  8:55 AM      Passed - Valid encounter within last 12 months    Recent Outpatient Visits           2 months ago Anxiety and depression   [place] [facility] [place], Salvadore Oxford, NP   3 months ago Anxiety and depression   [place] [facility] [place], [place], NP   6 months ago Moderate persistent asthma with exacerbation   [place] [facility] [place], Salvadore Oxford, NP   8 months ago Anxiety and depression    [facility] [place], [place], NP   12 months ago Acute left-sided low back pain with left-sided sciatica    [facility] [place], Salvadore Oxford, [place]

## 2022-07-03 ENCOUNTER — Encounter: Payer: Self-pay | Admitting: Internal Medicine

## 2022-07-05 ENCOUNTER — Encounter: Payer: Self-pay | Admitting: Internal Medicine

## 2022-07-05 ENCOUNTER — Telehealth (INDEPENDENT_AMBULATORY_CARE_PROVIDER_SITE_OTHER): Payer: 59 | Admitting: Internal Medicine

## 2022-07-05 DIAGNOSIS — M5441 Lumbago with sciatica, right side: Secondary | ICD-10-CM | POA: Diagnosis not present

## 2022-07-05 MED ORDER — CYCLOBENZAPRINE HCL 10 MG PO TABS: 10.0000 mg | ORAL_TABLET | Freq: Two times a day (BID) | ORAL | 0 refills | Status: DC | PRN

## 2022-07-05 MED ORDER — TRAMADOL HCL 50 MG PO TABS: 50.0000 mg | ORAL_TABLET | Freq: Four times a day (QID) | ORAL | 0 refills | Status: AC | PRN

## 2022-07-05 NOTE — Patient Instructions (Signed)
Back Exercises The following exercises strengthen the muscles that help to support the trunk (torso) and back. They also help to keep the lower back flexible. Doing these exercises can help to prevent or lessen existing low back pain. If you have back pain or discomfort, try doing these exercises 2-3 times each day or as told by your health care provider. As your pain improves, do them once each day, but increase the number of times that you repeat the steps for each exercise (do more repetitions). To prevent the recurrence of back pain, continue to do these exercises once each day or as told by your health care provider. Do exercises exactly as told by your health care provider and adjust them as directed. It is normal to feel mild stretching, pulling, tightness, or discomfort as you do these exercises, but you should stop right away if you feel sudden pain or your pain gets worse. Exercises Single knee to chest Repeat these steps 3-5 times for each leg: Lie on your back on a firm bed or the floor with your legs extended. 

## 2022-07-05 NOTE — Progress Notes (Signed)
Virtual Visit via Video Note  I connected with Tracy Medina on 07/05/22 at  4:00 PM EDT by a video enabled telemedicine application and verified that I am speaking with the correct person using two identifiers.  Location: Patient: At work Provider: Office  Persons participating in this video call: Nicki Reaper, NP Tracy Medina.   I discussed the limitations of evaluation and management by telemedicine and the availability of in person appointments. The patient expressed understanding and agreed to proceed.  History of Present Illness:  Patient reports back pain.  This is a chronic issue that worsened in the last 2 days.  She describes the pain as sharp pain. The pain can radiate down her right leg. She reports associated tinging in her right leg but denies numbness or weakness. The pain worsens with position changes, prolonged sitting or standing. She denies loss of bowel or bladder control.  She takes Cyclobenzaprine, Lidocaine patches and Diclofenac as prescribed as needed with some relief of symptoms.  CT of the lumbar spine from 06/2021 showed:  IMPRESSION: 1. No acute abnormality within the lumbar spine. 2. 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    Past Medical History:  Diagnosis Date   Anxiety    Arthritis    Asthma    Depression    Endometriosis     Current Outpatient Medications  Medication Sig Dispense Refill   albuterol (VENTOLIN HFA) 108 (90 Base) MCG/ACT inhaler TAKE 2 PUFFS BY MOUTH EVERY 6 HOURS AS NEEDED FOR WHEEZE OR SHORTNESS OF BREATH 8.5 each 0   busPIRone (BUSPAR) 15 MG tablet Take 1 tablet (15 mg total) by mouth 3 (three) times daily. 6 tablet 0   cyclobenzaprine (FLEXERIL) 10 MG tablet Take 1 tablet (10 mg total) by mouth 2 (two) times daily as needed for muscle spasms. 20 tablet 0   eszopiclone (LUNESTA) 2 MG TABS tablet Take 1 tablet (2 mg total) by mouth at bedtime as needed for sleep. Take immediately before  bedtime 30 tablet 0   FLUoxetine (PROZAC) 40 MG capsule Take 1 capsule (40 mg total) by mouth daily. 90 capsule 0   fluticasone (FLOVENT HFA) 110 MCG/ACT inhaler Inhale 1 puff into the lungs in the morning and at bedtime. 1 each 5   ipratropium-albuterol (DUONEB) 0.5-2.5 (3) MG/3ML SOLN TAKE 3 MLS BY NEBULIZATION EVERY 8 (EIGHT) HOURS AS NEEDED. 360 mL 0   No current facility-administered medications for this visit.    Allergies  Allergen Reactions   Cefaclor Hives and Rash    Family History  Adopted: Yes  Problem Relation Age of Onset   Hypertension Mother     Social History   Socioeconomic History   Marital status: Single    Spouse name: Not on file   Number of children: Not on file   Years of education: Not on file   Highest education level: Not on file  Occupational History   Not on file  Tobacco Use   Smoking status: Never   Smokeless tobacco: Never  Vaping Use   Vaping Use: Never used  Substance and Sexual Activity   Alcohol use: Yes    Alcohol/week: 2.0 standard drinks of alcohol    Types: 1 Cans of beer, 1 Shots of liquor per week    Comment: social   Drug use: Never   Sexual activity: Not Currently    Birth control/protection: Surgical    Comment: hysterectomy  Other Topics Concern   Not on file  Social History  Narrative   Not on file   Social Determinants of Health   Financial Resource Strain: Not on file  Food Insecurity: Not on file  Transportation Needs: Not on file  Physical Activity: Not on file  Stress: Not on file  Social Connections: Not on file  Intimate Partner Violence: Not on file     Constitutional: Denies fever, malaise, fatigue, headache or abrupt weight changes.  Respiratory: Denies difficulty breathing, shortness of breath, cough or sputum production.   Cardiovascular: Denies chest pain, chest tightness, palpitations or swelling in the hands or feet.  Gastrointestinal: Denies abdominal pain, bloating, constipation, diarrhea or  blood in the stool.  GU: Denies urgency, frequency, pain with urination, burning sensation, blood in urine, odor or discharge. Musculoskeletal: Patient reports low back pain.  Denies decrease in range of motion, difficulty with gait, or joint swelling.  Skin: Denies redness, rashes, lesions or ulcercations.  Neurological: Patient reports insomnia, tingling in her right leg.  Denies dizziness, difficulty with memory, difficulty with speech or problems with balance and coordination.  Psych: Patient has a history of anxiety and depression.  Denies SI/HI.  No other specific complaints in a complete review of systems (except as listed in HPI above).  Observations/Objective:   Wt Readings from Last 3 Encounters:  03/29/22 295 lb (133.8 kg)  03/13/22 290 lb (131.5 kg)  12/17/21 265 lb (120.2 kg)    General: Appears her stated age, obese, in NAD. Pulmonary/Chest: Normal effort. No respiratory distress.  MSK: Pain with flexion, extension, rotation and lateral bending of the spine.  Gait slow and steady without device. Neurological: Alert and oriented.     BMET    Component Value Date/Time   NA 135 12/17/2021 0854   K 3.9 12/17/2021 0854   CL 104 12/17/2021 0854   CO2 23 12/17/2021 0854   GLUCOSE 104 (H) 12/17/2021 0854   BUN 13 12/17/2021 0854   CREATININE 0.88 12/17/2021 0854   CREATININE 0.84 05/30/2021 0858   CALCIUM 9.3 12/17/2021 0854   GFRNONAA >60 12/17/2021 0854    Lipid Panel     Component Value Date/Time   CHOL 153 05/30/2021 0858   TRIG 74 05/30/2021 0858   HDL 57 05/30/2021 0858   CHOLHDL 2.7 05/30/2021 0858   LDLCALC 81 05/30/2021 0858    CBC    Component Value Date/Time   WBC 17.0 (H) 12/17/2021 0854   RBC 4.91 12/17/2021 0854   HGB 14.3 12/17/2021 0854   HCT 42.9 12/17/2021 0854   PLT 383 12/17/2021 0854   MCV 87.4 12/17/2021 0854   MCH 29.1 12/17/2021 0854   MCHC 33.3 12/17/2021 0854   RDW 12.8 12/17/2021 0854   LYMPHSABS 2.4 06/10/2021 1933    MONOABS 0.7 06/10/2021 1933   EOSABS 0.2 06/10/2021 1933   BASOSABS 0.1 06/10/2021 1933    Hgb A1C Lab Results  Component Value Date   HGBA1C 5.1 05/30/2021        Assessment and Plan:  Acute on Chronic Low Back Pain:  Continue Diclofenac, Cyclobenzaprine and Lidocaine patches She declines Rx for Pred taper at this time Rx for Tramadol 50 mg every 6 hours as needed Advised her to alternate heat and ice She is already been doing back exercises at home with some relief of symptoms  Schedule an appointment for your annual exam  Follow Up Instructions:    I discussed the assessment and treatment plan with the patient. The patient was provided an opportunity to ask questions and all were  answered. The patient agreed with the plan and demonstrated an understanding of the instructions.   The patient was advised to call back or seek an in-person evaluation if the symptoms worsen or if the condition fails to improve as anticipated.   Nicki Reaper, NP

## 2022-07-16 DIAGNOSIS — M9902 Segmental and somatic dysfunction of thoracic region: Secondary | ICD-10-CM | POA: Diagnosis not present

## 2022-07-16 DIAGNOSIS — M546 Pain in thoracic spine: Secondary | ICD-10-CM | POA: Diagnosis not present

## 2022-07-16 DIAGNOSIS — M9906 Segmental and somatic dysfunction of lower extremity: Secondary | ICD-10-CM | POA: Diagnosis not present

## 2022-07-16 DIAGNOSIS — M9901 Segmental and somatic dysfunction of cervical region: Secondary | ICD-10-CM | POA: Diagnosis not present

## 2022-07-16 DIAGNOSIS — M9903 Segmental and somatic dysfunction of lumbar region: Secondary | ICD-10-CM | POA: Diagnosis not present

## 2022-07-16 DIAGNOSIS — M542 Cervicalgia: Secondary | ICD-10-CM | POA: Diagnosis not present

## 2022-07-17 DIAGNOSIS — M546 Pain in thoracic spine: Secondary | ICD-10-CM | POA: Diagnosis not present

## 2022-07-17 DIAGNOSIS — M9902 Segmental and somatic dysfunction of thoracic region: Secondary | ICD-10-CM | POA: Diagnosis not present

## 2022-07-17 DIAGNOSIS — M9901 Segmental and somatic dysfunction of cervical region: Secondary | ICD-10-CM | POA: Diagnosis not present

## 2022-07-17 DIAGNOSIS — M542 Cervicalgia: Secondary | ICD-10-CM | POA: Diagnosis not present

## 2022-07-17 DIAGNOSIS — M9906 Segmental and somatic dysfunction of lower extremity: Secondary | ICD-10-CM | POA: Diagnosis not present

## 2022-07-17 DIAGNOSIS — M9903 Segmental and somatic dysfunction of lumbar region: Secondary | ICD-10-CM | POA: Diagnosis not present

## 2022-07-18 ENCOUNTER — Encounter: Payer: Self-pay | Admitting: Internal Medicine

## 2022-07-18 ENCOUNTER — Ambulatory Visit (INDEPENDENT_AMBULATORY_CARE_PROVIDER_SITE_OTHER): Payer: 59 | Admitting: Internal Medicine

## 2022-07-18 VITALS — BP 126/82 | HR 83 | Temp 95.7°F | Ht 67.0 in | Wt 295.0 lb

## 2022-07-18 DIAGNOSIS — R7309 Other abnormal glucose: Secondary | ICD-10-CM

## 2022-07-18 DIAGNOSIS — Z0001 Encounter for general adult medical examination with abnormal findings: Secondary | ICD-10-CM | POA: Diagnosis not present

## 2022-07-18 DIAGNOSIS — Z6841 Body Mass Index (BMI) 40.0 and over, adult: Secondary | ICD-10-CM

## 2022-07-18 LAB — CBC
MCH: 29.2 pg (ref 27.0–33.0)
MCV: 89.6 fL (ref 80.0–100.0)
RBC: 4.63 10*6/uL (ref 3.80–5.10)
WBC: 8.5 10*3/uL (ref 3.8–10.8)

## 2022-07-18 NOTE — Progress Notes (Signed)
Subjective:    Patient ID: Tracy Medina, female    DOB: 1990/09/03, 32 y.o.   MRN: [id number]  HPI  Patient presents to clinic today for her annual exam.  Flu: 09/2021 Tetanus: 05/2020 COVID: Pfizer x 3 Pap smear: hysterectomy Dentist: as needed  Diet: She does eat meat. She consumes fruits and veggies. She does eat some fried foods. She drinks mostly water with crystal light. Exercise: None  Review of Systems     Past Medical History:  Diagnosis Date   Anxiety    Arthritis    Asthma    Depression    Endometriosis     Current Outpatient Medications  Medication Sig Dispense Refill   albuterol (VENTOLIN HFA) 108 (90 Base) MCG/ACT inhaler TAKE 2 PUFFS BY MOUTH EVERY 6 HOURS AS NEEDED FOR WHEEZE OR SHORTNESS OF BREATH 8.5 each 0   busPIRone (BUSPAR) 15 MG tablet Take 1 tablet (15 mg total) by mouth 3 (three) times daily. 6 tablet 0   cyclobenzaprine (FLEXERIL) 10 MG tablet Take 1 tablet (10 mg total) by mouth 2 (two) times daily as needed for muscle spasms. 20 tablet 0   eszopiclone (LUNESTA) 2 MG TABS tablet Take 1 tablet (2 mg total) by mouth at bedtime as needed for sleep. Take immediately before bedtime 30 tablet 0   FLUoxetine (PROZAC) 40 MG capsule Take 1 capsule (40 mg total) by mouth daily. 90 capsule 0   fluticasone (FLOVENT HFA) 110 MCG/ACT inhaler Inhale 1 puff into the lungs in the morning and at bedtime. 1 each 5   ipratropium-albuterol (DUONEB) 0.5-2.5 (3) MG/3ML SOLN TAKE 3 MLS BY NEBULIZATION EVERY 8 (EIGHT) HOURS AS NEEDED. 360 mL 0   No current facility-administered medications for this visit.    Allergies  Allergen Reactions   Cefaclor Hives and Rash    Family History  Adopted: Yes  Problem Relation Age of Onset   Hypertension Mother     Social History   Socioeconomic History   Marital status: Single    Spouse name: Not on file   Number of children: Not on file   Years of education: Not on file   Highest education level: Not on file   Occupational History   Not on file  Tobacco Use   Smoking status: Never   Smokeless tobacco: Never  Vaping Use   Vaping Use: Never used  Substance and Sexual Activity   Alcohol use: Yes    Alcohol/week: 2.0 standard drinks of alcohol    Types: 1 Cans of beer, 1 Shots of liquor per week    Comment: social   Drug use: Never   Sexual activity: Not Currently    Birth control/protection: Surgical    Comment: hysterectomy  Other Topics Concern   Not on file  Social History Narrative   Not on file   Social Determinants of Health   Financial Resource Strain: Not on file  Food Insecurity: Not on file  Transportation Needs: Not on file  Physical Activity: Not on file  Stress: Not on file  Social Connections: Not on file  Intimate Partner Violence: Not on file     Constitutional: Denies fever, malaise, fatigue, headache or abrupt weight changes.  HEENT: Denies eye pain, eye redness, ear pain, ringing in the ears, wax buildup, runny nose, nasal congestion, bloody nose, or sore throat. Respiratory: Denies difficulty breathing, shortness of breath, cough or sputum production.   Cardiovascular: Denies chest pain, chest tightness, palpitations or swelling in the hands or  feet.  Gastrointestinal: Denies abdominal pain, bloating, constipation, diarrhea or blood in the stool.  GU: Denies urgency, frequency, pain with urination, burning sensation, blood in urine, odor or discharge. Musculoskeletal: Pt reports chronic back pain. Denies decrease in range of motion, difficulty with gait, or joint swelling.  Skin: Denies redness, rashes, lesions or ulcercations.  Neurological: Patient reports insomnia.  Denies dizziness, difficulty with memory, difficulty with speech or problems with balance and coordination.  Psych: Patient has a history of anxiety and depression.  Denies SI/HI.  No other specific complaints in a complete review of systems (except as listed in HPI above).  Objective:    Physical Exam  BP 126/82 (BP Location: Left Arm, Patient Position: Sitting, Cuff Size: Large)   Pulse 83   Temp (!) 95.7 F (35.4 C) (Temporal)   Ht 5\' 7"  (1.702 m)   Wt 295 lb (133.8 kg)   LMP 07/23/2021 (Approximate)   SpO2 97%   BMI 46.20 kg/m   Wt Readings from Last 3 Encounters:  03/29/22 295 lb (133.8 kg)  03/13/22 290 lb (131.5 kg)  12/17/21 265 lb (120.2 kg)    General: Appears her stated age, obese in NAD. Skin: Warm, dry and intact.  HEENT: Head: normal shape and size; Eyes: sclera white, no icterus, conjunctiva pink, PERRLA and EOMs intact;  Neck:  Neck supple, trachea midline. No masses, lumps or thyromegaly present.  Cardiovascular: Normal rate and rhythm. S1,S2 noted.  No murmur, rubs or gallops noted. No JVD or BLE edema. Pulmonary/Chest: Normal effort and positive vesicular breath sounds. No respiratory distress. No wheezes, rales or ronchi noted.  Abdomen: Normal bowel sounds.  Musculoskeletal: She stands for the exam because her back hurts so bad.  Strength 5/5 BUE.  Bony tenderness noted over the lumbar spine.  Able to stand on tiptoes and heels.  No difficulty with gait.  Neurological: Alert and oriented. Cranial nerves II-XII grossly intact. Coordination normal.  Psychiatric: Mood and affect normal. Behavior is normal. Judgment and thought content normal.     BMET    Component Value Date/Time   NA 135 12/17/2021 0854   K 3.9 12/17/2021 0854   CL 104 12/17/2021 0854   CO2 23 12/17/2021 0854   GLUCOSE 104 (H) 12/17/2021 0854   BUN 13 12/17/2021 0854   CREATININE 0.88 12/17/2021 0854   CREATININE 0.84 05/30/2021 0858   CALCIUM 9.3 12/17/2021 0854   GFRNONAA >60 12/17/2021 0854    Lipid Panel     Component Value Date/Time   CHOL 153 05/30/2021 0858   TRIG 74 05/30/2021 0858   HDL 57 05/30/2021 0858   CHOLHDL 2.7 05/30/2021 0858   LDLCALC 81 05/30/2021 0858    CBC    Component Value Date/Time   WBC 17.0 (H) 12/17/2021 0854   RBC 4.91  12/17/2021 0854   HGB 14.3 12/17/2021 0854   HCT 42.9 12/17/2021 0854   PLT 383 12/17/2021 0854   MCV 87.4 12/17/2021 0854   MCH 29.1 12/17/2021 0854   MCHC 33.3 12/17/2021 0854   RDW 12.8 12/17/2021 0854   LYMPHSABS 2.4 06/10/2021 1933   MONOABS 0.7 06/10/2021 1933   EOSABS 0.2 06/10/2021 1933   BASOSABS 0.1 06/10/2021 1933    Hgb A1C Lab Results  Component Value Date   HGBA1C 5.1 05/30/2021            Assessment & Plan:   Preventative health maintenance:  Encouraged her to get a flu shot in the fall Tetanus UTD Encouraged her  to get her COVID booster She no longer needs Pap smears Encouraged her to consume a balanced diet and exercise regimen Advised her to see a dentist annually We will check CBC, c-Met, lipid, A1c today  RTC in 6 months, follow-up chronic conditions Nicki Reaper, NP

## 2022-07-18 NOTE — Patient Instructions (Signed)

## 2022-07-18 NOTE — Assessment & Plan Note (Signed)
Encourage diet and exercise for weight loss 

## 2022-07-19 ENCOUNTER — Telehealth: Payer: Self-pay

## 2022-07-19 LAB — CBC
HCT: 41.5 % (ref 35.0–45.0)
Hemoglobin: 13.5 g/dL (ref 11.7–15.5)
MCHC: 32.5 g/dL (ref 32.0–36.0)
MPV: 9.6 fL (ref 7.5–12.5)
Platelets: 383 10*3/uL (ref 140–400)
RDW: 13 % (ref 11.0–15.0)

## 2022-07-19 LAB — LIPID PANEL
Cholesterol: 176 mg/dL (ref <200)
HDL: 54 mg/dL (ref >=50)
LDL Cholesterol (Calc): 104 mg/dL (calc) — ABNORMAL HIGH
Non-HDL Cholesterol (Calc): 122 mg/dL (calc) (ref <130)
Total CHOL/HDL Ratio: 3.3 (calc) (ref <5.0)
Triglycerides: 85 mg/dL (ref <150)

## 2022-07-19 LAB — COMPLETE METABOLIC PANEL WITH GFR
AG Ratio: 1.4 (calc) (ref 1.0–2.5)
ALT: 19 U/L (ref 6–29)
AST: 18 U/L (ref 10–30)
Albumin: 4.2 g/dL (ref 3.6–5.1)
Alkaline phosphatase (APISO): 102 U/L (ref 31–125)
BUN: 8 mg/dL (ref 7–25)
CO2: 27 mmol/L (ref 20–32)
Calcium: 9.6 mg/dL (ref 8.6–10.2)
Chloride: 103 mmol/L (ref 98–110)
Creat: 0.77 mg/dL (ref 0.50–0.97)
Globulin: 2.9 g/dL (calc) (ref 1.9–3.7)
Glucose, Bld: 92 mg/dL (ref 65–99)
Potassium: 4.7 mmol/L (ref 3.5–5.3)
Sodium: 137 mmol/L (ref 135–146)
Total Bilirubin: 0.4 mg/dL (ref 0.2–1.2)
Total Protein: 7.1 g/dL (ref 6.1–8.1)
eGFR: 105 mL/min/{1.73_m2} (ref >=60)

## 2022-07-19 LAB — HEMOGLOBIN A1C
Hgb A1c MFr Bld: 5.4 % of total Hgb (ref <5.7)
Mean Plasma Glucose: 108 mg/dL
eAG (mmol/L): 6 mmol/L

## 2022-07-19 MED ORDER — TIRZEPATIDE-WEIGHT MANAGEMENT 2.5 MG/0.5ML ~~LOC~~ SOAJ: 2.5000 mg | SUBCUTANEOUS | 0 refills | Status: DC

## 2022-07-19 NOTE — Telephone Encounter (Signed)
Will send in zepbound.  If covered, she needs to let me know when she is on her last injection because we will want to go up to 5 mg next month.

## 2022-07-19 NOTE — Telephone Encounter (Signed)
Copied from CRM [id number]. Topic: General - Other >> Jul 18, 2022  4:53 PM Turkey B wrote: Reason for CRM: pt  called in wants to see if Dr Sampson Si do a PA for [facility] or Zepbound

## 2022-07-20 DIAGNOSIS — M9902 Segmental and somatic dysfunction of thoracic region: Secondary | ICD-10-CM | POA: Diagnosis not present

## 2022-07-20 DIAGNOSIS — M9901 Segmental and somatic dysfunction of cervical region: Secondary | ICD-10-CM | POA: Diagnosis not present

## 2022-07-20 DIAGNOSIS — M9903 Segmental and somatic dysfunction of lumbar region: Secondary | ICD-10-CM | POA: Diagnosis not present

## 2022-07-20 DIAGNOSIS — M9906 Segmental and somatic dysfunction of lower extremity: Secondary | ICD-10-CM | POA: Diagnosis not present

## 2022-07-20 DIAGNOSIS — M542 Cervicalgia: Secondary | ICD-10-CM | POA: Diagnosis not present

## 2022-07-20 DIAGNOSIS — M546 Pain in thoracic spine: Secondary | ICD-10-CM | POA: Diagnosis not present

## 2022-07-23 ENCOUNTER — Encounter: Payer: Self-pay | Admitting: Internal Medicine

## 2022-07-23 DIAGNOSIS — M9906 Segmental and somatic dysfunction of lower extremity: Secondary | ICD-10-CM | POA: Diagnosis not present

## 2022-07-23 DIAGNOSIS — M546 Pain in thoracic spine: Secondary | ICD-10-CM | POA: Diagnosis not present

## 2022-07-23 DIAGNOSIS — M9902 Segmental and somatic dysfunction of thoracic region: Secondary | ICD-10-CM | POA: Diagnosis not present

## 2022-07-23 DIAGNOSIS — M542 Cervicalgia: Secondary | ICD-10-CM | POA: Diagnosis not present

## 2022-07-23 DIAGNOSIS — M9901 Segmental and somatic dysfunction of cervical region: Secondary | ICD-10-CM | POA: Diagnosis not present

## 2022-07-23 DIAGNOSIS — M9903 Segmental and somatic dysfunction of lumbar region: Secondary | ICD-10-CM | POA: Diagnosis not present

## 2022-07-23 NOTE — Telephone Encounter (Signed)
See My Chart Message    Thanks,   -Sumaiya Arruda  

## 2022-07-24 DIAGNOSIS — M9903 Segmental and somatic dysfunction of lumbar region: Secondary | ICD-10-CM | POA: Diagnosis not present

## 2022-07-24 DIAGNOSIS — M9906 Segmental and somatic dysfunction of lower extremity: Secondary | ICD-10-CM | POA: Diagnosis not present

## 2022-07-24 DIAGNOSIS — M9901 Segmental and somatic dysfunction of cervical region: Secondary | ICD-10-CM | POA: Diagnosis not present

## 2022-07-24 DIAGNOSIS — M9902 Segmental and somatic dysfunction of thoracic region: Secondary | ICD-10-CM | POA: Diagnosis not present

## 2022-07-24 DIAGNOSIS — M546 Pain in thoracic spine: Secondary | ICD-10-CM | POA: Diagnosis not present

## 2022-07-24 DIAGNOSIS — M542 Cervicalgia: Secondary | ICD-10-CM | POA: Diagnosis not present

## 2022-07-24 NOTE — Telephone Encounter (Signed)
PA has been submitted to Cover My Meds.   Thanks,   -Annella Prowell  

## 2022-07-28 ENCOUNTER — Other Ambulatory Visit: Payer: Self-pay | Admitting: Internal Medicine

## 2022-07-30 DIAGNOSIS — M9906 Segmental and somatic dysfunction of lower extremity: Secondary | ICD-10-CM | POA: Diagnosis not present

## 2022-07-30 DIAGNOSIS — M9902 Segmental and somatic dysfunction of thoracic region: Secondary | ICD-10-CM | POA: Diagnosis not present

## 2022-07-30 DIAGNOSIS — M9901 Segmental and somatic dysfunction of cervical region: Secondary | ICD-10-CM | POA: Diagnosis not present

## 2022-07-30 DIAGNOSIS — M9903 Segmental and somatic dysfunction of lumbar region: Secondary | ICD-10-CM | POA: Diagnosis not present

## 2022-07-30 DIAGNOSIS — M546 Pain in thoracic spine: Secondary | ICD-10-CM | POA: Diagnosis not present

## 2022-07-30 DIAGNOSIS — M542 Cervicalgia: Secondary | ICD-10-CM | POA: Diagnosis not present

## 2022-07-30 NOTE — Telephone Encounter (Signed)
Requested Prescriptions  Pending Prescriptions Disp Refills   busPIRone (BUSPAR) 15 MG tablet [Pharmacy Med Name: BUSPIRONE HCL 15 MG TABLET] 270 tablet 1    Sig: TAKE 1 TABLET BY MOUTH 3 TIMES DAILY.     Psychiatry: Anxiolytics/Hypnotics - Non-controlled Passed - 07/28/2022 11:06 AM      Passed - Valid encounter within last 12 months    Recent Outpatient Visits           1 week ago Encounter for general adult medical examination with abnormal findings   [place] [facility] [place], [place] W, NP   3 weeks ago Acute bilateral low back pain with right-sided sciatica   [place] [facility] [place], Salvadore [place], NP   4 months ago Anxiety and depression   [place] [facility] [place], Salvadore Oxford, NP   4 months ago Anxiety and depression   [place] [facility] [place], [place] W, NP   7 months ago Moderate persistent asthma with exacerbation   [place] [facility] [place], Salvadore Oxford, NP       Future Appointments             In 5 months [place], Salvadore Oxford, NP [place] [facility], [facility]

## 2022-08-02 DIAGNOSIS — M9903 Segmental and somatic dysfunction of lumbar region: Secondary | ICD-10-CM | POA: Diagnosis not present

## 2022-08-02 DIAGNOSIS — M546 Pain in thoracic spine: Secondary | ICD-10-CM | POA: Diagnosis not present

## 2022-08-02 DIAGNOSIS — M9902 Segmental and somatic dysfunction of thoracic region: Secondary | ICD-10-CM | POA: Diagnosis not present

## 2022-08-02 DIAGNOSIS — M9901 Segmental and somatic dysfunction of cervical region: Secondary | ICD-10-CM | POA: Diagnosis not present

## 2022-08-02 DIAGNOSIS — M542 Cervicalgia: Secondary | ICD-10-CM | POA: Diagnosis not present

## 2022-08-02 DIAGNOSIS — M9906 Segmental and somatic dysfunction of lower extremity: Secondary | ICD-10-CM | POA: Diagnosis not present

## 2022-08-03 DIAGNOSIS — M9902 Segmental and somatic dysfunction of thoracic region: Secondary | ICD-10-CM | POA: Diagnosis not present

## 2022-08-03 DIAGNOSIS — M546 Pain in thoracic spine: Secondary | ICD-10-CM | POA: Diagnosis not present

## 2022-08-03 DIAGNOSIS — M542 Cervicalgia: Secondary | ICD-10-CM | POA: Diagnosis not present

## 2022-08-03 DIAGNOSIS — M9906 Segmental and somatic dysfunction of lower extremity: Secondary | ICD-10-CM | POA: Diagnosis not present

## 2022-08-03 DIAGNOSIS — M9901 Segmental and somatic dysfunction of cervical region: Secondary | ICD-10-CM | POA: Diagnosis not present

## 2022-08-03 DIAGNOSIS — M9903 Segmental and somatic dysfunction of lumbar region: Secondary | ICD-10-CM | POA: Diagnosis not present

## 2022-08-10 MED ORDER — SEMAGLUTIDE-WEIGHT MANAGEMENT 0.25 MG/0.5ML ~~LOC~~ SOAJ: 0.2500 mg | SUBCUTANEOUS | 0 refills | Status: DC

## 2022-08-10 NOTE — Addendum Note (Signed)
Addended by: Lorre Munroe on: 08/10/2022 02:28 PM   Modules accepted: Orders

## 2022-08-13 DIAGNOSIS — M9901 Segmental and somatic dysfunction of cervical region: Secondary | ICD-10-CM | POA: Diagnosis not present

## 2022-08-13 DIAGNOSIS — M9903 Segmental and somatic dysfunction of lumbar region: Secondary | ICD-10-CM | POA: Diagnosis not present

## 2022-08-13 DIAGNOSIS — M9906 Segmental and somatic dysfunction of lower extremity: Secondary | ICD-10-CM | POA: Diagnosis not present

## 2022-08-13 DIAGNOSIS — M9902 Segmental and somatic dysfunction of thoracic region: Secondary | ICD-10-CM | POA: Diagnosis not present

## 2022-08-16 DIAGNOSIS — M9906 Segmental and somatic dysfunction of lower extremity: Secondary | ICD-10-CM | POA: Diagnosis not present

## 2022-08-16 DIAGNOSIS — M9903 Segmental and somatic dysfunction of lumbar region: Secondary | ICD-10-CM | POA: Diagnosis not present

## 2022-08-16 DIAGNOSIS — M9902 Segmental and somatic dysfunction of thoracic region: Secondary | ICD-10-CM | POA: Diagnosis not present

## 2022-08-16 DIAGNOSIS — M9901 Segmental and somatic dysfunction of cervical region: Secondary | ICD-10-CM | POA: Diagnosis not present

## 2022-09-17 ENCOUNTER — Encounter: Payer: Self-pay | Admitting: Internal Medicine

## 2022-09-18 ENCOUNTER — Telehealth (INDEPENDENT_AMBULATORY_CARE_PROVIDER_SITE_OTHER): Payer: 59 | Admitting: Internal Medicine

## 2022-09-18 ENCOUNTER — Ambulatory Visit: Payer: Self-pay

## 2022-09-18 ENCOUNTER — Encounter: Payer: Self-pay | Admitting: Internal Medicine

## 2022-09-18 DIAGNOSIS — J069 Acute upper respiratory infection, unspecified: Secondary | ICD-10-CM

## 2022-09-18 MED ORDER — BENZONATATE 200 MG PO CAPS: 200.0000 mg | ORAL_CAPSULE | Freq: Three times a day (TID) | ORAL | 0 refills | Status: DC | PRN

## 2022-09-18 MED ORDER — PREDNISONE 10 MG PO TABS: ORAL_TABLET | ORAL | 0 refills | Status: DC

## 2022-09-18 NOTE — Progress Notes (Signed)
Virtual Visit via Video Note  I connected with Tracy Medina on 09/18/22 at  4:00 PM EDT by a video enabled telemedicine application and verified that I am speaking with the correct person using two identifiers.  Location: Patient: Home Provider: Office  Persons participating in this video call: Nicki Reaper, NP and Tracy Medina   I discussed the limitations of evaluation and management by telemedicine and the availability of in person appointments. The patient expressed understanding and agreed to proceed.  History of Present Illness:  Patient report runny nose, nasal congestion, sore throat and cough.  This started 2 days ago. She is blowing clear mucous out of her nose. She is having difficulty swallowing. The cough is mostly nonproductive.  She denies headache, ear pain, shortness of breath, chest pain, nausea, vomiting or diarrhea.  She denies fever, chills or bodyaches.  She has tried hycodon, ibuprofen and doxycycline with minimal relief of symptoms.  She has a history of asthma managed on flovent, albuterol and duonebs.  She has not had sick contacts that she is aware of.  She has had a negative home COVID test.    Past Medical History:  Diagnosis Date   Anxiety    Arthritis    Asthma    Depression    Endometriosis     Current Outpatient Medications  Medication Sig Dispense Refill   albuterol (VENTOLIN HFA) 108 (90 Base) MCG/ACT inhaler TAKE 2 PUFFS BY MOUTH EVERY 6 HOURS AS NEEDED FOR WHEEZE OR SHORTNESS OF BREATH 8.5 each 0   busPIRone (BUSPAR) 15 MG tablet TAKE 1 TABLET BY MOUTH 3 TIMES DAILY. 270 tablet 3   cyclobenzaprine (FLEXERIL) 10 MG tablet Take 1 tablet (10 mg total) by mouth 2 (two) times daily as needed for muscle spasms. 20 tablet 0   eszopiclone (LUNESTA) 2 MG TABS tablet Take 1 tablet (2 mg total) by mouth at bedtime as needed for sleep. Take immediately before bedtime 30 tablet 0   FLUoxetine (PROZAC) 40 MG capsule Take 1 capsule (40 mg total) by  mouth daily. 90 capsule 0   fluticasone (FLOVENT HFA) 110 MCG/ACT inhaler Inhale 1 puff into the lungs in the morning and at bedtime. 1 each 5   ipratropium-albuterol (DUONEB) 0.5-2.5 (3) MG/3ML SOLN TAKE 3 MLS BY NEBULIZATION EVERY 8 (EIGHT) HOURS AS NEEDED. 360 mL 0   Semaglutide-Weight Management 0.25 MG/0.5ML SOAJ Inject 0.25 mg into the skin once a week. 2 mL 0   No current facility-administered medications for this visit.    Allergies  Allergen Reactions   Cefaclor Hives and Rash    Family History  Adopted: Yes  Problem Relation Age of Onset   Hypertension Mother     Social History   Socioeconomic History   Marital status: Single    Spouse name: Not on file   Number of children: Not on file   Years of education: Not on file   Highest education level: Not on file  Occupational History   Not on file  Tobacco Use   Smoking status: Never   Smokeless tobacco: Never  Vaping Use   Vaping status: Never Used  Substance and Sexual Activity   Alcohol use: Yes    Alcohol/week: 2.0 standard drinks of alcohol    Types: 1 Cans of beer, 1 Shots of liquor per week    Comment: social   Drug use: Never   Sexual activity: Not Currently    Birth control/protection: Surgical    Comment: hysterectomy  Other Topics  Concern   Not on file  Social History Narrative   Not on file   Social Determinants of Health   Financial Resource Strain: Medium Risk (05/25/2020)   Received from [employer], [employer]    Financial Concerns: 2  Food Insecurity: Not on file  Transportation Needs: Unknown (02/26/2019)   Received from [employer], [employer] Needs    Lack of Transportation (Medical): 0  Physical Activity: Unknown (02/26/2019)   Received from [employer], [employer]   Physical Activity    Days per week engaged in  moderate to strenuous exercise:: Not on file    Average minutes engaged in exercise at this level:: Not on file    Calculated Minutes of Exercise per Week:: 0  Stress: Unknown (02/26/2019)   Received from [employer], [employer]   Stress    Patient Reported Major Stressor(s): 0    Patient Reported Strengths: 1    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  HEENT: Patient reports runny nose, nasal congestion and sore throat.  Denies eye pain, eye redness, ear pain, ringing in the ears, wax buildup, bloody nose. Respiratory: Patient reports cough.  Denies difficulty breathing, shortness of breath, or sputum production.   Cardiovascular: Denies chest pain, chest tightness, palpitations or swelling in the hands or feet.  Gastrointestinal: Denies abdominal pain, bloating, constipation, diarrhea or blood in the stool.    No other specific complaints in a complete review of systems (except as listed in HPI above).  Observations/Objective:   Wt Readings from Last 3 Encounters:  07/18/22 295 lb (133.8 kg)  03/29/22 295 lb (133.8 kg)  03/13/22 290 lb (131.5 kg)    General: Appears her stated age, appears unwell but in NAD. HEENT: Head: normal shape and size, no sinus tenderness reported; Nose: Congestion noted; Throat/Mouth: Hoarseness is noted.  Pulmonary/Chest: Normal effort.  Dry cough noted. No respiratory distress.   Neurological: Alert and oriented.    BMET    Component Value Date/Time   NA 137 07/18/2022 0825   K 4.7 07/18/2022 0825   CL 103 07/18/2022 0825   CO2 27 07/18/2022 0825   GLUCOSE 92 07/18/2022 0825   BUN 8 07/18/2022 0825   CREATININE 0.77 07/18/2022 0825   CALCIUM 9.6 07/18/2022 0825   GFRNONAA >60 12/17/2021 0854    Lipid Panel     Component Value Date/Time   CHOL 176 07/18/2022 0825   TRIG 85 07/18/2022 0825   HDL 54 07/18/2022 0825   CHOLHDL 3.3 07/18/2022 0825   LDLCALC 104 (H) 07/18/2022 0825    CBC    Component Value Date/Time   WBC 8.5 07/18/2022 0825   RBC 4.63 07/18/2022 0825   HGB 13.5 07/18/2022 0825   HCT 41.5 07/18/2022 0825   PLT 383 07/18/2022 0825   MCV 89.6 07/18/2022 0825   MCH 29.2 07/18/2022 0825   MCHC 32.5 07/18/2022  0825   RDW 13.0 07/18/2022 0825   LYMPHSABS 2.4 06/10/2021 1933   MONOABS 0.7 06/10/2021 1933   EOSABS 0.2 06/10/2021 1933   BASOSABS 0.1 06/10/2021 1933    Hgb A1C Lab Results  Component Value Date   HGBA1C 5.4 07/18/2022       Assessment and Plan:  Viral URI with cough:  Encouraged rest and fluids Rx for Pred taper x 6 days for symptom management Rx for Tessalon 200 mg every 8 hours as needed Continue Hycodan as previously prescribed Advised her that this is likely not bacterial and she should probably stop the doxycycline  RTC in 4 months, follow-up chronic conditions  Follow Up Instructions:    I discussed the assessment and treatment plan with the patient. The patient was provided an opportunity to ask questions and all were answered. The patient agreed with the plan and demonstrated an understanding of the instructions.   The patient was advised to call back or seek an in-person evaluation if the symptoms worsen or if the condition fails to improve as anticipated.    Nicki Reaper, NP

## 2022-09-18 NOTE — Patient Instructions (Signed)

## 2022-09-26 ENCOUNTER — Encounter: Payer: Self-pay | Admitting: Internal Medicine

## 2022-09-26 MED ORDER — AMOXICILLIN-POT CLAVULANATE 875-125 MG PO TABS: 1.0000 | ORAL_TABLET | Freq: Two times a day (BID) | ORAL | 0 refills | Status: DC

## 2022-09-27 ENCOUNTER — Telehealth: Payer: 59 | Admitting: Internal Medicine

## 2022-10-03 ENCOUNTER — Ambulatory Visit
Admission: RE | Admit: 2022-10-03 | Discharge: 2022-10-03 | Disposition: A | Discharge status: Home / Self Care | Payer: 59 | Source: Ambulatory Visit | Attending: Internal Medicine | Admitting: Internal Medicine

## 2022-10-03 ENCOUNTER — Ambulatory Visit (INDEPENDENT_AMBULATORY_CARE_PROVIDER_SITE_OTHER): Payer: 59

## 2022-10-03 ENCOUNTER — Other Ambulatory Visit: Payer: Self-pay

## 2022-10-03 VITALS — BP 141/90 | HR 89 | Temp 98.6°F | Resp 20

## 2022-10-03 DIAGNOSIS — J209 Acute bronchitis, unspecified: Secondary | ICD-10-CM

## 2022-10-03 DIAGNOSIS — R059 Cough, unspecified: Secondary | ICD-10-CM | POA: Diagnosis not present

## 2022-10-03 DIAGNOSIS — J4541 Moderate persistent asthma with (acute) exacerbation: Secondary | ICD-10-CM | POA: Diagnosis not present

## 2022-10-03 MED ORDER — GUAIFENESIN-CODEINE 100-10 MG/5ML PO SYRP
5.0000 mL | ORAL_SOLUTION | Freq: Three times a day (TID) | ORAL | 0 refills | Status: DC | PRN
Start: 2022-10-03 — End: 2022-12-24

## 2022-10-03 MED ORDER — METHYLPREDNISOLONE ACETATE 80 MG/ML IJ SUSP
40.0000 mg | Freq: Once | INTRAMUSCULAR | Status: AC
  Administered 2022-10-03: 40 mg via INTRAMUSCULAR

## 2022-10-03 MED ORDER — AZITHROMYCIN 250 MG PO TABS
250.0000 mg | ORAL_TABLET | Freq: Every day | ORAL | 0 refills | Status: DC
Start: 2022-10-03 — End: 2022-12-12

## 2022-10-03 MED ORDER — PREDNISONE 10 MG (21) PO TBPK
ORAL_TABLET | Freq: Every day | ORAL | 0 refills | Status: DC
Start: 2022-10-04 — End: 2022-12-12

## 2022-10-03 NOTE — Discharge Instructions (Signed)
Continue Augmentin and start Zithromax.  Start prednisone taper tomorrow as you are given a steroid shot in the clinic today.  Robitussin AC as needed for cough.  Please note this medication contains codeine and can make you drowsy.  Do not drink alcohol or drive on this medication.  Continue your nebulizers and inhaler as needed.  Follow-up with your PCP in 2 days for recheck.  Please go to the ER for any worsening symptoms.  I hope you feel better soon!

## 2022-10-03 NOTE — ED Triage Notes (Signed)
Pt is here with an inpatient xray ordered by her PCP, pt has had a cough since 09/18/2022 and has taken OTC meds to relieve discomfort.

## 2022-10-03 NOTE — ED Provider Notes (Signed)
[facility]    CSN: [id number] Arrival date & time: 10/03/22  1844      History   Chief Complaint Chief Complaint  Patient presents with   Cough    Have been sick for about 3 weeks I've been on prednisone and on amoxicillin that my doctor had prescribed me as well as taking delsym 12 hour and doing my nebulizer 3 times a day she told me to go to urgent care and get a chest x ray. - Entered by patient    HPI Tracy Medina is a 32 y.o. female presents for cough.  Patient reports 3 weeks of a persistent cough with shortness of breath and wheezing.  She does have a history of asthma.  Was seen by her PCP at the end of August for this and was given a prednisone taper and benzonatate.  Symptoms did not improve so she was started on Augmentin.  Patient states symptoms are still not improving and she continues to cough.  No fevers, sore throat, body aches.  She has 2 days left of her Augmentin.  She has been doing her home nebulizers and albuterol inhaler often.  No other concerns at this time.   Cough Associated symptoms: shortness of breath and wheezing     Past Medical History:  Diagnosis Date   Anxiety    Arthritis    Asthma    Depression    Endometriosis     Patient Active Problem List   Diagnosis Date Noted   Class 3 severe obesity due to excess calories with body mass index (BMI) of 45.0 to 49.9 in adult [facility]) 05/30/2021   Insomnia 05/24/2020   Iron deficiency 02/29/2020   Anxiety and depression 02/29/2020   Asthma 02/29/2020   OA (osteoarthritis of the spine) 02/29/2020    Past Surgical History:  Procedure Laterality Date   ABDOMINAL HYSTERECTOMY     CHOLECYSTECTOMY     LAPAROSCOPY     TONSILLECTOMY AND ADENOIDECTOMY     TOTAL LAPAROSCOPIC HYSTERECTOMY WITH SALPINGECTOMY Bilateral 08/21/2021   Procedure: TOTAL LAPAROSCOPIC HYSTERECTOMY WITH SALPINGECTOMY;  Surgeon: Linzie Collin, MD;  Location: [facility] ORS;  Service: Gynecology;  Laterality:  Bilateral;   TYMPANOSTOMY TUBE PLACEMENT      OB History     Gravida  0   Para  0   Term  0   Preterm  0   AB  0   Living  0      SAB  0   IAB  0   Ectopic  0   Multiple  0   Live Births  0            Home Medications    Prior to Admission medications   Medication Sig Start Date End Date Taking? Authorizing Provider  azithromycin (ZITHROMAX) 250 MG tablet Take 1 tablet (250 mg total) by mouth daily. Take first 2 tablets together, then 1 every day until finished. 10/03/22  Yes Radford Pax, NP  guaiFENesin-codeine (ROBITUSSIN AC) 100-10 MG/5ML syrup Take 5 mLs by mouth 3 (three) times daily as needed for cough. 10/03/22  Yes Radford Pax, NP  predniSONE (STERAPRED UNI-PAK 21 TAB) 10 MG (21) TBPK tablet Take by mouth daily. Take 6 tabs by mouth daily  for 1 day, then 5 tabs for 1 day, then 4 tabs for 1 day, then 3 tabs for 1 day, 2 tabs for 1 day, then 1 tab by mouth daily for 1 days 10/04/22  Yes  Radford Pax, NP  albuterol (VENTOLIN HFA) 108 (90 Base) MCG/ACT inhaler TAKE 2 PUFFS BY MOUTH EVERY 6 HOURS AS NEEDED FOR WHEEZE OR SHORTNESS OF BREATH 11/25/20   Lorre Munroe, NP  amoxicillin-clavulanate (AUGMENTIN) 875-125 MG tablet Take 1 tablet by mouth 2 (two) times daily. 09/26/22   Lorre Munroe, NP  benzonatate (TESSALON) 200 MG capsule Take 1 capsule (200 mg total) by mouth 3 (three) times daily as needed. 09/18/22   Lorre Munroe, NP  busPIRone (BUSPAR) 15 MG tablet TAKE 1 TABLET BY MOUTH 3 TIMES DAILY. 07/30/22   Lorre Munroe, NP  cyclobenzaprine (FLEXERIL) 10 MG tablet Take 1 tablet (10 mg total) by mouth 2 (two) times daily as needed for muscle spasms. 07/05/22   Lorre Munroe, NP  eszopiclone (LUNESTA) 2 MG TABS tablet Take 1 tablet (2 mg total) by mouth at bedtime as needed for sleep. Take immediately before bedtime 09/29/21   Lorre Munroe, NP  FLUoxetine (PROZAC) 40 MG capsule Take 1 capsule (40 mg total) by mouth daily. 06/04/22   Lorre Munroe, NP   fluticasone (FLOVENT HFA) 110 MCG/ACT inhaler Inhale 1 puff into the lungs in the morning and at bedtime. 03/21/22   Lorre Munroe, NP  ipratropium-albuterol (DUONEB) 0.5-2.5 (3) MG/3ML SOLN TAKE 3 MLS BY NEBULIZATION EVERY 8 (EIGHT) HOURS AS NEEDED. 01/18/22   Lorre Munroe, NP  Semaglutide-Weight Management 0.25 MG/0.5ML SOAJ Inject 0.25 mg into the skin once a week. 08/10/22   Lorre Munroe, NP    Family History Family History  Adopted: Yes  Problem Relation Age of Onset   Hypertension Mother     Social History Social History   Tobacco Use   Smoking status: Never   Smokeless tobacco: Never  Vaping Use   Vaping status: Never Used  Substance Use Topics   Alcohol use: Yes    Alcohol/week: 2.0 standard drinks of alcohol    Types: 1 Cans of beer, 1 Shots of liquor per week    Comment: social   Drug use: Never     Allergies   Cefaclor   Review of Systems Review of Systems  Respiratory:  Positive for cough, shortness of breath and wheezing.      Physical Exam Triage Vital Signs ED Triage Vitals  Encounter Vitals Group     BP 10/03/22 1931 (!) 141/90     Systolic BP Percentile --      Diastolic BP Percentile --      Pulse Rate 10/03/22 1931 89     Resp 10/03/22 1931 20     Temp 10/03/22 1931 98.6 F (37 C)     Temp Source 10/03/22 1931 Oral     SpO2 10/03/22 1931 98 %     Weight --      Height --      Head Circumference --      Peak Flow --      Pain Score 10/03/22 1929 0     Pain Loc --      Pain Education --      Exclude from Growth Chart --    No data found.  Updated Vital Signs BP (!) 141/90 (BP Location: Left Arm)   Pulse 89   Temp 98.6 F (37 C) (Oral)   Resp 20   LMP 07/23/2021 (Approximate)   SpO2 98%   Visual Acuity Right Eye Distance:   Left Eye Distance:   Bilateral Distance:    Right  Eye Near:   Left Eye Near:    Bilateral Near:     Physical Exam Vitals and nursing note reviewed.  Constitutional:      General: She is not  in acute distress.    Appearance: She is well-developed. She is not ill-appearing.  HENT:     Head: Normocephalic and atraumatic.     Right Ear: Tympanic membrane and ear canal normal.     Left Ear: Tympanic membrane and ear canal normal.     Nose: Congestion present.     Mouth/Throat:     Mouth: Mucous membranes are moist.     Pharynx: Oropharynx is clear. Uvula midline. No posterior oropharyngeal erythema.     Tonsils: No tonsillar exudate or tonsillar abscesses.  Eyes:     Conjunctiva/sclera: Conjunctivae normal.     Pupils: Pupils are equal, round, and reactive to light.  Cardiovascular:     Rate and Rhythm: Normal rate and regular rhythm.     Heart sounds: Normal heart sounds.  Pulmonary:     Effort: Pulmonary effort is normal. No respiratory distress.     Breath sounds: Normal breath sounds. No wheezing.  Musculoskeletal:     Cervical back: Normal range of motion and neck supple.  Lymphadenopathy:     Cervical: No cervical adenopathy.  Skin:    General: Skin is warm and dry.  Neurological:     General: No focal deficit present.     Mental Status: She is alert and oriented to person, place, and time.  Psychiatric:        Mood and Affect: Mood normal.        Behavior: Behavior normal.      UC Treatments / Results  Labs (all labs ordered are listed, but only abnormal results are displayed) Labs Reviewed - No data to display  EKG   Radiology DG Chest 2 View  Result Date: 10/03/2022 CLINICAL DATA:  Cough for 3 weeks EXAM: CHEST - 2 VIEW COMPARISON:  12/17/2021 FINDINGS: Mild central airways thickening. No focal opacity or pleural effusion. Normal cardiac size. No pneumothorax IMPRESSION: Mild central airways thickening which may be due to viral process or reactive airways. No focal pneumonia. Electronically Signed   By: Jasmine Pang M.D.   On: 10/03/2022 19:48    Procedures Procedures (including critical care time)  Medications Ordered in UC Medications   methylPREDNISolone acetate (DEPO-MEDROL) injection 40 mg (has no administration in time range)    Initial Impression / Assessment and Plan / UC Course  I have reviewed the triage vital signs and the nursing notes.  Pertinent labs & imaging results that were available during my care of the patient were reviewed by me and considered in my medical decision making (see chart for details).     Reviewed exam and symptoms with patient.  Reviewed chest x-ray results with patient.  No focal pneumonia.  Patient requested steroid injection in clinic for asthma, Depo-Medrol injection given.  Will start prednisone taper tomorrow, 9/12.  Will add on Zithromax as symptoms persist.  Cheratussin as needed for cough.  Side effect profile reviewed.  Patient to follow-up with PCP 2 days for recheck.  Strict ER precautions reviewed and patient verbalized understanding. Final Clinical Impressions(s) / UC Diagnoses   Final diagnoses:  Moderate persistent asthma with acute exacerbation  Acute bronchitis, unspecified organism     Discharge Instructions      Continue Augmentin and start Zithromax.  Start prednisone taper tomorrow as you are given a  steroid shot in the clinic today.  Robitussin AC as needed for cough.  Please note this medication contains codeine and can make you drowsy.  Do not drink alcohol or drive on this medication.  Continue your nebulizers and inhaler as needed.  Follow-up with your PCP in 2 days for recheck.  Please go to the ER for any worsening symptoms.  I hope you feel better soon!    ED Prescriptions     Medication Sig Dispense Auth. Provider   azithromycin (ZITHROMAX) 250 MG tablet Take 1 tablet (250 mg total) by mouth daily. Take first 2 tablets together, then 1 every day until finished. 6 tablet Radford Pax, NP   predniSONE (STERAPRED UNI-PAK 21 TAB) 10 MG (21) TBPK tablet Take by mouth daily. Take 6 tabs by mouth daily  for 1 day, then 5 tabs for 1 day, then 4 tabs for 1 day,  then 3 tabs for 1 day, 2 tabs for 1 day, then 1 tab by mouth daily for 1 days 21 tablet Radford Pax, NP   guaiFENesin-codeine (ROBITUSSIN AC) 100-10 MG/5ML syrup Take 5 mLs by mouth 3 (three) times daily as needed for cough. 118 mL Radford Pax, NP      I have reviewed the PDMP during this encounter.   Radford Pax, NP 10/03/22 1958

## 2022-12-12 ENCOUNTER — Ambulatory Visit: Payer: 59 | Admitting: Internal Medicine

## 2022-12-12 ENCOUNTER — Encounter: Payer: Self-pay | Admitting: Internal Medicine

## 2022-12-12 VITALS — BP 130/70 | HR 79 | Ht 67.0 in | Wt 301.4 lb

## 2022-12-12 DIAGNOSIS — F32A Depression, unspecified: Secondary | ICD-10-CM | POA: Diagnosis not present

## 2022-12-12 DIAGNOSIS — Z23 Encounter for immunization: Secondary | ICD-10-CM

## 2022-12-12 DIAGNOSIS — G8929 Other chronic pain: Secondary | ICD-10-CM

## 2022-12-12 DIAGNOSIS — Z6841 Body Mass Index (BMI) 40.0 and over, adult: Secondary | ICD-10-CM | POA: Diagnosis not present

## 2022-12-12 DIAGNOSIS — M545 Low back pain, unspecified: Secondary | ICD-10-CM | POA: Insufficient documentation

## 2022-12-12 DIAGNOSIS — F419 Anxiety disorder, unspecified: Secondary | ICD-10-CM | POA: Diagnosis not present

## 2022-12-12 DIAGNOSIS — E66813 Obesity, class 3: Secondary | ICD-10-CM | POA: Diagnosis not present

## 2022-12-12 NOTE — Patient Instructions (Signed)
Calorie Counting for Weight Loss Calories are units of energy. Your body needs a certain number of calories from food to keep going throughout the day. When you eat or drink more calories than your body needs, your body stores the extra calories mostly as fat. When you eat or drink fewer calories than your body needs, your body burns fat to get the energy it needs. Calorie counting means keeping track of how many calories you eat and drink each day. Calorie counting can be helpful if you need to lose weight. If you eat fewer calories than your body needs, you should lose weight. Ask your health care provider what a healthy weight is for you. For calorie counting to work, you will need to eat the right number of calories each day to lose a healthy amount of weight per week. A dietitian can help you figure out how many calories you need in a day and will suggest ways to reach your calorie goal. A healthy amount of weight to lose each week is usually 1-2 lb (0.5-0.9 kg). This usually means that your daily calorie intake should be reduced by 500-750 calories. Eating 1,200-1,500 calories a day can help most women lose weight. Eating 1,500-1,800 calories a day can help most men lose weight. What do I need to know about calorie counting? Work with your health care provider or dietitian to determine how many calories you should get each day. To meet your daily calorie goal, you will need to: Find out how many calories are in each food that you would like to eat. Try to do this before you eat. Decide how much of the food you plan to eat. Keep a food log. Do this by writing down what you ate and how many calories it had. To successfully lose weight, it is important to balance calorie counting with a healthy lifestyle that includes regular activity. Where do I find calorie information?  The number of calories in a food can be found on a Nutrition Facts label. If a food does not have a Nutrition Facts label, try  to look up the calories online or ask your dietitian for help. Remember that calories are listed per serving. If you choose to have more than one serving of a food, you will have to multiply the calories per serving by the number of servings you plan to eat. For example, the label on a package of bread might say that a serving size is 1 slice and that there are 90 calories in a serving. If you eat 1 slice, you will have eaten 90 calories. If you eat 2 slices, you will have eaten 180 calories. How do I keep a food log? After each time that you eat, record the following in your food log as soon as possible: What you ate. Be sure to include toppings, sauces, and other extras on the food. How much you ate. This can be measured in cups, ounces, or number of items. How many calories were in each food and drink. The total number of calories in the food you ate. Keep your food log near you, such as in a pocket-sized notebook or on an app or website on your mobile phone. Some programs will calculate calories for you and show you how many calories you have left to meet your daily goal. What are some portion-control tips? Know how many calories are in a serving. This will help you know how many servings you can have of a certain   food. Use a measuring cup to measure serving sizes. You could also try weighing out portions on a kitchen scale. With time, you will be able to estimate serving sizes for some foods. Take time to put servings of different foods on your favorite plates or in your favorite bowls and cups so you know what a serving looks like. Try not to eat straight from a food's packaging, such as from a bag or box. Eating straight from the package makes it hard to see how much you are eating and can lead to overeating. Put the amount you would like to eat in a cup or on a plate to make sure you are eating the right portion. Use smaller plates, glasses, and bowls for smaller portions and to prevent  overeating. Try not to multitask. For example, avoid watching TV or using your computer while eating. If it is time to eat, sit down at a table and enjoy your food. This will help you recognize when you are full. It will also help you be more mindful of what and how much you are eating. What are tips for following this plan? Reading food labels Check the calorie count compared with the serving size. The serving size may be smaller than what you are used to eating. Check the source of the calories. Try to choose foods that are high in protein, fiber, and vitamins, and low in saturated fat, trans fat, and sodium. Shopping Read nutrition labels while you shop. This will help you make healthy decisions about which foods to buy. Pay attention to nutrition labels for low-fat or fat-free foods. These foods sometimes have the same number of calories or more calories than the full-fat versions. They also often have added sugar, starch, or salt to make up for flavor that was removed with the fat. Make a grocery list of lower-calorie foods and stick to it. Cooking Try to cook your favorite foods in a healthier way. For example, try baking instead of frying. Use low-fat dairy products. Meal planning Use more fruits and vegetables. One-half of your plate should be fruits and vegetables. Include lean proteins, such as chicken, turkey, and fish. Lifestyle Each week, aim to do one of the following: 150 minutes of moderate exercise, such as walking. 75 minutes of vigorous exercise, such as running. General information Know how many calories are in the foods you eat most often. This will help you calculate calorie counts faster. Find a way of tracking calories that works for you. Get creative. Try different apps or programs if writing down calories does not work for you. What foods should I eat?  Eat nutritious foods. It is better to have a nutritious, high-calorie food, such as an avocado, than a food with  few nutrients, such as a bag of potato chips. Use your calories on foods and drinks that will fill you up and will not leave you hungry soon after eating. Examples of foods that fill you up are nuts and nut butters, vegetables, lean proteins, and high-fiber foods such as whole grains. High-fiber foods are foods with more than 5 g of fiber per serving. Pay attention to calories in drinks. Low-calorie drinks include water and unsweetened drinks. The items listed above may not be a complete list of foods and beverages you can eat. Contact a dietitian for more information. What foods should I limit? Limit foods or drinks that are not good sources of vitamins, minerals, or protein or that are high in unhealthy fats. These   include: Candy. Other sweets. Sodas, specialty coffee drinks, alcohol, and juice. The items listed above may not be a complete list of foods and beverages you should avoid. Contact a dietitian for more information. How do I count calories when eating out? Pay attention to portions. Often, portions are much larger when eating out. Try these tips to keep portions smaller: Consider sharing a meal instead of getting your own. If you get your own meal, eat only half of it. Before you start eating, ask for a container and put half of your meal into it. When available, consider ordering smaller portions from the menu instead of full portions. Pay attention to your food and drink choices. Knowing the way food is cooked and what is included with the meal can help you eat fewer calories. If calories are listed on the menu, choose the lower-calorie options. Choose dishes that include vegetables, fruits, whole grains, low-fat dairy products, and lean proteins. Choose items that are boiled, broiled, grilled, or steamed. Avoid items that are buttered, battered, fried, or served with cream sauce. Items labeled as crispy are usually fried, unless stated otherwise. Choose water, low-fat milk,  unsweetened iced tea, or other drinks without added sugar. If you want an alcoholic beverage, choose a lower-calorie option, such as a glass of wine or light beer. Ask for dressings, sauces, and syrups on the side. These are usually high in calories, so you should limit the amount you eat. If you want a salad, choose a garden salad and ask for grilled meats. Avoid extra toppings such as bacon, cheese, or fried items. Ask for the dressing on the side, or ask for olive oil and vinegar or lemon to use as dressing. Estimate how many servings of a food you are given. Knowing serving sizes will help you be aware of how much food you are eating at restaurants. Where to find more information Centers for Disease Control and Prevention: www.cdc.gov [place] Department of Agriculture: myplate.gov Summary Calorie counting means keeping track of how many calories you eat and drink each day. If you eat fewer calories than your body needs, you should lose weight. A healthy amount of weight to lose per week is usually 1-2 lb (0.5-0.9 kg). This usually means reducing your daily calorie intake by 500-750 calories. The number of calories in a food can be found on a Nutrition Facts label. If a food does not have a Nutrition Facts label, try to look up the calories online or ask your dietitian for help. Use smaller plates, glasses, and bowls for smaller portions and to prevent overeating. Use your calories on foods and drinks that will fill you up and not leave you hungry shortly after a meal. This information is not intended to replace advice given to you by your health care provider. Make sure you discuss any questions you have with your health care provider. Document Revised: 02/19/2019 Document Reviewed: 02/19/2019 Elsevier Patient Education  2023 [employer]  

## 2022-12-12 NOTE — Progress Notes (Signed)
Subjective:    Patient ID: Tracy Medina, female    DOB: 1990/06/25, 32 y.o.   MRN: [id number]  HPI  Discussed the use of AI scribe software for clinical note transcription with the patient, who gave verbal consent to proceed.   The patient presents with a primary concern of difficulty losing weight despite dietary changes and limited exercise due to back pain. She expresses frustration with her inability to access weight loss medication due to insurance restrictions, despite a high BMI and associated health issues including back pain and asthma. The patient has been researching potential solutions, including a compounding pharmacy and a Weight Watchers program that includes injections.  The patient reports a history of body dysmorphia and social anxiety related to her weight. She has been trying to manage her weight through dietary changes, including increased protein intake and reduced carbohydrate consumption, but reports no significant weight loss. The patient also mentions a past hysterectomy, after which she noticed an inability to lose weight.  The patient is currently not working due to back pain and is considering applying for Medicaid to potentially access the desired weight loss injections. She expresses a desire to avoid another surgery, such as weight loss surgery, if possible. The patient is also considering other weight loss medications, such as Contrave, and is open to exploring different programs and pharmacies to access these medications.       Review of Systems   Past Medical History:  Diagnosis Date   Anxiety    Arthritis    Asthma    Depression    Endometriosis     Current Outpatient Medications  Medication Sig Dispense Refill   albuterol (VENTOLIN HFA) 108 (90 Base) MCG/ACT inhaler TAKE 2 PUFFS BY MOUTH EVERY 6 HOURS AS NEEDED FOR WHEEZE OR SHORTNESS OF BREATH 8.5 each 0   amoxicillin-clavulanate (AUGMENTIN) 875-125 MG tablet Take 1 tablet by mouth 2 (two)  times daily. 20 tablet 0   azithromycin (ZITHROMAX) 250 MG tablet Take 1 tablet (250 mg total) by mouth daily. Take first 2 tablets together, then 1 every day until finished. 6 tablet 0   benzonatate (TESSALON) 200 MG capsule Take 1 capsule (200 mg total) by mouth 3 (three) times daily as needed. 30 capsule 0   busPIRone (BUSPAR) 15 MG tablet TAKE 1 TABLET BY MOUTH 3 TIMES DAILY. 270 tablet 3   cyclobenzaprine (FLEXERIL) 10 MG tablet Take 1 tablet (10 mg total) by mouth 2 (two) times daily as needed for muscle spasms. 20 tablet 0   eszopiclone (LUNESTA) 2 MG TABS tablet Take 1 tablet (2 mg total) by mouth at bedtime as needed for sleep. Take immediately before bedtime 30 tablet 0   FLUoxetine (PROZAC) 40 MG capsule Take 1 capsule (40 mg total) by mouth daily. 90 capsule 0   fluticasone (FLOVENT HFA) 110 MCG/ACT inhaler Inhale 1 puff into the lungs in the morning and at bedtime. 1 each 5   guaiFENesin-codeine (ROBITUSSIN AC) 100-10 MG/5ML syrup Take 5 mLs by mouth 3 (three) times daily as needed for cough. 118 mL 0   ipratropium-albuterol (DUONEB) 0.5-2.5 (3) MG/3ML SOLN TAKE 3 MLS BY NEBULIZATION EVERY 8 (EIGHT) HOURS AS NEEDED. 360 mL 0   predniSONE (STERAPRED UNI-PAK 21 TAB) 10 MG (21) TBPK tablet Take by mouth daily. Take 6 tabs by mouth daily  for 1 day, then 5 tabs for 1 day, then 4 tabs for 1 day, then 3 tabs for 1 day, 2 tabs for 1 day, then 1  tab by mouth daily for 1 days 21 tablet 0   Semaglutide-Weight Management 0.25 MG/0.5ML SOAJ Inject 0.25 mg into the skin once a week. 2 mL 0   No current facility-administered medications for this visit.    Allergies  Allergen Reactions   Cefaclor Hives and Rash    Family History  Adopted: Yes  Problem Relation Age of Onset   Hypertension Mother     Social History   Socioeconomic History   Marital status: Single    Spouse name: Not on file   Number of children: Not on file   Years of education: Not on file   Highest education level:  Not on file  Occupational History   Not on file  Tobacco Use   Smoking status: Never   Smokeless tobacco: Never  Vaping Use   Vaping status: Never Used  Substance and Sexual Activity   Alcohol use: Yes    Alcohol/week: 2.0 standard drinks of alcohol    Types: 1 Cans of beer, 1 Shots of liquor per week    Comment: social   Drug use: Never   Sexual activity: Not Currently    Birth control/protection: Surgical    Comment: hysterectomy  Other Topics Concern   Not on file  Social History Narrative   Not on file   Social Determinants of Health   Financial Resource Strain: Medium Risk (05/25/2020)   Received from [employer], [employer]    Financial Concerns: 2  Food Insecurity: Not on file  Transportation Needs: Unknown (02/26/2019)   Received from [employer] [facility], [employer] Needs    Lack of Transportation (Medical): 0  Physical Activity: Unknown (02/26/2019)   Received from [facility]   Physical Activity    Days per week engaged in moderate to strenuous exercise:: Not on file    Average minutes engaged in exercise at this level:: Not on file    Calculated Minutes of Exercise per Week:: 0  Stress: Unknown (02/26/2019)   Received from [facility]   Stress    Patient Reported Major Stressor(s): 0    Patient Reported Strengths: 1    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  Denies fever, malaise, fatigue, headache or abrupt weight changes.  HEENT: Denies eye pain, eye redness, ear pain, ringing in the ears, wax buildup, runny nose, nasal congestion, bloody nose, or sore throat. Respiratory: Denies difficulty breathing, shortness of breath, cough or sputum production.   Cardiovascular: Denies chest pain, chest tightness, palpitations or swelling in the hands or feet.  Gastrointestinal: Denies abdominal pain, bloating, constipation, diarrhea or blood in the stool.  GU: Denies urgency, frequency, pain with urination, burning sensation, blood in urine, odor or discharge. Musculoskeletal: Patient reports chronic back pain.  Denies decrease in range of motion, difficulty with gait, muscle pain or joint swelling.  Skin: Denies redness, rashes, lesions or ulcercations.  Neurological: Patient  reports insomnia.  Denies dizziness, difficulty with memory, difficulty with speech or problems with balance and coordination.  Psych: Patient has a history of anxiety and depression.  Denies SI/HI.  No other specific complaints in a complete review of systems (except as listed in HPI above).      Objective:   Physical Exam  BP 130/70   Pulse 79   Ht 5\' 7"  (1.702 m)   Wt (!) 301 lb 6.4 oz (136.7 kg)   LMP 07/23/2021 (Approximate)   SpO2 96%   BMI 47.21 kg/m   Wt Readings from Last 3 Encounters:  07/18/22 295 lb (133.8 kg)  03/29/22 295 lb (133.8 kg)  03/13/22 290 lb (131.5 kg)    General: Appears her stated age, obese, in NAD. Skin: Warm, dry and intact.  Cardiovascular: Normal rate and rhythm. o JVD or BLE edema. No carotid bruits noted. Pulmonary/Chest: Normal effort and positive vesicular breath sounds. No respiratory distress. No wheezes, rales or ronchi noted.  Musculoskeletal: No difficulty with gait.  Neurological: Alert and oriented.  Coordination normal.  Psychiatric: Mood and affect normal. Behavior is normal. Judgment and thought content normal.    BMET    Component Value Date/Time   NA 137 07/18/2022 0825   K 4.7 07/18/2022 0825   CL 103 07/18/2022 0825   CO2 27 07/18/2022 0825   GLUCOSE 92 07/18/2022 0825   BUN 8 07/18/2022 0825   CREATININE 0.77 07/18/2022 0825   CALCIUM 9.6 07/18/2022 0825   GFRNONAA >60 12/17/2021 0854    Lipid Panel     Component Value Date/Time   CHOL 176 07/18/2022 0825   TRIG 85 07/18/2022 0825   HDL 54 07/18/2022 0825   CHOLHDL 3.3 07/18/2022 0825   LDLCALC 104 (H) 07/18/2022 0825    CBC    Component Value Date/Time   WBC 8.5 07/18/2022 0825   RBC 4.63 07/18/2022 0825   HGB 13.5 07/18/2022 0825   HCT 41.5 07/18/2022 0825   PLT 383 07/18/2022 0825   MCV 89.6 07/18/2022 0825   MCH 29.2 07/18/2022 0825   MCHC 32.5 07/18/2022 0825   RDW 13.0 07/18/2022 0825   LYMPHSABS 2.4 06/10/2021 1933   MONOABS 0.7 06/10/2021 1933   EOSABS 0.2 06/10/2021 1933   BASOSABS 0.1 06/10/2021 1933    Hgb A1C Lab Results  Component Value Date   HGBA1C 5.4 07/18/2022            Assessment & Plan:    Assessment and Plan    Obesity Difficulty losing weight despite dietary changes. Discussed the challenges with insurance coverage for weight loss medications. Explored options for out-of-pocket payment for weight loss injections through a compounding pharmacy or potentially through a Weight Watchers program. Also discussed the possibility of weight loss surgery and a referral to [facility]. - Patient to research options and communicate via MyChart. - Continue dietary modifications and attempt to increase physical activity as tolerated. - Consider referral to [facility].  General Health Maintenance - Administer influenza vaccine today.       RTC in 1 month for follow-up of chronic conditions Nicki Reaper, NP

## 2022-12-24 ENCOUNTER — Encounter: Payer: Self-pay | Admitting: Internal Medicine

## 2022-12-24 ENCOUNTER — Telehealth (INDEPENDENT_AMBULATORY_CARE_PROVIDER_SITE_OTHER): Payer: 59 | Admitting: Internal Medicine

## 2022-12-24 DIAGNOSIS — J069 Acute upper respiratory infection, unspecified: Secondary | ICD-10-CM

## 2022-12-24 DIAGNOSIS — Z713 Dietary counseling and surveillance: Secondary | ICD-10-CM

## 2022-12-24 MED ORDER — PREDNISONE 10 MG PO TABS: ORAL_TABLET | ORAL | 0 refills | Status: DC

## 2022-12-24 MED ORDER — GUAIFENESIN-CODEINE 100-10 MG/5ML PO SOLN: 10.0000 mL | Freq: Three times a day (TID) | ORAL | 0 refills | Status: DC | PRN

## 2022-12-24 MED ORDER — BENZONATATE 200 MG PO CAPS: 200.0000 mg | ORAL_CAPSULE | Freq: Three times a day (TID) | ORAL | 0 refills | Status: DC | PRN

## 2022-12-24 NOTE — Progress Notes (Signed)
Virtual Visit via Video Note  I connected with Tracy Medina on 12/24/22 at 11:20 AM EST by a video enabled telemedicine application and verified that I am speaking with the correct person using two identifiers.  Location: Patient: Home Provider: Office  Persons participating in this video call: Nicki Reaper, NP and Fannie Knee.   I discussed the limitations of evaluation and management by telemedicine and the availability of in person appointments. The patient expressed understanding and agreed to proceed.  History of Present Illness:   Discussed the use of AI scribe software for clinical note transcription with the patient, who gave verbal consent to proceed.  The patient, with a history of asthma, presented a few days after the last office visit with symptoms of an upper respiratory infection, including nasal congestion and a cough. The patient reported minimal nasal discharge, which is yellow when present, and described a sensation of congestion as if everything is 'stuck up there. The cough is mostly dry, with occasional postnasal drip. She denied headache, runny nose, ear pain, sore throat, and shortness of breath. She has been using her nebulizer, albuterol, Flovent and taking Mucinex and DayQuil to alleviate symptoms.  In addition to the respiratory symptoms, the patient has been exploring weight loss options, including medication and surgery. She has researched zepbound and semaglutide, expressing a preference for zepbound due to concerns about the potential impact of wegovy on depression. She has also been considering bariatric surgery, specifically the gastric sleeve, and has begun researching local surgical options.      Past Medical History:  Diagnosis Date   Anxiety    Arthritis    Asthma    Depression    Endometriosis     Current Outpatient Medications  Medication Sig Dispense Refill   albuterol (VENTOLIN HFA) 108 (90 Base) MCG/ACT inhaler TAKE 2 PUFFS BY MOUTH  EVERY 6 HOURS AS NEEDED FOR WHEEZE OR SHORTNESS OF BREATH 8.5 each 0   benzonatate (TESSALON) 200 MG capsule Take 1 capsule (200 mg total) by mouth 3 (three) times daily as needed. 30 capsule 0   busPIRone (BUSPAR) 15 MG tablet TAKE 1 TABLET BY MOUTH 3 TIMES DAILY. 270 tablet 3   cyclobenzaprine (FLEXERIL) 10 MG tablet Take 1 tablet (10 mg total) by mouth 2 (two) times daily as needed for muscle spasms. 20 tablet 0   eszopiclone (LUNESTA) 2 MG TABS tablet Take 1 tablet (2 mg total) by mouth at bedtime as needed for sleep. Take immediately before bedtime 30 tablet 0   FLUoxetine (PROZAC) 40 MG capsule Take 1 capsule (40 mg total) by mouth daily. 90 capsule 0   fluticasone (FLOVENT HFA) 110 MCG/ACT inhaler Inhale 1 puff into the lungs in the morning and at bedtime. 1 each 5   guaiFENesin-codeine (ROBITUSSIN AC) 100-10 MG/5ML syrup Take 5 mLs by mouth 3 (three) times daily as needed for cough. 118 mL 0   ipratropium-albuterol (DUONEB) 0.5-2.5 (3) MG/3ML SOLN TAKE 3 MLS BY NEBULIZATION EVERY 8 (EIGHT) HOURS AS NEEDED. 360 mL 0   No current facility-administered medications for this visit.    Allergies  Allergen Reactions   Cefaclor Hives and Rash   Escitalopram Oxalate     Rage and  insomnia   Prednisone Other (See Comments)    Increases sx of depression   Ascorbic Acid     Family History  Adopted: Yes  Problem Relation Age of Onset   Hypertension Mother     Social History   Socioeconomic History  Marital status: Single    Spouse name: Not on file   Number of children: Not on file   Years of education: Not on file   Highest education level: Not on file  Occupational History   Not on file  Tobacco Use   Smoking status: Never   Smokeless tobacco: Never  Vaping Use   Vaping status: Never Used  Substance and Sexual Activity   Alcohol use: Yes    Alcohol/week: 2.0 standard drinks of alcohol    Types: 1 Cans of beer, 1 Shots of liquor per week    Comment: social   Drug use:  Never   Sexual activity: Not Currently    Birth control/protection: Surgical    Comment: hysterectomy  Other Topics Concern   Not on file  Social History Narrative   Not on file   Social Determinants of Health   Financial Resource Strain: Medium Risk (05/25/2020)   [facility]   Financial Resource Strain    Financial Concerns: 2  Food Insecurity: Patient Declined (12/12/2022)   Hunger Vital Sign    Worried About Running Out of Food in the Last Year: Patient declined    Ran Out of Food in the Last Year: Patient declined  Transportation Needs: Patient Declined (12/12/2022)   PRAPARE - Administrator, Civil Service (Medical): Patient declined    Lack of Transportation (Non-Medical): Patient declined  Physical Activity: Patient Declined (12/12/2022)   Exercise Vital Sign    Days of Exercise per Week: Patient declined    Minutes of Exercise per Session: Patient declined  Stress: Patient Declined (12/12/2022)   [employer] of Occupational Health - Occupational Stress Questionnaire    Feeling of Stress : Patient declined  Social Connections: Patient Declined (12/12/2022)   Social Connection and Isolation Panel [NHANES]    Frequency of Communication with Friends and Family: Patient declined    Frequency of Social Gatherings with Friends and Family: Patient declined    Attends Religious Services: Patient declined    Database administrator or Organizations: Patient declined    Attends Banker Meetings: Patient declined    Marital Status: Patient declined  Intimate Partner Violence: Patient Declined (12/12/2022)   Humiliation, Afraid, Rape, and Kick questionnaire    Fear of Current or Ex-Partner: Patient declined    Emotionally Abused: Patient declined    Physically Abused: Patient declined    Sexually Abused: Patient declined     Constitutional: Denies fever, malaise, fatigue,  headache or abrupt weight changes.  HEENT: Pt reports nasal congestion, post nasal drip. Denies eye pain, eye redness, ear pain, ringing in the ears, wax buildup, runny nose, bloody nose, or sore throat. Respiratory: Pt reports cough. Denies difficulty breathing, shortness of breath.   Cardiovascular: Denies chest pain, chest tightness, palpitations or swelling in the hands or feet.  Gastrointestinal: Denies abdominal pain, bloating, constipation, diarrhea or blood in the stool.   No other specific complaints in a complete review of systems (except as listed in HPI above).  Observations/Objective:   Wt Readings from Last 3 Encounters:  12/12/22 (!) 301 lb 6.4 oz (136.7 kg)  07/18/22 295 lb (133.8 kg)  03/29/22 295 lb (133.8 kg)    General: Appears her stated age, obese, in NAD. HEENT: Head: normal shape and size; Nose: Slight congestion noted; Throat/Mouth: Hoarseness noted Pulmonary/Chest: Normal effort. No respiratory distress.  Neurological: Alert and oriented.   BMET    Component Value Date/Time  NA 137 07/18/2022 0825   K 4.7 07/18/2022 0825   CL 103 07/18/2022 0825   CO2 27 07/18/2022 0825   GLUCOSE 92 07/18/2022 0825   BUN 8 07/18/2022 0825   CREATININE 0.77 07/18/2022 0825   CALCIUM 9.6 07/18/2022 0825   GFRNONAA >60 12/17/2021 0854    Lipid Panel     Component Value Date/Time   CHOL 176 07/18/2022 0825   TRIG 85 07/18/2022 0825   HDL 54 07/18/2022 0825   CHOLHDL 3.3 07/18/2022 0825   LDLCALC 104 (H) 07/18/2022 0825    CBC    Component Value Date/Time   WBC 8.5 07/18/2022 0825   RBC 4.63 07/18/2022 0825   HGB 13.5 07/18/2022 0825   HCT 41.5 07/18/2022 0825   PLT 383 07/18/2022 0825   MCV 89.6 07/18/2022 0825   MCH 29.2 07/18/2022 0825   MCHC 32.5 07/18/2022 0825   RDW 13.0 07/18/2022 0825   LYMPHSABS 2.4 06/10/2021 1933   MONOABS 0.7 06/10/2021 1933   EOSABS 0.2 06/10/2021 1933   BASOSABS 0.1 06/10/2021 1933    Hgb A1C Lab Results  Component  Value Date   HGBA1C 5.4 07/18/2022       Assessment and Plan:  Assessment and Plan    Viral Upper Respiratory Infection with Cough Nasal congestion and cough with minimal sputum production. No headache, runny nose, ear pain, sore throat, or shortness of breath. Asthma managed with Albuterol, Flovent, and Duoneb as needed. -Start Prednisone taper for 6 days. -Start Guaifenesin with Codeine for cough, RX sent to pharmacy. -Tessalon Perles refilled for cough.  Weight Management Discussed options for weight loss including Zepbound, [id number], and bariatric surgery. Patient expressed concern about potential impact of Wegovy on depression and cost of Zepbound. Patient considering bariatric surgery. -Recommended patient to attend online webinar about bariatric surgery at [facility]. -If considering medication, Zepbound may lead to more weight loss.       Follow Up Instructions:    I discussed the assessment and treatment plan with the patient. The patient was provided an opportunity to ask questions and all were answered. The patient agreed with the plan and demonstrated an understanding of the instructions.   The patient was advised to call back or seek an in-person evaluation if the symptoms worsen or if the condition fails to improve as anticipated.    Nicki Reaper, NP

## 2022-12-24 NOTE — Patient Instructions (Signed)

## 2023-01-08 ENCOUNTER — Encounter: Payer: Self-pay | Admitting: Internal Medicine

## 2023-01-09 MED ORDER — TIRZEPATIDE 5 MG/0.5ML ~~LOC~~ SOAJ: 5.0000 mg | SUBCUTANEOUS | 5 refills | Status: DC

## 2023-01-09 NOTE — Telephone Encounter (Signed)
Tirzepatide script sent to pharmacy and form faxed.

## 2023-01-09 NOTE — Addendum Note (Signed)
Addended by: Judd Gaudier on: 01/09/2023 11:35 AM   Modules accepted: Orders

## 2023-01-09 NOTE — Addendum Note (Signed)
Addended by: Judd Gaudier on: 01/09/2023 02:59 PM   Modules accepted: Orders

## 2023-01-22 ENCOUNTER — Ambulatory Visit: Payer: 59 | Admitting: Internal Medicine

## 2023-01-29 ENCOUNTER — Ambulatory Visit (INDEPENDENT_AMBULATORY_CARE_PROVIDER_SITE_OTHER): Payer: 59 | Admitting: Internal Medicine

## 2023-01-29 VITALS — BP 134/78 | Ht 67.0 in | Wt 290.4 lb

## 2023-01-29 DIAGNOSIS — Z6841 Body Mass Index (BMI) 40.0 and over, adult: Secondary | ICD-10-CM | POA: Diagnosis not present

## 2023-01-29 DIAGNOSIS — M545 Low back pain, unspecified: Secondary | ICD-10-CM

## 2023-01-29 DIAGNOSIS — F32A Depression, unspecified: Secondary | ICD-10-CM

## 2023-01-29 DIAGNOSIS — E66813 Obesity, class 3: Secondary | ICD-10-CM

## 2023-01-29 DIAGNOSIS — K591 Functional diarrhea: Secondary | ICD-10-CM

## 2023-01-29 DIAGNOSIS — R110 Nausea: Secondary | ICD-10-CM

## 2023-01-29 DIAGNOSIS — F5104 Psychophysiologic insomnia: Secondary | ICD-10-CM

## 2023-01-29 DIAGNOSIS — E611 Iron deficiency: Secondary | ICD-10-CM

## 2023-01-29 DIAGNOSIS — F419 Anxiety disorder, unspecified: Secondary | ICD-10-CM

## 2023-01-29 DIAGNOSIS — T887XXA Unspecified adverse effect of drug or medicament, initial encounter: Secondary | ICD-10-CM

## 2023-01-29 DIAGNOSIS — G8929 Other chronic pain: Secondary | ICD-10-CM | POA: Diagnosis not present

## 2023-01-29 DIAGNOSIS — J4530 Mild persistent asthma, uncomplicated: Secondary | ICD-10-CM | POA: Diagnosis not present

## 2023-01-29 MED ORDER — ONDANSETRON 4 MG PO TBDP: 4.0000 mg | ORAL_TABLET | Freq: Three times a day (TID) | ORAL | 0 refills | Status: DC | PRN

## 2023-01-29 NOTE — Assessment & Plan Note (Signed)
 Encourage diet and exercise for weight loss

## 2023-01-29 NOTE — Progress Notes (Signed)
 Subjective:    Patient ID: Tracy Medina, female    DOB: 1990-03-17, 33 y.o.   MRN: [id number]  HPI  Patient presents to clinic today for follow-up of chronic conditions.  Asthma: Moderate, persistent. Managed with flovent  and albuterol . PFT's from 05/2020. She does not follow with pulmonology.  Chronic low back pain: She is not currently taking any medications for this. She does not follow with orthopedics.  Anxiety and depression: Chronic, managed with fluoxetine  and buspirone . She is not currently seeing a therapist. She denies SI/HI.  Insomnia: Currently not an issue. She is not taking lunesta  at this time. There is no sleep study on file.  Iron deficiency anemia: Her last H/H was 13.5/41.5, 06/2022. She is not taking iron at this time. She does not follow with hematology.  Review of Systems     Past Medical History:  Diagnosis Date   Anxiety    Arthritis    Asthma    Depression    Endometriosis     Current Outpatient Medications  Medication Sig Dispense Refill   albuterol  (VENTOLIN  HFA) 108 (90 Base) MCG/ACT inhaler TAKE 2 PUFFS BY MOUTH EVERY 6 HOURS AS NEEDED FOR WHEEZE OR SHORTNESS OF BREATH 8.5 each 0   benzonatate  (TESSALON ) 200 MG capsule Take 1 capsule (200 mg total) by mouth 3 (three) times daily as needed. 30 capsule 0   busPIRone  (BUSPAR ) 15 MG tablet TAKE 1 TABLET BY MOUTH 3 TIMES DAILY. 270 tablet 3   cyclobenzaprine  (FLEXERIL ) 10 MG tablet Take 1 tablet (10 mg total) by mouth 2 (two) times daily as needed for muscle spasms. 20 tablet 0   eszopiclone  (LUNESTA ) 2 MG TABS tablet Take 1 tablet (2 mg total) by mouth at bedtime as needed for sleep. Take immediately before bedtime 30 tablet 0   FLUoxetine  (PROZAC ) 40 MG capsule Take 1 capsule (40 mg total) by mouth daily. 90 capsule 0   fluticasone  (FLOVENT  HFA) 110 MCG/ACT inhaler Inhale 1 puff into the lungs in the morning and at bedtime. 1 each 5   ipratropium-albuterol  (DUONEB) 0.5-2.5 (3) MG/3ML SOLN TAKE 3  MLS BY NEBULIZATION EVERY 8 (EIGHT) HOURS AS NEEDED. 360 mL 0   tirzepatide  (MOUNJARO ) 5 MG/0.5ML Pen Inject 5 mg into the skin once a week. 6 mL 5   guaiFENesin -codeine  100-10 MG/5ML syrup Take 10 mLs by mouth 3 (three) times daily as needed for cough. (Patient not taking: Reported on 01/29/2023) 120 mL 0   predniSONE  (DELTASONE ) 10 MG tablet Take 6 tabs on day 1, 5 tabs on day 2, 4 tabs on day 3, 3 tabs on day 4, 2 tabs on day 5, 1 tab on day 6 (Patient not taking: Reported on 01/29/2023) 21 tablet 0   No current facility-administered medications for this visit.    Allergies  Allergen Reactions   Cefaclor Hives and Rash   Escitalopram Oxalate     Rage and  insomnia   Prednisone  Other (See Comments)    Increases sx of depression   Ascorbic Acid     Family History  Adopted: Yes  Problem Relation Age of Onset   Hypertension Mother     Social History   Socioeconomic History   Marital status: Single    Spouse name: Not on file   Number of children: Not on file   Years of education: Not on file   Highest education level: Some college, no degree  Occupational History   Not on file  Tobacco Use   Smoking  status: Never   Smokeless tobacco: Never  Vaping Use   Vaping status: Never Used  Substance and Sexual Activity   Alcohol use: Yes    Alcohol/week: 2.0 standard drinks of alcohol    Types: 1 Cans of beer, 1 Shots of liquor per week    Comment: social   Drug use: Never   Sexual activity: Not Currently    Birth control/protection: Surgical    Comment: hysterectomy  Other Topics Concern   Not on file  Social History Narrative   Not on file   Social Drivers of Health   Financial Resource Strain: Low Risk  (01/29/2023)   Overall Financial Resource Strain (CARDIA)    Difficulty of Paying Living Expenses: Not very hard  Food Insecurity: No Food Insecurity (01/29/2023)   Hunger Vital Sign    Worried About Running Out of Food in the Last Year: Never true    Ran Out of Food in  the Last Year: Never true  Transportation Needs: No Transportation Needs (01/29/2023)   PRAPARE - Administrator, Civil Service (Medical): No    Lack of Transportation (Non-Medical): No  Physical Activity: Insufficiently Active (01/29/2023)   Exercise Vital Sign    Days of Exercise per Week: 3 days    Minutes of Exercise per Session: 20 min  Stress: No Stress Concern Present (01/29/2023)   [employer] of Occupational Health - Occupational Stress Questionnaire    Feeling of Stress : Not at all  Social Connections: Moderately Isolated (01/29/2023)   Social Connection and Isolation Panel [NHANES]    Frequency of Communication with Friends and Family: More than three times a week    Frequency of Social Gatherings with Friends and Family: Once a week    Attends Religious Services: More than 4 times per year    Active Member of [employer] or Organizations: No    Attends Banker Meetings: Patient declined    Marital Status: Never married  Intimate Partner Violence: Patient Declined (12/12/2022)   Humiliation, Afraid, Rape, and Kick questionnaire    Fear of Current or Ex-Partner: Patient declined    Emotionally Abused: Patient declined    Physically Abused: Patient declined    Sexually Abused: Patient declined     Constitutional: Denies fever, malaise, fatigue, headache or abrupt weight changes.  HEENT: Patient reports postnasal drip.  Denies eye pain, eye redness, ear pain, ringing in the ears, wax buildup, runny nose, nasal congestion, bloody nose, or sore throat. Respiratory: Denies difficulty breathing, shortness of breath, cough or sputum production.   Cardiovascular: Denies chest pain, chest tightness, palpitations or swelling in the hands or feet.  Gastrointestinal: Patient reports intermittent nausea and diarrhea.  Denies abdominal pain, bloating, constipation, or blood in the stool.  GU: Denies urgency, frequency, pain with urination, burning sensation, blood in  urine, odor or discharge. Musculoskeletal: Pt reports chronic back pain. Denies decrease in range of motion, difficulty with gait, or joint swelling.  Skin: Denies redness, rashes, lesions or ulcercations.  Neurological: Denies dizziness, difficulty with memory, difficulty with speech or problems with balance and coordination.  Psych: Patient has a history of anxiety and depression.  Denies SI/HI.  No other specific complaints in a complete review of systems (except as listed in HPI above).  Objective:   Physical Exam  BP 134/78 (BP Location: Left Arm, Patient Position: Sitting, Cuff Size: Large)   Ht 5' 7 (1.702 m)   Wt 290 lb 6.4 oz (131.7 kg)   LMP  07/23/2021 (Approximate)   BMI 45.48 kg/m   Wt Readings from Last 3 Encounters:  01/29/23 290 lb 6.4 oz (131.7 kg)  12/12/22 (!) 301 lb 6.4 oz (136.7 kg)  07/18/22 295 lb (133.8 kg)    General: Appears her stated age, obese in NAD. Skin: Warm, dry and intact.  Cardiovascular: Normal rate and rhythm. S1,S2 noted.  No murmur, rubs or gallops noted. No JVD or BLE edema. Pulmonary/Chest: Normal effort and positive vesicular breath sounds. No respiratory distress. No wheezes, rales or ronchi noted.   Musculoskeletal:  No difficulty with gait.  Neurological: Alert and oriented.  Psychiatric: Mood and affect normal. Behavior is normal. Judgment and thought content normal.     BMET    Component Value Date/Time   NA 137 07/18/2022 0825   K 4.7 07/18/2022 0825   CL 103 07/18/2022 0825   CO2 27 07/18/2022 0825   GLUCOSE 92 07/18/2022 0825   BUN 8 07/18/2022 0825   CREATININE 0.77 07/18/2022 0825   CALCIUM 9.6 07/18/2022 0825   GFRNONAA >60 12/17/2021 0854    Lipid Panel     Component Value Date/Time   CHOL 176 07/18/2022 0825   TRIG 85 07/18/2022 0825   HDL 54 07/18/2022 0825   CHOLHDL 3.3 07/18/2022 0825   LDLCALC 104 (H) 07/18/2022 0825    CBC    Component Value Date/Time   WBC 8.5 07/18/2022 0825   RBC 4.63  07/18/2022 0825   HGB 13.5 07/18/2022 0825   HCT 41.5 07/18/2022 0825   PLT 383 07/18/2022 0825   MCV 89.6 07/18/2022 0825   MCH 29.2 07/18/2022 0825   MCHC 32.5 07/18/2022 0825   RDW 13.0 07/18/2022 0825   LYMPHSABS 2.4 06/10/2021 1933   MONOABS 0.7 06/10/2021 1933   EOSABS 0.2 06/10/2021 1933   BASOSABS 0.1 06/10/2021 1933    Hgb A1C Lab Results  Component Value Date   HGBA1C 5.4 07/18/2022            Assessment & Plan:   Nausea and diarrhea secondary to medication side effect:  Rx for Zofran  4 mg every 8 hours as needed Okay to take Imodium OTC as needed  RTC in 6 months for your annual exam Angeline Laura, NP

## 2023-01-29 NOTE — Assessment & Plan Note (Signed)
 Stable on fluoxetine and buspirone Encouraged her to get connected with therapist Support offered

## 2023-01-29 NOTE — Assessment & Plan Note (Signed)
 Currently not an issue She has Lunesta to take if needed We will monitor

## 2023-01-29 NOTE — Patient Instructions (Signed)

## 2023-01-29 NOTE — Assessment & Plan Note (Signed)
 Continue Albuterol and Flovent as needed

## 2023-01-29 NOTE — Assessment & Plan Note (Signed)
 CBC reviewed

## 2023-01-29 NOTE — Assessment & Plan Note (Signed)
 Encouraged regular stretching and core strengthening Okay to take Tylenol OTC as needed

## 2023-02-07 ENCOUNTER — Encounter: Payer: Self-pay | Admitting: Internal Medicine

## 2023-02-25 DIAGNOSIS — Z6841 Body Mass Index (BMI) 40.0 and over, adult: Secondary | ICD-10-CM | POA: Diagnosis not present

## 2023-02-25 DIAGNOSIS — E66813 Obesity, class 3: Secondary | ICD-10-CM | POA: Diagnosis not present

## 2023-02-27 ENCOUNTER — Encounter: Payer: Self-pay | Admitting: Internal Medicine

## 2023-03-20 ENCOUNTER — Encounter: Payer: Self-pay | Admitting: Internal Medicine

## 2023-03-22 ENCOUNTER — Telehealth (INDEPENDENT_AMBULATORY_CARE_PROVIDER_SITE_OTHER): Payer: 59 | Admitting: Internal Medicine

## 2023-03-22 DIAGNOSIS — L02411 Cutaneous abscess of right axilla: Secondary | ICD-10-CM | POA: Diagnosis not present

## 2023-03-22 MED ORDER — SULFAMETHOXAZOLE-TRIMETHOPRIM 800-160 MG PO TABS: 1.0000 | ORAL_TABLET | Freq: Two times a day (BID) | ORAL | 0 refills | Status: AC

## 2023-03-22 MED ORDER — SULFAMETHOXAZOLE-TRIMETHOPRIM 800-160 MG PO TABS: 1.0000 | ORAL_TABLET | Freq: Two times a day (BID) | ORAL | 0 refills | Status: DC

## 2023-03-22 NOTE — Patient Instructions (Signed)
 Skin Abscess  A skin abscess is an infected area on or under your skin. It contains pus and other material. An abscess may also be called a furuncle, carbuncle, or boil. It is often the result of an infection caused by bacteria. An abscess can occur in or on almost any part of your body. Sometimes, an abscess may break open (rupture) on its own. In most cases, it will keep getting worse unless it is treated. An abscess can cause pain and make you feel ill. An untreated abscess can cause infection to spread to other parts of your body or your bloodstream. The abscess may need to be drained. You may also need to take antibiotics. What are the causes? An abscess occurs when germs, like bacteria, pass through your skin and cause an infection. This may be caused by: A scrape or cut on your skin. A puncture wound through your skin, such as a needle injection or insect bite. Blocked oil or sweat glands. Blocked and infected hair follicles. A fluid-filled sac that forms beneath your skin (sebaceous cyst) and becomes infected. What increases the risk? You may be more likely to develop an abscess if: You have problems with blood circulation, or you have a weak body defense system (immune system). You have diabetes. You have dry and irritated skin. You get injections often or use IV drugs. You have a foreign body in a wound, such as a splinter. You smoke or use tobacco products. What are the signs or symptoms? Symptoms of this condition include: A painful, firm bump under the skin. A bump with pus at the top. This may break through the skin and drain. Other symptoms include: Redness and swelling around the abscess. Warmth or tenderness. Swelling of the lymph nodes (glands) near the abscess. A sore on the skin. How is this diagnosed? This condition may be diagnosed based on a physical exam and your medical history. You may also have tests done, such as: A test of a sample of pus. This may be done  to find what is causing the infection. Blood tests. Imaging tests, such as an ultrasound, CT scan, or MRI. How is this treated? A small abscess that drains on its own may not need to be treated. Treatment for larger abscesses may include: Moist heat or a heat pack applied to the area a few times a day. Incision and drainage. This is a procedure to drain the abscess. Antibiotics. For a severe abscess, you may first get antibiotics through an IV and then change to antibiotics by mouth. Follow these instructions at home: Medicines Take over-the-counter and prescription medicines only as told by your provider. If you were prescribed antibiotics, take them as told by your provider. Do not stop using the antibiotic even if you start to feel better. Abscess care  If you have an abscess that has not drained, apply heat to the affected area. Use the heat source that your provider recommends, such as a moist heat pack or a heating pad. Place a towel between your skin and the heat source. Leave the heat on for 20-30 minutes at a time. If your skin turns bright red, remove the heat right away to prevent burns. The risk of burns is higher if you cannot feel pain, heat, or cold. Follow instructions from your provider about how to take care of your abscess. Make sure you: Cover the abscess with a bandage (dressing). Wash your hands with soap and water for at least 20 seconds before  and after you change the dressing or gauze. If soap and water are not available, use hand sanitizer. Change your dressing or gauze as told by your provider. Check your abscess every day for signs of an infection that is getting worse. Check for: More redness, swelling, pain, or tenderness. More fluid or blood. Warmth. More pus or a worse smell. General instructions To avoid spreading the infection: Do not share personal care items, towels, or hot tubs with others. Avoid making skin contact with other people. Be careful  when getting rid of used dressings, wound packing, or any drainage from the abscess. Do not use any products that contain nicotine or tobacco. These products include cigarettes, chewing tobacco, and vaping devices, such as e-cigarettes. If you need help quitting, ask your provider. Do not use any creams, ointments, or liquids unless you have been told to by your provider. Contact a health care provider if: You see redness that spreads quickly or red streaks on your skin spreading away from the abscess. You have any signs of worse infection at the abscess. You vomit every time you eat or drink. You have a fever, chills, or muscle aches. The cyst or abscess returns. Get help right away if: You have severe pain. You make less pee (urine) than normal. This information is not intended to replace advice given to you by your health care provider. Make sure you discuss any questions you have with your health care provider. Document Revised: 08/23/2021 Document Reviewed: 08/23/2021 Elsevier Patient Education  2024 [employer].

## 2023-03-22 NOTE — Progress Notes (Signed)
 Virtual Visit via Video Note  I connected with Tracy Medina on 03/22/23 at 11:40 AM EST by a video enabled telemedicine application and verified that I am speaking with the correct person using two identifiers.  Location: Patient: Home Provider: Office  Person's participating in this video call: Nicki Reaper, NP-C and Gloriann Riede I discussed the limitations of evaluation and management by telemedicine and the availability of in person appointments. The patient expressed understanding and agreed to proceed.  History of Present Illness:  Discussed the use of AI scribe software for clinical note transcription with the patient, who gave verbal consent to proceed.  Tracy Medina is a 33 year old female who presents with a painful lesion under her armpit.  She has a painful lesion under her armpit that has been present for a while. Initially thought to be an ingrown hair, the lesion has grown significantly larger, is drying up, but remains very irritated and red. She has been applying warm compresses but has not noticed any drainage.  She also notes the presence of similar lesions in the groin area. She attributes these to recent weight loss of 26 pounds. She has not experienced similar lesions in other areas such as the other armpit or under the breast.  She mentions using a reusable razor for shaving, which she has stopped using since the lesion became painful.      Past Medical History:  Diagnosis Date   Anxiety    Arthritis    Asthma    Depression    Endometriosis     Current Outpatient Medications  Medication Sig Dispense Refill   albuterol (VENTOLIN HFA) 108 (90 Base) MCG/ACT inhaler TAKE 2 PUFFS BY MOUTH EVERY 6 HOURS AS NEEDED FOR WHEEZE OR SHORTNESS OF BREATH 8.5 each 0   benzonatate (TESSALON) 200 MG capsule Take 1 capsule (200 mg total) by mouth 3 (three) times daily as needed. 30 capsule 0   busPIRone (BUSPAR) 15 MG tablet TAKE 1 TABLET BY MOUTH 3 TIMES  DAILY. 270 tablet 3   cyclobenzaprine (FLEXERIL) 10 MG tablet Take 1 tablet (10 mg total) by mouth 2 (two) times daily as needed for muscle spasms. 20 tablet 0   eszopiclone (LUNESTA) 2 MG TABS tablet Take 1 tablet (2 mg total) by mouth at bedtime as needed for sleep. Take immediately before bedtime 30 tablet 0   FLUoxetine (PROZAC) 40 MG capsule Take 1 capsule (40 mg total) by mouth daily. 90 capsule 0   fluticasone (FLOVENT HFA) 110 MCG/ACT inhaler Inhale 1 puff into the lungs in the morning and at bedtime. 1 each 5   ipratropium-albuterol (DUONEB) 0.5-2.5 (3) MG/3ML SOLN TAKE 3 MLS BY NEBULIZATION EVERY 8 (EIGHT) HOURS AS NEEDED. 360 mL 0   ondansetron (ZOFRAN-ODT) 4 MG disintegrating tablet Take 1 tablet (4 mg total) by mouth every 8 (eight) hours as needed for nausea or vomiting. 30 tablet 0   tirzepatide (MOUNJARO) 5 MG/0.5ML Pen Inject 5 mg into the skin once a week. 6 mL 5   No current facility-administered medications for this visit.    Allergies  Allergen Reactions   Cefaclor Hives and Rash   Escitalopram Oxalate     Rage and  insomnia   Prednisone Other (See Comments)    Increases sx of depression   Ascorbic Acid     Family History  Adopted: Yes  Problem Relation Age of Onset   Hypertension Mother     Social History   Socioeconomic History   Marital status:  Single    Spouse name: Not on file   Number of children: Not on file   Years of education: Not on file   Highest education level: Some college, no degree  Occupational History   Not on file  Tobacco Use   Smoking status: Never   Smokeless tobacco: Never  Vaping Use   Vaping status: Never Used  Substance and Sexual Activity   Alcohol use: Yes    Alcohol/week: 2.0 standard drinks of alcohol    Types: 1 Cans of beer, 1 Shots of liquor per week    Comment: social   Drug use: Never   Sexual activity: Not Currently    Birth control/protection: Surgical    Comment: hysterectomy  Other Topics Concern   Not  on file  Social History Narrative   Not on file   Social Drivers of Health   Financial Resource Strain: Low Risk  (01/29/2023)   Overall Financial Resource Strain (CARDIA)    Difficulty of Paying Living Expenses: Not very hard  Food Insecurity: No Food Insecurity (01/29/2023)   Hunger Vital Sign    Worried About Running Out of Food in the Last Year: Never true    Ran Out of Food in the Last Year: Never true  Transportation Needs: No Transportation Needs (01/29/2023)   PRAPARE - Administrator, Civil Service (Medical): No    Lack of Transportation (Non-Medical): No  Physical Activity: Insufficiently Active (01/29/2023)   Exercise Vital Sign    Days of Exercise per Week: 3 days    Minutes of Exercise per Session: 20 min  Stress: No Stress Concern Present (01/29/2023)   [employer] of Occupational Health - Occupational Stress Questionnaire    Feeling of Stress : Not at all  Social Connections: Moderately Isolated (01/29/2023)   Social Connection and Isolation Panel [NHANES]    Frequency of Communication with Friends and Family: More than three times a week    Frequency of Social Gatherings with Friends and Family: Once a week    Attends Religious Services: More than 4 times per year    Active Member of [employer] or Organizations: No    Attends Banker Meetings: Patient declined    Marital Status: Never married  Intimate Partner Violence: Patient Declined (12/12/2022)   Humiliation, Afraid, Rape, and Kick questionnaire    Fear of Current or Ex-Partner: Patient declined    Emotionally Abused: Patient declined    Physically Abused: Patient declined    Sexually Abused: Patient declined     Constitutional: Denies fever, malaise, fatigue, headache or abrupt weight changes.  Respiratory: Denies difficulty breathing, shortness of breath, cough or sputum production.   Cardiovascular: Denies chest pain, chest tightness, palpitations or swelling in the hands or feet.   Skin: Patient reports boil in axilla.  Denies rashes or ulcercations.   No other specific complaints in a complete review of systems (except as listed in HPI above).  Observations/Objective:  LMP 07/23/2021 (Approximate)  Wt Readings from Last 3 Encounters:  01/29/23 290 lb 6.4 oz (131.7 kg)  12/12/22 (!) 301 lb 6.4 oz (136.7 kg)  07/18/22 295 lb (133.8 kg)    General: Appears her stated age, well developed, well nourished in NAD. Skin: 1.5 cm abscess without cellulitis noted in the right axilla.  Pulmonary/Chest: Normal effort. Neurological: Alert and oriented.   BMET    Component Value Date/Time   NA 137 07/18/2022 0825   K 4.7 07/18/2022 0825   CL 103 07/18/2022  0825   CO2 27 07/18/2022 0825   GLUCOSE 92 07/18/2022 0825   BUN 8 07/18/2022 0825   CREATININE 0.77 07/18/2022 0825   CALCIUM 9.6 07/18/2022 0825   GFRNONAA >60 12/17/2021 0854    Lipid Panel     Component Value Date/Time   CHOL 176 07/18/2022 0825   TRIG 85 07/18/2022 0825   HDL 54 07/18/2022 0825   CHOLHDL 3.3 07/18/2022 0825   LDLCALC 104 (H) 07/18/2022 0825    CBC    Component Value Date/Time   WBC 8.5 07/18/2022 0825   RBC 4.63 07/18/2022 0825   HGB 13.5 07/18/2022 0825   HCT 41.5 07/18/2022 0825   PLT 383 07/18/2022 0825   MCV 89.6 07/18/2022 0825   MCH 29.2 07/18/2022 0825   MCHC 32.5 07/18/2022 0825   RDW 13.0 07/18/2022 0825   LYMPHSABS 2.4 06/10/2021 1933   MONOABS 0.7 06/10/2021 1933   EOSABS 0.2 06/10/2021 1933   BASOSABS 0.1 06/10/2021 1933    Hgb A1C Lab Results  Component Value Date   HGBA1C 5.4 07/18/2022       Assessment and Plan:   Assessment and Plan    Axillary Abscess Painful, enlarging, erythematous lesion in the axilla, likely secondary to folliculitis or an ingrown hair. No drainage with warm compresses. Possible early hidradenitis suppurativa given similar lesion in the groin area. -Start Septra DS BID for 10 days. -Continue warm compresses. -Avoid  shaving until resolution. -Clean razor with alcohol before each use.   RTC in 4 months for your annual exam  Follow Up Instructions:    I discussed the assessment and treatment plan with the patient. The patient was provided an opportunity to ask questions and all were answered. The patient agreed with the plan and demonstrated an understanding of the instructions.   The patient was advised to call back or seek an in-person evaluation if the symptoms worsen or if the condition fails to improve as anticipated.   Nicki Reaper, NP

## 2023-03-27 DIAGNOSIS — Z6841 Body Mass Index (BMI) 40.0 and over, adult: Secondary | ICD-10-CM | POA: Diagnosis not present

## 2023-03-27 DIAGNOSIS — E66813 Obesity, class 3: Secondary | ICD-10-CM | POA: Diagnosis not present

## 2023-04-04 ENCOUNTER — Encounter: Payer: Self-pay | Admitting: Internal Medicine

## 2023-04-04 ENCOUNTER — Other Ambulatory Visit: Payer: Self-pay | Admitting: Internal Medicine

## 2023-04-04 NOTE — Telephone Encounter (Signed)
 Requested medication (s) are due for refill today: Yes  Requested medication (s) are on the active medication list: Yes  Last refill:  01/09/23  Future visit scheduled: Yes  Notes to clinic:  Unable to refill due to no refill protocol for this medication.      Requested Prescriptions  Pending Prescriptions Disp Refills   tirzepatide (MOUNJARO) 5 MG/0.5ML Pen 6 mL 5    Sig: Inject 5 mg into the skin once a week.     Off-Protocol Failed - 04/04/2023  5:54 PM      Failed - Medication not assigned to a protocol, review manually.      Passed - Valid encounter within last 12 months    Recent Outpatient Visits           2 months ago Nausea   [place] [facility] [place], [place] W, NP   3 months ago Viral upper respiratory tract infection with cough   [place] [facility] [place], [place] W, NP   3 months ago Chronic midline low back pain without sciatica   [place] [facility] [place], Salvadore Oxford, NP   6 months ago Viral URI with cough   [place] [facility], Salvadore Oxford, NP   8 months ago Encounter for general adult medical examination with abnormal findings   [place] [facility] [place], Salvadore Oxford, NP       Future Appointments             In 3 months Baity, Salvadore Oxford, NP  [facility], [facility]

## 2023-04-04 NOTE — Telephone Encounter (Signed)
 Copied from CRM [id number]. Topic: Clinical - Medication Refill >> Apr 04, 2023  1:57 PM Priscille Loveless wrote: Most Recent Primary Care Visit:  Provider: Lorre Munroe  Department: [facility]  Visit Type: OFFICE VISIT  Date: 03/22/2023  Medication: tirzepatide [facility]) 5 MG/0.5ML Pen  Has the patient contacted their pharmacy? Yes  Is this the correct pharmacy for this prescription? Yes  This is the patient's preferred pharmacy:   [facility] - [place], [place] - [address] [place] [address] Phone: [phone] Fax: [phone]   Has the prescription been filled recently? Yes  Is the patient out of the medication? No  Has the patient been seen for an appointment in the last year OR does the patient have an upcoming appointment? Yes  Can we respond through MyChart? Yes  Agent: Please be advised that Rx refills may take up to 3 business days. We ask that you follow-up with your pharmacy.  Pt stated that she can not get the compound anymore after tomorrow 03/14 per her pharmacy and FDA.

## 2023-04-05 ENCOUNTER — Encounter: Payer: Self-pay | Admitting: Internal Medicine

## 2023-04-10 ENCOUNTER — Encounter: Payer: Self-pay | Admitting: Internal Medicine

## 2023-04-11 MED ORDER — CYCLOBENZAPRINE HCL 10 MG PO TABS: 10.0000 mg | ORAL_TABLET | Freq: Two times a day (BID) | ORAL | 0 refills | Status: AC | PRN

## 2023-04-11 NOTE — Addendum Note (Signed)
 Addended by: Lorre Munroe on: 04/11/2023 09:52 AM   Modules accepted: Orders

## 2023-04-25 ENCOUNTER — Encounter: Payer: Self-pay | Admitting: Internal Medicine

## 2023-05-01 DIAGNOSIS — Z6841 Body Mass Index (BMI) 40.0 and over, adult: Secondary | ICD-10-CM | POA: Diagnosis not present

## 2023-05-01 DIAGNOSIS — E66813 Obesity, class 3: Secondary | ICD-10-CM | POA: Diagnosis not present

## 2023-05-29 DIAGNOSIS — J040 Acute laryngitis: Secondary | ICD-10-CM | POA: Diagnosis not present

## 2023-05-29 DIAGNOSIS — J069 Acute upper respiratory infection, unspecified: Secondary | ICD-10-CM | POA: Diagnosis not present

## 2023-05-29 DIAGNOSIS — J45901 Unspecified asthma with (acute) exacerbation: Secondary | ICD-10-CM | POA: Diagnosis not present

## 2023-07-18 ENCOUNTER — Encounter: Payer: Self-pay | Admitting: Internal Medicine

## 2023-07-18 ENCOUNTER — Ambulatory Visit: Admitting: Internal Medicine

## 2023-07-18 VITALS — BP 130/82 | Ht 67.0 in | Wt 275.0 lb

## 2023-07-18 DIAGNOSIS — Z6841 Body Mass Index (BMI) 40.0 and over, adult: Secondary | ICD-10-CM | POA: Diagnosis not present

## 2023-07-18 DIAGNOSIS — E66813 Obesity, class 3: Secondary | ICD-10-CM

## 2023-07-18 DIAGNOSIS — Z23 Encounter for immunization: Secondary | ICD-10-CM

## 2023-07-18 DIAGNOSIS — R739 Hyperglycemia, unspecified: Secondary | ICD-10-CM | POA: Diagnosis not present

## 2023-07-18 DIAGNOSIS — Z136 Encounter for screening for cardiovascular disorders: Secondary | ICD-10-CM

## 2023-07-18 DIAGNOSIS — Z0001 Encounter for general adult medical examination with abnormal findings: Secondary | ICD-10-CM

## 2023-07-18 LAB — CBC
HCT: 42.3 % (ref 35.0–45.0)
Hemoglobin: 14 g/dL (ref 11.7–15.5)
MCH: 30 pg (ref 27.0–33.0)
MCHC: 33.1 g/dL (ref 32.0–36.0)
MCV: 90.8 fL (ref 80.0–100.0)
MPV: 10.3 fL (ref 7.5–12.5)
Platelets: 364 10*3/uL (ref 140–400)
RBC: 4.66 10*6/uL (ref 3.80–5.10)
RDW: 13 % (ref 11.0–15.0)
WBC: 8.8 10*3/uL (ref 3.8–10.8)

## 2023-07-18 LAB — COMPREHENSIVE METABOLIC PANEL WITH GFR
AG Ratio: 1.7 (calc) (ref 1.0–2.5)
ALT: 13 U/L (ref 6–29)
AST: 15 U/L (ref 10–30)
Albumin: 4.2 g/dL (ref 3.6–5.1)
Alkaline phosphatase (APISO): 89 U/L (ref 31–125)
BUN: 8 mg/dL (ref 7–25)
CO2: 28 mmol/L (ref 20–32)
Calcium: 9.6 mg/dL (ref 8.6–10.2)
Chloride: 104 mmol/L (ref 98–110)
Creat: 0.91 mg/dL (ref 0.50–0.97)
Globulin: 2.5 g/dL (ref 1.9–3.7)
Glucose, Bld: 86 mg/dL (ref 65–139)
Potassium: 4.9 mmol/L (ref 3.5–5.3)
Sodium: 139 mmol/L (ref 135–146)
Total Bilirubin: 0.5 mg/dL (ref 0.2–1.2)
Total Protein: 6.7 g/dL (ref 6.1–8.1)
eGFR: 85 mL/min/{1.73_m2} (ref >=60)

## 2023-07-18 LAB — LIPID PANEL
Cholesterol: 132 mg/dL (ref <200)
HDL: 48 mg/dL — ABNORMAL LOW (ref >=50)
LDL Cholesterol (Calc): 68 mg/dL
Non-HDL Cholesterol (Calc): 84 mg/dL (ref <130)
Total CHOL/HDL Ratio: 2.8 (calc) (ref <5.0)
Triglycerides: 77 mg/dL (ref <150)

## 2023-07-18 LAB — HEMOGLOBIN A1C
Hgb A1c MFr Bld: 5.2 % (ref <5.7)
Mean Plasma Glucose: 103 mg/dL
eAG (mmol/L): 5.7 mmol/L

## 2023-07-18 NOTE — Assessment & Plan Note (Signed)
 Encourage diet and exercise for weight loss

## 2023-07-18 NOTE — Patient Instructions (Signed)

## 2023-07-18 NOTE — Progress Notes (Signed)
 Subjective:    Patient ID: Tracy Medina, female    DOB: September 10, 1990, 33 y.o.   MRN: [id number]  HPI  Patient presents to clinic today for her annual exam.  Flu: 11/2022 Tetanus: 05/2020 COVID: Pfizer x 3 Prevnar 20: Never Pap smear: hysterectomy Dentist: as needed  Diet: She does eat meat. She consumes fruits and veggies. She does eat some fried foods. She drinks mostly water with crystal light. Exercise: None  Review of Systems     Past Medical History:  Diagnosis Date  . Anxiety   . Arthritis   . Asthma   . Depression   . Endometriosis     Current Outpatient Medications  Medication Sig Dispense Refill  . albuterol  (VENTOLIN  HFA) 108 (90 Base) MCG/ACT inhaler TAKE 2 PUFFS BY MOUTH EVERY 6 HOURS AS NEEDED FOR WHEEZE OR SHORTNESS OF BREATH 8.5 each 0  . benzonatate  (TESSALON ) 200 MG capsule Take 1 capsule (200 mg total) by mouth 3 (three) times daily as needed. 30 capsule 0  . busPIRone  (BUSPAR ) 15 MG tablet TAKE 1 TABLET BY MOUTH 3 TIMES DAILY. 270 tablet 3  . cyclobenzaprine  (FLEXERIL ) 10 MG tablet Take 1 tablet (10 mg total) by mouth 2 (two) times daily as needed for muscle spasms. 20 tablet 0  . eszopiclone  (LUNESTA ) 2 MG TABS tablet Take 1 tablet (2 mg total) by mouth at bedtime as needed for sleep. Take immediately before bedtime 30 tablet 0  . FLUoxetine  (PROZAC ) 40 MG capsule Take 1 capsule (40 mg total) by mouth daily. 90 capsule 0  . fluticasone  (FLOVENT  HFA) 110 MCG/ACT inhaler Inhale 1 puff into the lungs in the morning and at bedtime. 1 each 5  . ipratropium-albuterol  (DUONEB) 0.5-2.5 (3) MG/3ML SOLN TAKE 3 MLS BY NEBULIZATION EVERY 8 (EIGHT) HOURS AS NEEDED. 360 mL 0  . ondansetron  (ZOFRAN -ODT) 4 MG disintegrating tablet Take 1 tablet (4 mg total) by mouth every 8 (eight) hours as needed for nausea or vomiting. 30 tablet 0  . tirzepatide  (MOUNJARO ) 5 MG/0.5ML Pen Inject 5 mg into the skin once a week. 6 mL 5   No current facility-administered medications  for this visit.    Allergies  Allergen Reactions  . Cefaclor Hives and Rash  . Escitalopram Oxalate     Rage and  insomnia  . Prednisone  Other (See Comments)    Increases sx of depression  . Ascorbic Acid     Family History  Adopted: Yes  Problem Relation Age of Onset  . Hypertension Mother     Social History   Socioeconomic History  . Marital status: Single    Spouse name: Not on file  . Number of children: Not on file  . Years of education: Not on file  . Highest education level: Some college, no degree  Occupational History  . Not on file  Tobacco Use  . Smoking status: Never  . Smokeless tobacco: Never  Vaping Use  . Vaping status: Never Used  Substance and Sexual Activity  . Alcohol use: Yes    Alcohol/week: 2.0 standard drinks of alcohol    Types: 1 Cans of beer, 1 Shots of liquor per week    Comment: social  . Drug use: Never  . Sexual activity: Not Currently    Birth control/protection: Surgical    Comment: hysterectomy  Other Topics Concern  . Not on file  Social History Narrative  . Not on file   Social Drivers of Health   Financial Resource Strain: Low  Risk  (01/29/2023)   Overall Financial Resource Strain (CARDIA)   . Difficulty of Paying Living Expenses: Not very hard  Food Insecurity: No Food Insecurity (01/29/2023)   Hunger Vital Sign   . Worried About Programme researcher, broadcasting/film/video in the Last Year: Never true   . Ran Out of Food in the Last Year: Never true  Transportation Needs: No Transportation Needs (01/29/2023)   PRAPARE - Transportation   . Lack of Transportation (Medical): No   . Lack of Transportation (Non-Medical): No  Physical Activity: Insufficiently Active (01/29/2023)   Exercise Vital Sign   . Days of Exercise per Week: 3 days   . Minutes of Exercise per Session: 20 min  Stress: No Stress Concern Present (01/29/2023)   [employer] of Occupational Health - Occupational Stress Questionnaire   . Feeling of Stress : Not at all  Social  Connections: Moderately Isolated (01/29/2023)   Social Connection and Isolation Panel   . Frequency of Communication with Friends and Family: More than three times a week   . Frequency of Social Gatherings with Friends and Family: Once a week   . Attends Religious Services: More than 4 times per year   . Active Member of Clubs or Organizations: No   . Attends Banker Meetings: Patient declined   . Marital Status: Never married  Intimate Partner Violence: Patient Declined (12/12/2022)   Humiliation, Afraid, Rape, and Kick questionnaire   . Fear of Current or Ex-Partner: Patient declined   . Emotionally Abused: Patient declined   . Physically Abused: Patient declined   . Sexually Abused: Patient declined     Constitutional: Denies fever, malaise, fatigue, headache or abrupt weight changes.  HEENT: Denies eye pain, eye redness, ear pain, ringing in the ears, wax buildup, runny nose, nasal congestion, bloody nose, or sore throat. Respiratory: Denies difficulty breathing, shortness of breath, cough or sputum production.   Cardiovascular: Denies chest pain, chest tightness, palpitations or swelling in the hands or feet.  Gastrointestinal: Denies abdominal pain, bloating, constipation, diarrhea or blood in the stool.  GU: Denies urgency, frequency, pain with urination, burning sensation, blood in urine, odor or discharge. Musculoskeletal: Pt reports intermittent back pain. Denies decrease in range of motion, difficulty with gait, or joint swelling.  Skin: Denies redness, rashes, lesions or ulcercations.  Neurological: Patient reports insomnia.  Denies dizziness, difficulty with memory, difficulty with speech or problems with balance and coordination.  Psych: Patient has a history of anxiety and depression.  Denies SI/HI.  No other specific complaints in a complete review of systems (except as listed in HPI above).  Objective:   Physical Exam BP 130/82 (BP Location: Left Arm,  Patient Position: Sitting, Cuff Size: Large)   Ht 5' 7 (1.702 m)   Wt 275 lb (124.7 kg)   LMP 07/23/2021 (Approximate)   BMI 43.07 kg/m    Wt Readings from Last 3 Encounters:  01/29/23 290 lb 6.4 oz (131.7 kg)  12/12/22 (!) 301 lb 6.4 oz (136.7 kg)  07/18/22 295 lb (133.8 kg)    General: Appears her stated age, obese in NAD. Skin: Warm, dry and intact.  HEENT: Head: normal shape and size; Eyes: sclera white, no icterus, conjunctiva pink, PERRLA and EOMs intact;  Neck:  Neck supple, trachea midline. No masses, lumps or thyromegaly present.  Cardiovascular: Normal rate and rhythm. S1,S2 noted.  No murmur, rubs or gallops noted. No JVD or BLE edema. Pulmonary/Chest: Normal effort and positive vesicular breath sounds. No respiratory distress.  No wheezes, rales or ronchi noted.  Abdomen: Normal bowel sounds.  Musculoskeletal:  Strength 5/5 BUE/BLE.  No difficulty with gait.  Neurological: Alert and oriented. Cranial nerves II-XII grossly intact. Coordination normal.  Psychiatric: Mood and affect normal. Behavior is normal. Judgment and thought content normal.     BMET    Component Value Date/Time   NA 137 07/18/2022 0825   K 4.7 07/18/2022 0825   CL 103 07/18/2022 0825   CO2 27 07/18/2022 0825   GLUCOSE 92 07/18/2022 0825   BUN 8 07/18/2022 0825   CREATININE 0.77 07/18/2022 0825   CALCIUM 9.6 07/18/2022 0825   GFRNONAA >60 12/17/2021 0854    Lipid Panel     Component Value Date/Time   CHOL 176 07/18/2022 0825   TRIG 85 07/18/2022 0825   HDL 54 07/18/2022 0825   CHOLHDL 3.3 07/18/2022 0825   LDLCALC 104 (H) 07/18/2022 0825    CBC    Component Value Date/Time   WBC 8.5 07/18/2022 0825   RBC 4.63 07/18/2022 0825   HGB 13.5 07/18/2022 0825   HCT 41.5 07/18/2022 0825   PLT 383 07/18/2022 0825   MCV 89.6 07/18/2022 0825   MCH 29.2 07/18/2022 0825   MCHC 32.5 07/18/2022 0825   RDW 13.0 07/18/2022 0825   LYMPHSABS 2.4 06/10/2021 1933   MONOABS 0.7 06/10/2021 1933    EOSABS 0.2 06/10/2021 1933   BASOSABS 0.1 06/10/2021 1933    Hgb A1C Lab Results  Component Value Date   HGBA1C 5.4 07/18/2022            Assessment & Plan:   Preventative health maintenance:  Encouraged her to get a flu shot in the fall Tetanus UTD Encouraged her to get her COVID booster Prevnar 20 today She no longer needs Pap smears Encouraged her to consume a balanced diet and exercise regimen Advised her to see a dentist annually We will check CBC, c-Met, lipid, A1c today  RTC in 6 months, follow-up chronic conditions Angeline Laura, NP

## 2023-07-19 ENCOUNTER — Ambulatory Visit: Payer: Self-pay | Admitting: Internal Medicine

## 2023-09-06 ENCOUNTER — Ambulatory Visit
Admission: RE | Admit: 2023-09-06 | Discharge: 2023-09-06 | Disposition: A | Discharge status: Home / Self Care | Source: Ambulatory Visit | Attending: Internal Medicine | Admitting: Internal Medicine

## 2023-09-06 VITALS — BP 131/89 | HR 78 | Temp 98.6°F | Resp 15 | Ht 67.0 in | Wt 274.9 lb

## 2023-09-06 DIAGNOSIS — R112 Nausea with vomiting, unspecified: Secondary | ICD-10-CM

## 2023-09-06 NOTE — Discharge Instructions (Signed)
 Avoid spicy,greasy fried foods,avoid pasta/excessive carbohydrate intake This may be a viral illness or side effect of your weight loss medication-check with PCP If this continues for next 24 hours with Zofran, unable to keep Pedialyte, IV hydration,gatorade or LMNT down,etc  will need to go to ER for further evaluation.

## 2023-09-06 NOTE — ED Triage Notes (Signed)
 Patient reports N/V that started yesterday.  Patient tried her Zofran  with no relief.  Patient denies any cold symptoms.  Patient denies fevers.

## 2023-09-06 NOTE — ED Provider Notes (Signed)
 [facility]    CSN: [id number] Arrival date & time: 09/06/23  1118      History   Chief Complaint Chief Complaint  Patient presents with   Nausea    Appointment   Emesis    HPI Tracy Medina is a 33 y.o. female.   33 year old female, Tracy Medina, presents to urgent care for evaluation of nausea and vomiting that started yesterday.  Patient denies any fever or URI symptoms, states she took a Zofran  with little to no relief, took Tirzepatide  injection 2 days prior. Ate pasta last night.  No known illness exposure, vomited 5 x total (2 today), pushing fluids  The history is provided by the patient. No language interpreter was used.    Past Medical History:  Diagnosis Date   Anxiety    Arthritis    Asthma    Depression    Endometriosis     Patient Active Problem List   Diagnosis Date Noted   Nausea and vomiting 09/06/2023   Chronic midline low back pain without sciatica 12/12/2022   Class 3 severe obesity due to excess calories with body mass index (BMI) of 40.0 to 44.9 in adult 05/30/2021   Insomnia 05/24/2020   Iron deficiency 02/29/2020   Anxiety and depression 02/29/2020   Asthma 02/29/2020    Past Surgical History:  Procedure Laterality Date   ABDOMINAL HYSTERECTOMY     CHOLECYSTECTOMY     LAPAROSCOPY     TONSILLECTOMY AND ADENOIDECTOMY     TOTAL LAPAROSCOPIC HYSTERECTOMY WITH SALPINGECTOMY Bilateral 08/21/2021   Procedure: TOTAL LAPAROSCOPIC HYSTERECTOMY WITH SALPINGECTOMY;  Surgeon: Janit Alm Agent, MD;  Location: [facility];  Service: Gynecology;  Laterality: Bilateral;   TYMPANOSTOMY TUBE PLACEMENT      OB History     Gravida  0   Para  0   Term  0   Preterm  0   AB  0   Living  0      SAB  0   IAB  0   Ectopic  0   Multiple  0   Live Births  0            Home Medications    Prior to Admission medications   Medication Sig Start Date End Date Taking? Authorizing Provider  albuterol  (VENTOLIN  HFA)  108 (90 Base) MCG/ACT inhaler TAKE 2 PUFFS BY MOUTH EVERY 6 HOURS AS NEEDED FOR WHEEZE OR SHORTNESS OF BREATH 11/25/20   Antonette Angeline ORN, NP  benzonatate  (TESSALON ) 200 MG capsule Take 1 capsule (200 mg total) by mouth 3 (three) times daily as needed. 12/24/22   Antonette Angeline ORN, NP  busPIRone  (BUSPAR ) 15 MG tablet TAKE 1 TABLET BY MOUTH 3 TIMES DAILY. 07/30/22   Antonette Angeline ORN, NP  cyclobenzaprine  (FLEXERIL ) 10 MG tablet Take 1 tablet (10 mg total) by mouth 2 (two) times daily as needed for muscle spasms. 04/11/23   Antonette Angeline ORN, NP  eszopiclone  (LUNESTA ) 2 MG TABS tablet Take 1 tablet (2 mg total) by mouth at bedtime as needed for sleep. Take immediately before bedtime 09/29/21   Antonette Angeline ORN, NP  FLUoxetine  (PROZAC ) 40 MG capsule Take 1 capsule (40 mg total) by mouth daily. 06/04/22   Antonette Angeline ORN, NP  fluticasone  (FLONASE ) 50 MCG/ACT nasal spray Place 2 sprays into both nostrils daily. 05/29/23   [provider]  fluticasone  (FLOVENT  HFA) 110 MCG/ACT inhaler Inhale 1 puff into the lungs in the morning and at bedtime. 03/21/22  Antonette Angeline ORN, NP  ipratropium-albuterol  (DUONEB) 0.5-2.5 (3) MG/3ML SOLN TAKE 3 MLS BY NEBULIZATION EVERY 8 (EIGHT) HOURS AS NEEDED. 01/18/22   Antonette Angeline ORN, NP  ondansetron  (ZOFRAN -ODT) 4 MG disintegrating tablet Take 1 tablet (4 mg total) by mouth every 8 (eight) hours as needed for nausea or vomiting. 01/29/23   Antonette Angeline ORN, NP  tirzepatide  (MOUNJARO ) 5 MG/0.5ML Pen Inject 5 mg into the skin once a week. 01/09/23   Antonette Angeline ORN, NP    Family History Family History  Adopted: Yes  Problem Relation Age of Onset   Hypertension Mother     Social History Social History   Tobacco Use   Smoking status: Never   Smokeless tobacco: Never  Vaping Use   Vaping status: Never Used  Substance Use Topics   Alcohol use: Yes    Alcohol/week: 2.0 standard drinks of alcohol    Types: 1 Cans of beer, 1 Shots of liquor per week    Comment: social   Drug use:  Never     Allergies   Cefaclor, Escitalopram oxalate, Prednisone , and Ascorbic acid   Review of Systems Review of Systems  Constitutional:  Negative for fever.  Gastrointestinal:  Positive for nausea and vomiting. Negative for abdominal pain, constipation and diarrhea.  All other systems reviewed and are negative.    Physical Exam Triage Vital Signs ED Triage Vitals  Encounter Vitals Group     BP 09/06/23 1128 131/89     Girls Systolic BP Percentile --      Girls Diastolic BP Percentile --      Boys Systolic BP Percentile --      Boys Diastolic BP Percentile --      Pulse Rate 09/06/23 1128 78     Resp 09/06/23 1128 15     Temp 09/06/23 1128 98.6 F (37 C)     Temp Source 09/06/23 1128 Oral     SpO2 09/06/23 1128 95 %     Weight 09/06/23 1127 274 lb 14.6 oz (124.7 kg)     Height 09/06/23 1127 5' 7 (1.702 m)     Head Circumference --      Peak Flow --      Pain Score 09/06/23 1127 0     Pain Loc --      Pain Education --      Exclude from Growth Chart --    No data found.  Updated Vital Signs BP 131/89 (BP Location: Right Arm)   Pulse 78   Temp 98.6 F (37 C) (Oral)   Resp 15   Ht 5' 7 (1.702 m)   Wt 274 lb 14.6 oz (124.7 kg)   LMP 07/23/2021 (Approximate)   SpO2 95%   BMI 43.06 kg/m   Visual Acuity Right Eye Distance:   Left Eye Distance:   Bilateral Distance:    Right Eye Near:   Left Eye Near:    Bilateral Near:     Physical Exam Vitals and nursing note reviewed.  HENT:     Head: Normocephalic.  Cardiovascular:     Rate and Rhythm: Normal rate and regular rhythm.     Heart sounds: Normal heart sounds.     Comments: Rate 78 Abdominal:     General: Bowel sounds are increased.     Palpations: Abdomen is soft.     Tenderness: There is no abdominal tenderness. There is no guarding or rebound.  Neurological:     General: No focal deficit present.  Mental Status: She is alert and oriented to person, place, and time.     GCS: GCS eye  subscore is 4. GCS verbal subscore is 5. GCS motor subscore is 6.  Psychiatric:        Attention and Perception: Attention normal.        Mood and Affect: Mood normal.        Speech: Speech normal.        Behavior: Behavior normal. Behavior is cooperative.      UC Treatments / Results  Labs (all labs ordered are listed, but only abnormal results are displayed) Labs Reviewed - No data to display  EKG   Radiology No results found.  Procedures Procedures (including critical care time)  Medications Ordered in UC Medications - No data to display  Initial Impression / Assessment and Plan / UC Course  I have reviewed the triage vital signs and the nursing notes.  Pertinent labs & imaging results that were available during my care of the patient were reviewed by me and considered in my medical decision making (see chart for details).    Discussed exam findings and plan of care with patient, non toxic appearing, has zofran  at home, pt requesting work note, strict go to ER precautions given.   Patient verbalized understanding to this provider.  Ddx: Nausea,vomiting, side effect of weight loss med, viral illness Final Clinical Impressions(s) / UC Diagnoses   Final diagnoses:  Nausea and vomiting, unspecified vomiting type     Discharge Instructions      Avoid spicy,greasy fried foods,avoid pasta/excessive carbohydrate intake This may be a viral illness or side effect of your weight loss medication-check with PCP If this continues for next 24 hours with Zofran , unable to keep Pedialyte, IV hydration,gatorade or LMNT down,etc  will need to go to ER for further evaluation.      ED Prescriptions   None    PDMP not reviewed this encounter.   Aminta Loose, NP 09/06/23 2135

## 2023-09-09 ENCOUNTER — Encounter: Payer: Self-pay | Admitting: Internal Medicine

## 2023-09-17 DIAGNOSIS — F4312 Post-traumatic stress disorder, chronic: Secondary | ICD-10-CM | POA: Diagnosis not present

## 2023-09-24 DIAGNOSIS — F4312 Post-traumatic stress disorder, chronic: Secondary | ICD-10-CM | POA: Diagnosis not present

## 2023-10-08 DIAGNOSIS — F4312 Post-traumatic stress disorder, chronic: Secondary | ICD-10-CM | POA: Diagnosis not present

## 2023-10-21 ENCOUNTER — Ambulatory Visit
Admission: EM | Admit: 2023-10-21 | Discharge: 2023-10-21 | Disposition: A | Discharge status: Home / Self Care | Attending: Emergency Medicine | Admitting: Emergency Medicine

## 2023-10-21 DIAGNOSIS — L0291 Cutaneous abscess, unspecified: Secondary | ICD-10-CM | POA: Diagnosis not present

## 2023-10-21 MED ORDER — SULFAMETHOXAZOLE-TRIMETHOPRIM 800-160 MG PO TABS
1.0000 | ORAL_TABLET | Freq: Two times a day (BID) | ORAL | 0 refills | Status: AC
Start: 2023-10-21 — End: 2023-10-28

## 2023-10-21 NOTE — Discharge Instructions (Addendum)
 Take the antibiotic as directed.  Use warm compresses as directed.  Follow-up with your primary care provider tomorrow.

## 2023-10-21 NOTE — ED Triage Notes (Signed)
 Patient to Urgent Care with complaints of an abscess present to the right side of groin. Denies any fevers.  Symptoms x1 week. Some purulent/ bloody drainage noted this evening.   Using warm compresses.

## 2023-10-21 NOTE — ED Provider Notes (Signed)
 CA[facility] RAL[facility] PELT    CSN: [id number] Arrival date & time: 10/21/23  1914      History   Chief Complaint Chief Complaint  Patient presents with   Abscess    HPI Tracy Medina is a 33 y.o. female.  Accompanied by her mother, patient presents with a painful abscess on her right groin x 1 week.  The area has had some purulent drainage this evening.  No treatments attempted at home.  No fever or chills.  The history is provided by the patient, a parent and medical records.    Past Medical History:  Diagnosis Date   Anxiety    Arthritis    Asthma    Depression    Endometriosis     Patient Active Problem List   Diagnosis Date Noted   Nausea and vomiting 09/06/2023   Chronic midline low back pain without sciatica 12/12/2022   Class 3 severe obesity due to excess calories with body mass index (BMI) of 40.0 to 44.9 in adult 05/30/2021   Insomnia 05/24/2020   Iron deficiency 02/29/2020   Anxiety and depression 02/29/2020   Asthma 02/29/2020    Past Surgical History:  Procedure Laterality Date   ABDOMINAL HYSTERECTOMY     CHOLECYSTECTOMY     LAPAROSCOPY     TONSILLECTOMY AND ADENOIDECTOMY     TOTAL LAPAROSCOPIC HYSTERECTOMY WITH SALPINGECTOMY Bilateral 08/21/2021   Procedure: TOTAL LAPAROSCOPIC HYSTERECTOMY WITH SALPINGECTOMY;  Surgeon: Janit Alm Agent, MD;  Location: [facility] ORS;  Service: Gynecology;  Laterality: Bilateral;   TYMPANOSTOMY TUBE PLACEMENT      OB History     Gravida  0   Para  0   Term  0   Preterm  0   AB  0   Living  0      SAB  0   IAB  0   Ectopic  0   Multiple  0   Live Births  0            Home Medications    Prior to Admission medications   Medication Sig Start Date End Date Taking? Authorizing Provider  busPIRone  (BUSPAR ) 15 MG tablet TAKE 1 TABLET BY MOUTH 3 TIMES DAILY. 07/30/22  Yes Baity, Angeline ORN, NP  eszopiclone  (LUNESTA ) 2 MG TABS tablet Take 1 tablet (2 mg total) by mouth at bedtime as needed for  sleep. Take immediately before bedtime 09/29/21  Yes Baity, Angeline ORN, NP  FLUoxetine  (PROZAC ) 40 MG capsule Take 1 capsule (40 mg total) by mouth daily. 06/04/22  Yes Baity, Angeline ORN, NP  fluticasone  (FLOVENT  HFA) 110 MCG/ACT inhaler Inhale 1 puff into the lungs in the morning and at bedtime. 03/21/22  Yes Baity, Angeline ORN, NP  sulfamethoxazole -trimethoprim  (BACTRIM  DS) 800-160 MG tablet Take 1 tablet by mouth 2 (two) times daily for 7 days. 10/21/23 10/28/23 Yes Corlis Burnard DEL, NP  albuterol  (VENTOLIN  HFA) 108 (90 Base) MCG/ACT inhaler TAKE 2 PUFFS BY MOUTH EVERY 6 HOURS AS NEEDED FOR WHEEZE OR SHORTNESS OF BREATH 11/25/20   Antonette Angeline ORN, NP  benzonatate  (TESSALON ) 200 MG capsule Take 1 capsule (200 mg total) by mouth 3 (three) times daily as needed. Patient not taking: Reported on 10/21/2023 12/24/22   Antonette Angeline ORN, NP  cyclobenzaprine  (FLEXERIL ) 10 MG tablet Take 1 tablet (10 mg total) by mouth 2 (two) times daily as needed for muscle spasms. 04/11/23   Antonette Angeline ORN, NP  fluticasone  (FLONASE ) 50 MCG/ACT nasal spray Place 2 sprays into both  nostrils daily. 05/29/23   [provider]  ipratropium-albuterol  (DUONEB) 0.5-2.5 (3) MG/3ML SOLN TAKE 3 MLS BY NEBULIZATION EVERY 8 (EIGHT) HOURS AS NEEDED. Patient not taking: Reported on 10/21/2023 01/18/22   Antonette Angeline ORN, NP  ondansetron  (ZOFRAN -ODT) 4 MG disintegrating tablet Take 1 tablet (4 mg total) by mouth every 8 (eight) hours as needed for nausea or vomiting. 01/29/23   Antonette Angeline ORN, NP  tirzepatide  (MOUNJARO ) 5 MG/0.5ML Pen Inject 5 mg into the skin once a week. Patient not taking: Reported on 10/21/2023 01/09/23   Antonette Angeline ORN, NP    Family History Family History  Adopted: Yes  Problem Relation Age of Onset   Hypertension Mother     Social History Social History   Tobacco Use   Smoking status: Never   Smokeless tobacco: Never  Vaping Use   Vaping status: Never Used  Substance Use Topics   Alcohol use: Yes    Alcohol/week:  2.0 standard drinks of alcohol    Types: 1 Cans of beer, 1 Shots of liquor per week    Comment: social   Drug use: Never     Allergies   Cefaclor, Escitalopram oxalate, Prednisone , and Ascorbic acid   Review of Systems Review of Systems  Constitutional:  Negative for chills and fever.  Skin:  Positive for color change and wound.     Physical Exam Triage Vital Signs ED Triage Vitals  Encounter Vitals Group     BP 10/21/23 1955 139/89     Girls Systolic BP Percentile --      Girls Diastolic BP Percentile --      Boys Systolic BP Percentile --      Boys Diastolic BP Percentile --      Pulse Rate 10/21/23 1955 83     Resp 10/21/23 1955 18     Temp 10/21/23 1955 97.8 F (36.6 C)     Temp src --      SpO2 10/21/23 1955 98 %     Weight --      Height --      Head Circumference --      Peak Flow --      Pain Score 10/21/23 1921 8     Pain Loc --      Pain Education --      Exclude from Growth Chart --    No data found.  Updated Vital Signs BP 139/89   Pulse 83   Temp 97.8 F (36.6 C)   Resp 18   LMP 07/23/2021 (Approximate)   SpO2 98%   Visual Acuity Right Eye Distance:   Left Eye Distance:   Bilateral Distance:    Right Eye Near:   Left Eye Near:    Bilateral Near:     Physical Exam Constitutional:      General: She is not in acute distress.    Appearance: She is obese.  HENT:     Mouth/Throat:     Mouth: Mucous membranes are moist.  Cardiovascular:     Rate and Rhythm: Normal rate and regular rhythm.  Pulmonary:     Effort: Pulmonary effort is normal. No respiratory distress.  Skin:    General: Skin is warm and dry.     Findings: Erythema and lesion present.     Comments: Skin abscess of right groin in hairline.  Small opening with purulent drainage.  Patient would not tolerate expression of the abscess and would not allow I&D today.  Neurological:  Mental Status: She is alert.      UC Treatments / Results  Labs (all labs ordered are  listed, but only abnormal results are displayed) Labs Reviewed - No data to display  EKG   Radiology No results found.  Procedures Procedures (including critical care time)  Medications Ordered in UC Medications - No data to display  Initial Impression / Assessment and Plan / UC Course  I have reviewed the triage vital signs and the nursing notes.  Pertinent labs & imaging results that were available during my care of the patient were reviewed by me and considered in my medical decision making (see chart for details).    Skin abscess of right groin.  This appears to be an ingrown hair.  Patient would not allow I&D today and would not tolerate attempted expression through the small opening that is currently present.  Discussed warm compresses.  Treating today with Bactrim .  Instructed patient to follow-up with her PCP tomorrow.  She agrees to plan of care.  Final Clinical Impressions(s) / UC Diagnoses   Final diagnoses:  Cutaneous abscess, unspecified site     Discharge Instructions      Take the antibiotic as directed.  Use warm compresses as directed.  Follow-up with your primary care provider tomorrow.     ED Prescriptions     Medication Sig Dispense Auth. Provider   sulfamethoxazole -trimethoprim  (BACTRIM  DS) 800-160 MG tablet Take 1 tablet by mouth 2 (two) times daily for 7 days. 14 tablet Corlis Burnard DEL, NP      PDMP not reviewed this encounter.   Corlis Burnard DEL, NP 10/21/23 2013

## 2023-10-22 DIAGNOSIS — F4312 Post-traumatic stress disorder, chronic: Secondary | ICD-10-CM | POA: Diagnosis not present

## 2023-10-24 ENCOUNTER — Encounter: Payer: Self-pay | Admitting: Internal Medicine

## 2023-10-24 ENCOUNTER — Ambulatory Visit: Admitting: Internal Medicine

## 2023-10-24 VITALS — BP 132/84 | Ht 67.0 in | Wt 286.4 lb

## 2023-10-24 DIAGNOSIS — F419 Anxiety disorder, unspecified: Secondary | ICD-10-CM | POA: Diagnosis not present

## 2023-10-24 DIAGNOSIS — E66813 Obesity, class 3: Secondary | ICD-10-CM | POA: Diagnosis not present

## 2023-10-24 DIAGNOSIS — F32A Depression, unspecified: Secondary | ICD-10-CM | POA: Diagnosis not present

## 2023-10-24 DIAGNOSIS — Z6841 Body Mass Index (BMI) 40.0 and over, adult: Secondary | ICD-10-CM

## 2023-10-24 DIAGNOSIS — L02214 Cutaneous abscess of groin: Secondary | ICD-10-CM

## 2023-10-24 MED ORDER — FLUOXETINE HCL 40 MG PO CAPS: 40.0000 mg | ORAL_CAPSULE | Freq: Every day | ORAL | 0 refills | Status: DC

## 2023-10-24 MED ORDER — BUSPIRONE HCL 15 MG PO TABS: 15.0000 mg | ORAL_TABLET | Freq: Three times a day (TID) | ORAL | 0 refills | Status: AC

## 2023-10-24 NOTE — Patient Instructions (Signed)
 Skin Abscess    A skin abscess is an infected spot of skin. It can have pus in it. An abscess can happen in any part of your body.  Some abscesses break open (rupture) on their own. Most keep getting worse unless they are treated. If your abscess is not treated, the infection can spread deeper into your body and blood. This can make you feel sick.  What are the causes?  Germs that enter your skin. This may happen if you have:  A cut or scrape.  A wound from a needle or an insect bite.  Blocked oil or sweat glands.  A problem with the spot where your hair goes into your skin.  A fluid-filled sac called a cyst under your skin.  What increases the risk?  Having problems with how your blood moves through your body.  Having a weak body defense system (immune system).  Having diabetes.  Having dry and irritated skin.  Needing to get shots often.  Putting drugs into your body with a needle.  Having a splinter or something else in your skin.  Smoking.  What are the signs or symptoms?  A firm bump under your skin that hurts.  A bump with pus at the top.  Redness and swelling.  Warm or tender spots.  A sore on the skin.  How is this treated?  You may need to:  Put a heat pack or a warm, wet washcloth on the spot.  Have the pus drained.  Take antibiotics.  Follow these instructions at home:  Medicines  Take over-the-counter and prescription medicines only as told by your doctor.  If you were prescribed antibiotics, take them as told by your doctor. Do not stop taking them even if you start to feel better.  Abscess care    If you have an abscess that has not drained, put heat on it. Use the heat source that your doctor recommends, such as a moist heat pack or a heating pad.  Place a towel between your skin and the heat source.  Leave the heat on for 20-30 minutes.  If your skin turns bright red, take off the heat right away to prevent burns. The risk of burns is higher if you cannot feel pain, heat, or cold.  Follow  instructions from your doctor about how to take care of your abscess. Make sure you:  Cover the abscess with a bandage.  Wash your hands with soap and water for at least 20 seconds before and after you change your bandage. If you cannot use soap and water, use hand sanitizer.  Change your bandage as told by your doctor.  Check your abscess every day for signs that the infection is getting worse. Check for:  More redness, swelling, or pain.  More fluid or blood.  Warmth.  More pus or a worse smell.  General instructions  To keep the infection from spreading:  Do not share personal items or towels.  Do not go in a hot tub with others.  Avoid making skin contact with others.  Be careful when you get rid of used bandages or any pus from the abscess.  Do not smoke or use any products that contain nicotine or tobacco. If you need help quitting, ask your doctor.  Contact a doctor if:  You see red streaks on your skin near the abscess.  You have any signs of worse infection.  You vomit every time you eat or drink.  You have  a fever, chills, or muscle aches.  The cyst or abscess comes back.  Get help right away if:  You have very bad pain.  You make less pee (urine) than normal.  This information is not intended to replace advice given to you by your health care provider. Make sure you discuss any questions you have with your health care provider.  Document Revised: 08/23/2021 Document Reviewed: 08/23/2021  Elsevier Patient Education  2024 [employer].

## 2023-10-24 NOTE — Progress Notes (Signed)
 Subjective:    Patient ID: Tracy Medina, female    DOB: 25-Apr-1990, 33 y.o.   MRN: [id number]  HPI  Discussed the use of AI scribe software for clinical note transcription with the patient, who gave verbal consent to proceed.  Tracy Medina is a 33 year old female who presents with medication refill and abscess management.  She has been off her antidepressants, Buspirone  and Prozac , for two weeks after leaving them in [place] , leading to increased depression with 'failure type of thoughts'. No self-harm ideation. She needs a refill for these medications.  She has an abscess located at the crease of her inner thigh near the vaginal area. Initially sought care at urgent care on September 29th, where drainage was advised. She was prescribed Bactrim  at urgent care and reports that her pain has improved and the abscess seems smaller. No fever, chills, nausea, or vomiting associated with the abscess.  She has a history of obesity and has been exploring weight loss options. Stopped taking Mounjaro  due to adverse effects and has been considering weight loss surgery. Reports a history of stress eating and has been working with a therapist for anxiety and depression. Current weight is 286 pounds, down from 300 pounds before starting Zepbound , which she stopped two months ago due to adverse side effects.   Review of Systems     Past Medical History:  Diagnosis Date   Anxiety    Arthritis    Asthma    Depression    Endometriosis     Current Outpatient Medications  Medication Sig Dispense Refill   albuterol  (VENTOLIN  HFA) 108 (90 Base) MCG/ACT inhaler TAKE 2 PUFFS BY MOUTH EVERY 6 HOURS AS NEEDED FOR WHEEZE OR SHORTNESS OF BREATH 8.5 each 0   benzonatate  (TESSALON ) 200 MG capsule Take 1 capsule (200 mg total) by mouth 3 (three) times daily as needed. (Patient not taking: Reported on 10/21/2023) 30 capsule 0   busPIRone  (BUSPAR ) 15 MG tablet TAKE 1 TABLET BY MOUTH 3 TIMES DAILY. 270 tablet  3   cyclobenzaprine  (FLEXERIL ) 10 MG tablet Take 1 tablet (10 mg total) by mouth 2 (two) times daily as needed for muscle spasms. 20 tablet 0   eszopiclone  (LUNESTA ) 2 MG TABS tablet Take 1 tablet (2 mg total) by mouth at bedtime as needed for sleep. Take immediately before bedtime 30 tablet 0   FLUoxetine  (PROZAC ) 40 MG capsule Take 1 capsule (40 mg total) by mouth daily. 90 capsule 0   fluticasone  (FLONASE ) 50 MCG/ACT nasal spray Place 2 sprays into both nostrils daily.     fluticasone  (FLOVENT  HFA) 110 MCG/ACT inhaler Inhale 1 puff into the lungs in the morning and at bedtime. 1 each 5   ipratropium-albuterol  (DUONEB) 0.5-2.5 (3) MG/3ML SOLN TAKE 3 MLS BY NEBULIZATION EVERY 8 (EIGHT) HOURS AS NEEDED. (Patient not taking: Reported on 10/21/2023) 360 mL 0   ondansetron  (ZOFRAN -ODT) 4 MG disintegrating tablet Take 1 tablet (4 mg total) by mouth every 8 (eight) hours as needed for nausea or vomiting. 30 tablet 0   sulfamethoxazole -trimethoprim  (BACTRIM  DS) 800-160 MG tablet Take 1 tablet by mouth 2 (two) times daily for 7 days. 14 tablet 0   tirzepatide  (MOUNJARO ) 5 MG/0.5ML Pen Inject 5 mg into the skin once a week. (Patient not taking: Reported on 10/21/2023) 6 mL 5   No current facility-administered medications for this visit.    Allergies  Allergen Reactions   Cefaclor Hives and Rash   Escitalopram Oxalate     Rage  and  insomnia   Prednisone  Other (See Comments)    Increases sx of depression   Ascorbic Acid     Family History  Adopted: Yes  Problem Relation Age of Onset   Hypertension Mother     Social History   Socioeconomic History   Marital status: Single    Spouse name: Not on file   Number of children: Not on file   Years of education: Not on file   Highest education level: Some college, no degree  Occupational History   Not on file  Tobacco Use   Smoking status: Never   Smokeless tobacco: Never  Vaping Use   Vaping status: Never Used  Substance and Sexual Activity    Alcohol use: Yes    Alcohol/week: 2.0 standard drinks of alcohol    Types: 1 Cans of beer, 1 Shots of liquor per week    Comment: social   Drug use: Never   Sexual activity: Not Currently    Birth control/protection: Surgical    Comment: hysterectomy  Other Topics Concern   Not on file  Social History Narrative   Not on file   Social Drivers of Health   Financial Resource Strain: Low Risk  (01/29/2023)   Overall Financial Resource Strain (CARDIA)    Difficulty of Paying Living Expenses: Not very hard  Food Insecurity: No Food Insecurity (01/29/2023)   Hunger Vital Sign    Worried About Running Out of Food in the Last Year: Never true    Ran Out of Food in the Last Year: Never true  Transportation Needs: No Transportation Needs (01/29/2023)   PRAPARE - Administrator, Civil Service (Medical): No    Lack of Transportation (Non-Medical): No  Physical Activity: Insufficiently Active (01/29/2023)   Exercise Vital Sign    Days of Exercise per Week: 3 days    Minutes of Exercise per Session: 20 min  Stress: No Stress Concern Present (01/29/2023)   [employer] of Occupational Health - Occupational Stress Questionnaire    Feeling of Stress : Not at all  Social Connections: Moderately Isolated (01/29/2023)   Social Connection and Isolation Panel    Frequency of Communication with Friends and Family: More than three times a week    Frequency of Social Gatherings with Friends and Family: Once a week    Attends Religious Services: More than 4 times per year    Active Member of [employer] or Organizations: No    Attends Banker Meetings: Patient declined    Marital Status: Never married  Intimate Partner Violence: Patient Declined (12/12/2022)   Humiliation, Afraid, Rape, and Kick questionnaire    Fear of Current or Ex-Partner: Patient declined    Emotionally Abused: Patient declined    Physically Abused: Patient declined    Sexually Abused: Patient declined      Constitutional: Denies fever, malaise, fatigue, headache or abrupt weight changes.  HEENT: Denies eye pain, eye redness, ear pain, ringing in the ears, wax buildup, runny nose, nasal congestion, bloody nose, or sore throat. Respiratory: Denies difficulty breathing, shortness of breath, cough or sputum production.   Cardiovascular: Denies chest pain, chest tightness, palpitations or swelling in the hands or feet.  Gastrointestinal: Denies abdominal pain, bloating, constipation, diarrhea or blood in the stool.  GU: Denies urgency, frequency, pain with urination, burning sensation, blood in urine, odor or discharge. Musculoskeletal: Pt reports intermittent back pain. Denies decrease in range of motion, difficulty with gait, or joint swelling.  Skin: Pt  reports abscess of right groin. Denies redness, rashes, or ulcercations.  Neurological: Patient reports insomnia.  Denies dizziness, difficulty with memory, difficulty with speech or problems with balance and coordination.  Psych: Patient has a history of anxiety and depression.  Denies SI/HI.  No other specific complaints in a complete review of systems (except as listed in HPI above).  Objective:   Physical Exam BP 132/84 (BP Location: Right Arm, Patient Position: Sitting, Cuff Size: Large)   Ht 5' 7 (1.702 m)   Wt 286 lb 6.4 oz (129.9 kg)   LMP 07/23/2021 (Approximate)   BMI 44.86 kg/m    Wt Readings from Last 3 Encounters:  09/06/23 274 lb 14.6 oz (124.7 kg)  07/18/23 275 lb (124.7 kg)  01/29/23 290 lb 6.4 oz (131.7 kg)    General: Appears her stated age, obese in NAD. Skin: Warm, dry and intact. 2 cm area of hard induration in the right groin, no drainage HEENT: Head: normal shape and size; Eyes: sclera white, no icterus, conjunctiva pink, PERRLA and EOMs intact;  Cardiovascular: Normal rate and rhythm.  Pulmonary/Chest: Normal effort and positive vesicular breath sounds. No respiratory distress. No wheezes, rales or ronchi  noted.  Musculoskeletal:   No difficulty with gait.  Neurological: Alert and oriented. Coordination normal.  Psychiatric: Mood and affect normal. Behavior is normal. Judgment and thought content normal.     BMET    Component Value Date/Time   NA 139 07/18/2023 0900   K 4.9 07/18/2023 0900   CL 104 07/18/2023 0900   CO2 28 07/18/2023 0900   GLUCOSE 86 07/18/2023 0900   BUN 8 07/18/2023 0900   CREATININE 0.91 07/18/2023 0900   CALCIUM 9.6 07/18/2023 0900   GFRNONAA >60 12/17/2021 0854    Lipid Panel     Component Value Date/Time   CHOL 132 07/18/2023 0900   TRIG 77 07/18/2023 0900   HDL 48 (L) 07/18/2023 0900   CHOLHDL 2.8 07/18/2023 0900   LDLCALC 68 07/18/2023 0900    CBC    Component Value Date/Time   WBC 8.8 07/18/2023 0900   RBC 4.66 07/18/2023 0900   HGB 14.0 07/18/2023 0900   HCT 42.3 07/18/2023 0900   PLT 364 07/18/2023 0900   MCV 90.8 07/18/2023 0900   MCH 30.0 07/18/2023 0900   MCHC 33.1 07/18/2023 0900   RDW 13.0 07/18/2023 0900   LYMPHSABS 2.4 06/10/2021 1933   MONOABS 0.7 06/10/2021 1933   EOSABS 0.2 06/10/2021 1933   BASOSABS 0.1 06/10/2021 1933    Hgb A1C Lab Results  Component Value Date   HGBA1C 5.2 07/18/2023            Assessment & Plan:   Assessment and Plan    Urgent care followup for abscess of groin Urgent care note reviewed. Abscess in the groin area improved with Bactrim , no drainage needed. - Continue Bactrim . - Recommend warm compresses or hot bath twice daily. - Avoid shaving until resolved. - Educated on razor hygiene.  Morbid obesity (BMI 40-44.9) BMI 44.86. Considering weight loss surgery. Discussed need for addressing eating habits and stress-related eating. Potential Wellbutrin  use for cravings. - Complete letter for weight loss surgery. - Discuss Wellbutrin  150 mg XL daily for cravings, however she reports she has tried this in the past for her mood issues and it was not effective. - Encourage therapy for  stress-related eating.  Anxiety and depression Off Buspirone  and Prozac , increased depressive symptoms, no suicidal ideation. Therapy providing some benefit. - Refill Buspirone   15 mg TID  and Prozac  40 mg daily. - Encourage continued therapy.      RTC in 2 months, follow-up chronic conditions Angeline Laura, NP

## 2023-10-28 ENCOUNTER — Encounter: Payer: Self-pay | Admitting: Internal Medicine

## 2023-10-28 DIAGNOSIS — M47816 Spondylosis without myelopathy or radiculopathy, lumbar region: Secondary | ICD-10-CM | POA: Diagnosis not present

## 2023-10-28 DIAGNOSIS — S39012A Strain of muscle, fascia and tendon of lower back, initial encounter: Secondary | ICD-10-CM | POA: Diagnosis not present

## 2023-10-30 DIAGNOSIS — M545 Low back pain, unspecified: Secondary | ICD-10-CM | POA: Diagnosis not present

## 2023-11-05 DIAGNOSIS — M545 Low back pain, unspecified: Secondary | ICD-10-CM | POA: Diagnosis not present

## 2023-11-08 DIAGNOSIS — M545 Low back pain, unspecified: Secondary | ICD-10-CM | POA: Diagnosis not present

## 2023-11-12 DIAGNOSIS — F4312 Post-traumatic stress disorder, chronic: Secondary | ICD-10-CM | POA: Diagnosis not present

## 2023-11-13 DIAGNOSIS — M545 Low back pain, unspecified: Secondary | ICD-10-CM | POA: Diagnosis not present

## 2023-11-19 DIAGNOSIS — M5416 Radiculopathy, lumbar region: Secondary | ICD-10-CM | POA: Diagnosis not present

## 2023-12-03 DIAGNOSIS — F4312 Post-traumatic stress disorder, chronic: Secondary | ICD-10-CM | POA: Diagnosis not present

## 2023-12-04 DIAGNOSIS — M5416 Radiculopathy, lumbar region: Secondary | ICD-10-CM | POA: Diagnosis not present

## 2023-12-10 DIAGNOSIS — F4312 Post-traumatic stress disorder, chronic: Secondary | ICD-10-CM | POA: Diagnosis not present

## 2023-12-12 DIAGNOSIS — M545 Low back pain, unspecified: Secondary | ICD-10-CM | POA: Diagnosis not present

## 2023-12-22 ENCOUNTER — Other Ambulatory Visit: Payer: Self-pay

## 2023-12-22 ENCOUNTER — Emergency Department
Admission: EM | Admit: 2023-12-22 | Discharge: 2023-12-22 | Disposition: A | Discharge status: Home / Self Care | Attending: Emergency Medicine | Admitting: Emergency Medicine

## 2023-12-22 ENCOUNTER — Emergency Department

## 2023-12-22 DIAGNOSIS — R0989 Other specified symptoms and signs involving the circulatory and respiratory systems: Secondary | ICD-10-CM | POA: Diagnosis not present

## 2023-12-22 DIAGNOSIS — J45909 Unspecified asthma, uncomplicated: Secondary | ICD-10-CM | POA: Diagnosis not present

## 2023-12-22 DIAGNOSIS — R059 Cough, unspecified: Secondary | ICD-10-CM | POA: Diagnosis not present

## 2023-12-22 DIAGNOSIS — J069 Acute upper respiratory infection, unspecified: Secondary | ICD-10-CM | POA: Diagnosis not present

## 2023-12-22 LAB — RESP PANEL BY RT-PCR (RSV, FLU A&B, COVID)  RVPGX2
Influenza A by PCR: NEGATIVE
Influenza B by PCR: NEGATIVE
Resp Syncytial Virus by PCR: NEGATIVE
SARS Coronavirus 2 by RT PCR: NEGATIVE

## 2023-12-22 MED ORDER — DEXAMETHASONE SOD PHOSPHATE PF 10 MG/ML IJ SOLN
10.0000 mg | Freq: Once | INTRAMUSCULAR | Status: AC
  Administered 2023-12-22: 10 mg via INTRAMUSCULAR

## 2023-12-22 NOTE — ED Provider Notes (Signed)
 [facility] Provider Note    Event Date/Time   First MD Initiated Contact with Patient 12/22/23 1009     (approximate)   History   Cough   HPI  Tracy Medina is a 33 y.o. female who presents today for evaluation of cough for the past week.  Patient reports that she has been seen by the virtual urgent care and has been started on prednisone , azithromycin , and has been using her nebulizer.  She reports that she continues to have a cough.  She was also prescribed Tessalon  Perles which were not helpful for her.  She denies having chest pain.  No history of PE or DVT.  No lower extremity swelling.  She is requesting a shot of steroids.  Patient Active Problem List   Diagnosis Date Noted   Nausea and vomiting 09/06/2023   Chronic midline low back pain without sciatica 12/12/2022   Class 3 severe obesity due to excess calories with body mass index (BMI) of 40.0 to 44.9 in adult [facility]) 05/30/2021   Insomnia 05/24/2020   Iron deficiency 02/29/2020   Anxiety and depression 02/29/2020   Asthma 02/29/2020          Physical Exam   Triage Vital Signs: ED Triage Vitals  Encounter Vitals Group     BP 12/22/23 0935 139/80     Girls Systolic BP Percentile --      Girls Diastolic BP Percentile --      Boys Systolic BP Percentile --      Boys Diastolic BP Percentile --      Pulse Rate 12/22/23 0935 95     Resp 12/22/23 0935 (!) 21     Temp 12/22/23 0935 98 F (36.7 C)     Temp src --      SpO2 12/22/23 0935 97 %     Weight 12/22/23 0934 285 lb (129.3 kg)     Height 12/22/23 0934 5' 7 (1.702 m)     Head Circumference --      Peak Flow --      Pain Score 12/22/23 0934 8     Pain Loc --      Pain Education --      Exclude from Growth Chart --     Most recent vital signs: Vitals:   12/22/23 0935  BP: 139/80  Pulse: 95  Resp: (!) 21  Temp: 98 F (36.7 C)  SpO2: 97%    Physical Exam Vitals and nursing note reviewed.  Constitutional:       General: Awake and alert. No acute distress.    Appearance: Normal appearance.  HENT:     Head: Normocephalic and atraumatic.     Mouth: Mucous membranes are moist.  Eyes:     General: PERRL. Normal EOMs        Right eye: No discharge.        Left eye: No discharge.     Conjunctiva/sclera: Conjunctivae normal.  Cardiovascular:     Rate and Rhythm: Normal rate and regular rhythm.     Pulses: Normal pulses.  Pulmonary:     Effort: Pulmonary effort is normal. No respiratory distress.  Dry cough on exam.  Able to speak easily in complete sentences.  No accessory muscle use.    Breath sounds: Normal breath sounds.  Abdominal:     Abdomen is soft. There is no abdominal tenderness. No rebound or guarding. No distention. Musculoskeletal:        General: No swelling.  Normal range of motion.     Cervical back: Normal range of motion and neck supple.  No lower extremity swelling. Skin:    General: Skin is warm and dry.     Capillary Refill: Capillary refill takes less than 2 seconds.     Findings: No rash.  Neurological:     Mental Status: The patient is awake and alert.      ED Results / Procedures / Treatments   Labs (all labs ordered are listed, but only abnormal results are displayed) Labs Reviewed  RESP PANEL BY RT-PCR (RSV, FLU A&B, COVID)  [id number]     EKG     RADIOLOGY I independently reviewed and interpreted imaging and agree with radiologists findings.     PROCEDURES:  Critical Care performed:   Procedures   MEDICATIONS ORDERED IN ED: Medications  dexamethasone  (DECADRON ) injection 10 mg (10 mg Intramuscular Given 12/22/23 1102)     IMPRESSION / MDM / ASSESSMENT AND PLAN / ED COURSE  I reviewed the triage vital signs and the nursing notes.   Differential diagnosis includes, but is not limited to, bronchitis, pneumonia, asthma exacerbation, reactive airways.  Patient is awake and alert, normal oxygen saturation on room air of 97%, no tachypnea or  accessory muscle use, and able to speak easily in complete sentences.  She is afebrile without taking antipyretics  Chest x-ray obtained in triage is negative for any acute findings.  COVID/flu/RSV swab is negative.  Symptoms of dry cough and nasal congestion are suggestive of viral URI.  Patient is already on prednisone , nebulizers, Tessalon  Perles, and azithromycin .  Recommended that she continue these as prescribed.  She requested a shot of steroids so she was given a shot of Decadron .  We discussed symptomatic management, rest, hydration, and return precautions.  Patient understands and agrees with plan.  Discharged in stable condition.   Patient's presentation is most consistent with acute complicated illness / injury requiring diagnostic workup.    FINAL CLINICAL IMPRESSION(S) / ED DIAGNOSES   Final diagnoses:  Viral URI with cough     Rx / DC Orders   ED Discharge Orders     None        Note:  This document was prepared using Dragon voice recognition software and may include unintentional dictation errors.   Dereck Agerton E, PA-C 12/22/23 1645    Clarine Ozell LABOR, MD 12/24/23 [phone]

## 2023-12-22 NOTE — ED Notes (Signed)
 Pt taken to Xray and they will bring to 43 afterwards

## 2023-12-22 NOTE — ED Triage Notes (Addendum)
 Pt comes with cough and congestion for over week. Pt was placed on meds and still taking them. Pt does have hx of asthma. Pt has used neb at home with no relief. Pt states green yellow mucus

## 2023-12-22 NOTE — Discharge Instructions (Signed)
 Continue to take your prednisone , antibiotics, cough medicine, and use your nebulizer as needed.  Your oxygen saturation is normal today.  Your chest x-ray does not reveal evidence of pneumonia.  Your COVID/flu/RSV swab is negative.  Your symptoms are most likely due to another viral etiology.  Please follow-up with your outpatient provider.  Please return for any new, worsening, or changing symptoms or other concerns.  It was a pleasure caring for you today.

## 2023-12-22 NOTE — ED Notes (Signed)
 Pt verbalizes understanding of discharge instructions. Opportunity for questioning and answers were provided. Pt discharged from ED to home.   ? ?

## 2023-12-22 NOTE — ED Notes (Signed)
 See triage note  Presents with cough and some SOB  States she developed sxs' about 2 weeks ago Has been on prednisone  and z-pak Along with SVN treatments Having some discomfort in chest with cough and breathing

## 2023-12-23 ENCOUNTER — Telehealth: Admitting: Internal Medicine

## 2023-12-23 ENCOUNTER — Encounter: Payer: Self-pay | Admitting: Internal Medicine

## 2023-12-23 ENCOUNTER — Telehealth: Payer: Self-pay

## 2023-12-23 DIAGNOSIS — J4531 Mild persistent asthma with (acute) exacerbation: Secondary | ICD-10-CM

## 2023-12-23 DIAGNOSIS — M545 Low back pain, unspecified: Secondary | ICD-10-CM | POA: Diagnosis not present

## 2023-12-23 DIAGNOSIS — J069 Acute upper respiratory infection, unspecified: Secondary | ICD-10-CM

## 2023-12-23 MED ORDER — LEVALBUTEROL HCL 1.25 MG/3ML IN NEBU: 1.2500 mg | INHALATION_SOLUTION | RESPIRATORY_TRACT | 2 refills | Status: AC | PRN

## 2023-12-23 MED ORDER — HYDROCODONE BIT-HOMATROP MBR 5-1.5 MG/5ML PO SOLN: 5.0000 mL | Freq: Three times a day (TID) | ORAL | 0 refills | Status: DC | PRN

## 2023-12-23 NOTE — Progress Notes (Signed)
 Virtual Visit via Video Note  I connected with Tracy Medina on 12/23/23 at  2:00 PM EST by a video enabled telemedicine application and verified that I am speaking with the correct person using two identifiers.  Location: Patient: Home Provider: Office  Person's participating in this video call: Angeline Laura, NP-C and Makiya Jeune   I discussed the limitations of evaluation and management by telemedicine and the availability of in person appointments. The patient expressed understanding and agreed to proceed.  History of Present Illness:   Discussed the use of AI scribe software for clinical note transcription with the patient, who gave verbal consent to proceed.  Tracy Medina is a 33 year old female with asthma who presents with persistent respiratory symptoms. She is accompanied by her mother.  She has been experiencing respiratory symptoms for the past two weeks. Initially, she had two e-visits where she was prescribed prednisone , azithromycin , tessalon  perles, and albuterol . Despite these treatments, her symptoms persisted, leading to an ER visit yesterday. During the ER visit, a respiratory panel was conducted and returned negative, and a chest x-ray showed no signs of pneumonia. She received a decadron  shot during this visit. She notes that the albuterol  provided was not as effective as a previous levalbuterol  vial she had used.   She has been taking cough medicine with codeine , which is nearly finished, and using tessalon  perles as needed. She takes the cough medicine primarily before bed and avoids taking it simultaneously with Tessalon  Perles.  She has been using mucinex  and sudafed but is considering discontinuing them due to her current medication regimen, which includes prednisone . Her prednisone  was initially prescribed at 20 mg daily, and she is now on a tapering dose of 10 mg.  She has been experiencing back pain and has been coughing.      Past Medical  History:  Diagnosis Date   Anxiety    Arthritis    Asthma    Depression    Endometriosis     Current Outpatient Medications  Medication Sig Dispense Refill   albuterol  (VENTOLIN  HFA) 108 (90 Base) MCG/ACT inhaler TAKE 2 PUFFS BY MOUTH EVERY 6 HOURS AS NEEDED FOR WHEEZE OR SHORTNESS OF BREATH 8.5 each 0   benzonatate  (TESSALON ) 200 MG capsule Take 1 capsule (200 mg total) by mouth 3 (three) times daily as needed. 30 capsule 0   busPIRone  (BUSPAR ) 15 MG tablet Take 1 tablet (15 mg total) by mouth 3 (three) times daily. 270 tablet 0   cyclobenzaprine  (FLEXERIL ) 10 MG tablet Take 1 tablet (10 mg total) by mouth 2 (two) times daily as needed for muscle spasms. 20 tablet 0   eszopiclone  (LUNESTA ) 2 MG TABS tablet Take 1 tablet (2 mg total) by mouth at bedtime as needed for sleep. Take immediately before bedtime 30 tablet 0   FLUoxetine  (PROZAC ) 40 MG capsule Take 1 capsule (40 mg total) by mouth daily. 90 capsule 0   fluticasone  (FLONASE ) 50 MCG/ACT nasal spray Place 2 sprays into both nostrils daily.     fluticasone  (FLOVENT  HFA) 110 MCG/ACT inhaler Inhale 1 puff into the lungs in the morning and at bedtime. 1 each 5   ipratropium-albuterol  (DUONEB) 0.5-2.5 (3) MG/3ML SOLN TAKE 3 MLS BY NEBULIZATION EVERY 8 (EIGHT) HOURS AS NEEDED. 360 mL 0   ondansetron  (ZOFRAN -ODT) 4 MG disintegrating tablet Take 1 tablet (4 mg total) by mouth every 8 (eight) hours as needed for nausea or vomiting. 30 tablet 0   tirzepatide  (MOUNJARO ) 5 MG/0.5ML Pen Inject  5 mg into the skin once a week. (Patient not taking: Reported on 10/24/2023) 6 mL 5   No current facility-administered medications for this visit.    Allergies  Allergen Reactions   Cefaclor Hives and Rash   Escitalopram Oxalate     Rage and  insomnia   Prednisone  Other (See Comments)    Increases sx of depression   Ascorbic Acid     Family History  Adopted: Yes  Problem Relation Age of Onset   Hypertension Mother     Social History    Socioeconomic History   Marital status: Single    Spouse name: Not on file   Number of children: Not on file   Years of education: Not on file   Highest education level: Some college, no degree  Occupational History   Not on file  Tobacco Use   Smoking status: Never   Smokeless tobacco: Never  Vaping Use   Vaping status: Never Used  Substance and Sexual Activity   Alcohol use: Yes    Alcohol/week: 2.0 standard drinks of alcohol    Types: 1 Cans of beer, 1 Shots of liquor per week    Comment: social   Drug use: Never   Sexual activity: Not Currently    Birth control/protection: Surgical    Comment: hysterectomy  Other Topics Concern   Not on file  Social History Narrative   Not on file   Social Drivers of Health   Financial Resource Strain: Low Risk  (01/29/2023)   Overall Financial Resource Strain (CARDIA)    Difficulty of Paying Living Expenses: Not very hard  Food Insecurity: No Food Insecurity (01/29/2023)   Hunger Vital Sign    Worried About Running Out of Food in the Last Year: Never true    Ran Out of Food in the Last Year: Never true  Transportation Needs: No Transportation Needs (01/29/2023)   PRAPARE - Administrator, Civil Service (Medical): No    Lack of Transportation (Non-Medical): No  Physical Activity: Insufficiently Active (01/29/2023)   Exercise Vital Sign    Days of Exercise per Week: 3 days    Minutes of Exercise per Session: 20 min  Stress: No Stress Concern Present (01/29/2023)   [employer] of Occupational Health - Occupational Stress Questionnaire    Feeling of Stress : Not at all  Social Connections: Moderately Isolated (01/29/2023)   Social Connection and Isolation Panel    Frequency of Communication with Friends and Family: More than three times a week    Frequency of Social Gatherings with Friends and Family: Once a week    Attends Religious Services: More than 4 times per year    Active Member of [employer] or Organizations: No     Attends Banker Meetings: Patient declined    Marital Status: Never married  Intimate Partner Violence: Patient Declined (12/12/2022)   Humiliation, Afraid, Rape, and Kick questionnaire    Fear of Current or Ex-Partner: Patient declined    Emotionally Abused: Patient declined    Physically Abused: Patient declined    Sexually Abused: Patient declined     Constitutional: Denies fever, malaise, fatigue, headache or abrupt weight changes.  HEENT: Patient reports hoarseness.  Denies eye pain, eye redness, ear pain, ringing in the ears, wax buildup, runny nose, nasal congestion, bloody nose, or sore throat. Respiratory: Patient reports cough and shortness of breath.  Denies difficulty breathing.   Cardiovascular: Denies chest pain, chest tightness, palpitations or swelling in the  hands or feet.  Musculoskeletal: Patient reports low back pain.  Denies decrease in range of motion, difficulty with gait, or joint swelling.  Skin: Denies redness, rashes, lesions or ulcercations.  Neurological: Denies dizziness, difficulty with memory, difficulty with speech or problems with balance and coordination.    No other specific complaints in a complete review of systems (except as listed in HPI above).  Observations/Objective:  LMP 07/23/2021 (Approximate)  Wt Readings from Last 3 Encounters:  12/22/23 285 lb (129.3 kg)  10/24/23 286 lb 6.4 oz (129.9 kg)  09/06/23 274 lb 14.6 oz (124.7 kg)    General: Appears her  stated age, appears unwell but in NAD. HEENT: Head: normal shape and size;  Nose: No congestion noted; Throat/Mouth: Hoarseness noted Pulmonary/Chest: Normal effort.  Dry coughing noted.  No respiratory distress.  MSK: Unable to assess via video visit Neurological: Alert and oriented.   BMET    Component Value Date/Time   NA 139 07/18/2023 0900   K 4.9 07/18/2023 0900   CL 104 07/18/2023 0900   CO2 28 07/18/2023 0900   GLUCOSE 86 07/18/2023 0900   BUN 8 07/18/2023  0900   CREATININE 0.91 07/18/2023 0900   CALCIUM 9.6 07/18/2023 0900   GFRNONAA >60 12/17/2021 0854    Lipid Panel     Component Value Date/Time   CHOL 132 07/18/2023 0900   TRIG 77 07/18/2023 0900   HDL 48 (L) 07/18/2023 0900   CHOLHDL 2.8 07/18/2023 0900   LDLCALC 68 07/18/2023 0900    CBC    Component Value Date/Time   WBC 8.8 07/18/2023 0900   RBC 4.66 07/18/2023 0900   HGB 14.0 07/18/2023 0900   HCT 42.3 07/18/2023 0900   PLT 364 07/18/2023 0900   MCV 90.8 07/18/2023 0900   MCH 30.0 07/18/2023 0900   MCHC 33.1 07/18/2023 0900   RDW 13.0 07/18/2023 0900   LYMPHSABS 2.4 06/10/2021 1933   MONOABS 0.7 06/10/2021 1933   EOSABS 0.2 06/10/2021 1933   BASOSABS 0.1 06/10/2021 1933    Hgb A1C Lab Results  Component Value Date   HGBA1C 5.2 07/18/2023       Assessment and Plan:  Assessment and Plan    ER follow-up for acute exacerbation of mild persistent asthma, viral URI with cough ER notes, labs and imaging reviewed exacerbation. Negative respiratory panel and chest x-ray ruled out pneumonia. Levalbuterol  preferred for nebulizer. - Prescribed levalbuterol  1.25 mg for nebulizer every four hours as needed. - Continue prednisone  taper until course completed. - No indication for additional antibiotics. - Prescribed Hycodan syrup every eight hours, primarily before bed. - Advised Tessalon  Perles every eight hours as needed, not with Hycodan. - Recommended hot liquids for laryngitis. - Advised discontinuation of Mucinex  and Sudafed.  Low back pain Pain likely exacerbated by coughing. Current management includes lidocaine  patch and Tylenol . Flexeril  considered for muscle relaxation. - Continue lidocaine  patch. - Prescribed Tylenol  1000 mg every eight hours. - Advised against diclofenac  use. - Recommend Flexeril  10 mg as previously prescribed, ensuring two-hour gap with cough syrup.       RTC in 2 months for followup chronic conditions  Follow Up  Instructions:    I discussed the assessment and treatment plan with the patient. The patient was provided an opportunity to ask questions and all were answered. The patient agreed with the plan and demonstrated an understanding of the instructions.   The patient was advised to call back or seek an in-person evaluation if the symptoms worsen  or if the condition fails to improve as anticipated.   Angeline Laura, NP

## 2023-12-23 NOTE — Telephone Encounter (Unsigned)
 Copied from CRM #[id number]. Topic: Clinical - Prescription Issue >> Dec 23, 2023  2:42 PM Rosaria E wrote: Reason for CRM: Pt's mother called and reported that the [facility] where the pt's medication has been sent reports that they have not received anything.   Pt's mother wants someone to call and resolve this matter.

## 2023-12-23 NOTE — Patient Instructions (Signed)

## 2023-12-24 NOTE — Telephone Encounter (Signed)
 Spoke with Tracy Medina patients mother. She picked up patients medications yesterday.

## 2023-12-25 ENCOUNTER — Other Ambulatory Visit: Payer: Self-pay

## 2023-12-25 ENCOUNTER — Emergency Department

## 2023-12-25 ENCOUNTER — Observation Stay
Admission: EM | Admit: 2023-12-25 | Discharge: 2023-12-28 | Disposition: A | Attending: Internal Medicine | Admitting: Internal Medicine

## 2023-12-25 DIAGNOSIS — R6510 Systemic inflammatory response syndrome (SIRS) of non-infectious origin without acute organ dysfunction: Secondary | ICD-10-CM | POA: Insufficient documentation

## 2023-12-25 DIAGNOSIS — J189 Pneumonia, unspecified organism: Secondary | ICD-10-CM | POA: Insufficient documentation

## 2023-12-25 DIAGNOSIS — R0602 Shortness of breath: Secondary | ICD-10-CM | POA: Diagnosis not present

## 2023-12-25 DIAGNOSIS — J45909 Unspecified asthma, uncomplicated: Secondary | ICD-10-CM | POA: Diagnosis not present

## 2023-12-25 DIAGNOSIS — M545 Low back pain, unspecified: Secondary | ICD-10-CM | POA: Insufficient documentation

## 2023-12-25 DIAGNOSIS — R918 Other nonspecific abnormal finding of lung field: Secondary | ICD-10-CM | POA: Diagnosis not present

## 2023-12-25 DIAGNOSIS — D72829 Elevated white blood cell count, unspecified: Secondary | ICD-10-CM | POA: Insufficient documentation

## 2023-12-25 DIAGNOSIS — J4541 Moderate persistent asthma with (acute) exacerbation: Secondary | ICD-10-CM | POA: Insufficient documentation

## 2023-12-25 DIAGNOSIS — R0902 Hypoxemia: Secondary | ICD-10-CM | POA: Diagnosis not present

## 2023-12-25 DIAGNOSIS — R069 Unspecified abnormalities of breathing: Secondary | ICD-10-CM | POA: Diagnosis not present

## 2023-12-25 DIAGNOSIS — J441 Chronic obstructive pulmonary disease with (acute) exacerbation: Principal | ICD-10-CM | POA: Insufficient documentation

## 2023-12-25 DIAGNOSIS — Z743 Need for continuous supervision: Secondary | ICD-10-CM | POA: Diagnosis not present

## 2023-12-25 DIAGNOSIS — R061 Stridor: Secondary | ICD-10-CM | POA: Diagnosis not present

## 2023-12-25 DIAGNOSIS — F419 Anxiety disorder, unspecified: Secondary | ICD-10-CM | POA: Insufficient documentation

## 2023-12-25 DIAGNOSIS — R7402 Elevation of levels of lactic acid dehydrogenase (LDH): Secondary | ICD-10-CM | POA: Insufficient documentation

## 2023-12-25 DIAGNOSIS — Z79899 Other long term (current) drug therapy: Secondary | ICD-10-CM | POA: Insufficient documentation

## 2023-12-25 DIAGNOSIS — J45901 Unspecified asthma with (acute) exacerbation: Principal | ICD-10-CM | POA: Diagnosis present

## 2023-12-25 DIAGNOSIS — R7989 Other specified abnormal findings of blood chemistry: Secondary | ICD-10-CM | POA: Diagnosis not present

## 2023-12-25 DIAGNOSIS — F32A Depression, unspecified: Secondary | ICD-10-CM | POA: Insufficient documentation

## 2023-12-25 DIAGNOSIS — R079 Chest pain, unspecified: Secondary | ICD-10-CM | POA: Diagnosis not present

## 2023-12-25 DIAGNOSIS — R0689 Other abnormalities of breathing: Secondary | ICD-10-CM | POA: Diagnosis not present

## 2023-12-25 DIAGNOSIS — R000 Tachycardia, unspecified: Secondary | ICD-10-CM | POA: Insufficient documentation

## 2023-12-25 LAB — CBC WITH DIFFERENTIAL/PLATELET
Abs Immature Granulocytes: 0.67 K/uL — ABNORMAL HIGH (ref 0.00–0.07)
Basophils Absolute: 0.1 K/uL (ref 0.0–0.1)
Basophils Relative: 0 %
Eosinophils Absolute: 0 K/uL (ref 0.0–0.5)
Eosinophils Relative: 0 %
HCT: 41.4 % (ref 36.0–46.0)
Hemoglobin: 13.7 g/dL (ref 12.0–15.0)
Immature Granulocytes: 4 %
Lymphocytes Relative: 11 %
Lymphs Abs: 2.2 K/uL (ref 0.7–4.0)
MCH: 29.5 pg (ref 26.0–34.0)
MCHC: 33.1 g/dL (ref 30.0–36.0)
MCV: 89 fL (ref 80.0–100.0)
Monocytes Absolute: 0.9 K/uL (ref 0.1–1.0)
Monocytes Relative: 5 %
Neutro Abs: 15.5 K/uL — ABNORMAL HIGH (ref 1.7–7.7)
Neutrophils Relative %: 80 %
Platelets: 393 K/uL (ref 150–400)
RBC: 4.65 MIL/uL (ref 3.87–5.11)
RDW: 13.9 % (ref 11.5–15.5)
WBC: 19.3 K/uL — ABNORMAL HIGH (ref 4.0–10.5)
nRBC: 0 % (ref 0.0–0.2)

## 2023-12-25 LAB — COMPREHENSIVE METABOLIC PANEL WITH GFR
ALT: 33 U/L (ref 0–44)
AST: 22 U/L (ref 15–41)
Albumin: 4.4 g/dL (ref 3.5–5.0)
Alkaline Phosphatase: 109 U/L (ref 38–126)
Anion gap: 12 (ref 5–15)
BUN: 14 mg/dL (ref 6–20)
CO2: 26 mmol/L (ref 22–32)
Calcium: 9.4 mg/dL (ref 8.9–10.3)
Chloride: 98 mmol/L (ref 98–111)
Creatinine, Ser: 0.76 mg/dL (ref 0.44–1.00)
GFR, Estimated: >60 mL/min (ref >60)
Glucose, Bld: 132 mg/dL — ABNORMAL HIGH (ref 70–99)
Potassium: 4.2 mmol/L (ref 3.5–5.1)
Sodium: 136 mmol/L (ref 135–145)
Total Bilirubin: 0.3 mg/dL (ref 0.0–1.2)
Total Protein: 7.6 g/dL (ref 6.5–8.1)

## 2023-12-25 LAB — TROPONIN T, HIGH SENSITIVITY
Troponin T High Sensitivity: <15 ng/L (ref 0–19)
Troponin T High Sensitivity: <15 ng/L (ref 0–19)

## 2023-12-25 LAB — D-DIMER, QUANTITATIVE: D-Dimer, Quant: 1.21 ug{FEU}/mL — ABNORMAL HIGH (ref 0.00–0.50)

## 2023-12-25 LAB — LACTIC ACID, PLASMA: Lactic Acid, Venous: 2.3 mmol/L (ref 0.5–1.9)

## 2023-12-25 LAB — POC URINE PREG, ED: Preg Test, Ur: NEGATIVE

## 2023-12-25 LAB — PROCALCITONIN: Procalcitonin: <0.1 ng/mL

## 2023-12-25 MED ORDER — RACEPINEPHRINE HCL 2.25 % IN NEBU
0.5000 mL | INHALATION_SOLUTION | Freq: Once | RESPIRATORY_TRACT | Status: AC
  Administered 2023-12-25: 0.5 mL via RESPIRATORY_TRACT
  Filled 2023-12-25: qty 15

## 2023-12-25 MED ORDER — ALBUTEROL SULFATE (2.5 MG/3ML) 0.083% IN NEBU
2.5000 mg | INHALATION_SOLUTION | Freq: Four times a day (QID) | RESPIRATORY_TRACT | Status: DC
  Administered 2023-12-25 – 2023-12-26 (×2): 2.5 mg via RESPIRATORY_TRACT
  Filled 2023-12-25 (×2): qty 3

## 2023-12-25 MED ORDER — METHOCARBAMOL 1000 MG/10ML IJ SOLN: 500.0000 mg | Freq: Four times a day (QID) | INTRAMUSCULAR | Status: DC | PRN

## 2023-12-25 MED ORDER — IPRATROPIUM-ALBUTEROL 0.5-2.5 (3) MG/3ML IN SOLN
3.0000 mL | Freq: Four times a day (QID) | RESPIRATORY_TRACT | Status: DC | PRN
  Administered 2023-12-26 – 2023-12-27 (×3): 3 mL via RESPIRATORY_TRACT
  Filled 2023-12-25 (×4): qty 3

## 2023-12-25 MED ORDER — IOHEXOL 350 MG/ML SOLN
100.0000 mL | Freq: Once | INTRAVENOUS | Status: AC | PRN
  Administered 2023-12-25: 100 mL via INTRAVENOUS

## 2023-12-25 MED ORDER — BUDESONIDE 0.5 MG/2ML IN SUSP
0.5000 mg | Freq: Two times a day (BID) | RESPIRATORY_TRACT | Status: DC
  Administered 2023-12-25 – 2023-12-28 (×6): 0.5 mg via RESPIRATORY_TRACT
  Filled 2023-12-25 (×6): qty 2

## 2023-12-25 MED ORDER — SODIUM CHLORIDE 0.9 % IV SOLN
2.0000 g | Freq: Once | INTRAVENOUS | Status: AC
  Administered 2023-12-25: 2 g via INTRAVENOUS
  Filled 2023-12-25: qty 20

## 2023-12-25 MED ORDER — ONDANSETRON HCL 4 MG/2ML IJ SOLN: 4.0000 mg | Freq: Four times a day (QID) | INTRAMUSCULAR | Status: DC | PRN

## 2023-12-25 MED ORDER — METHYLPREDNISOLONE SODIUM SUCC 40 MG IJ SOLR
40.0000 mg | Freq: Two times a day (BID) | INTRAMUSCULAR | Status: AC
  Administered 2023-12-25 – 2023-12-26 (×2): 40 mg via INTRAVENOUS
  Filled 2023-12-25 (×2): qty 1

## 2023-12-25 MED ORDER — HYDROCODONE BIT-HOMATROP MBR 5-1.5 MG/5ML PO SOLN
5.0000 mL | Freq: Three times a day (TID) | ORAL | Status: DC | PRN
  Administered 2023-12-26 – 2023-12-28 (×6): 5 mL via ORAL
  Filled 2023-12-25 (×7): qty 5

## 2023-12-25 MED ORDER — ACETAMINOPHEN 650 MG RE SUPP: 650.0000 mg | Freq: Four times a day (QID) | RECTAL | Status: DC | PRN

## 2023-12-25 MED ORDER — SODIUM CHLORIDE 0.9 % IV SOLN
1.0000 g | INTRAVENOUS | Status: DC
  Administered 2023-12-26 – 2023-12-27 (×2): 1 g via INTRAVENOUS
  Filled 2023-12-25 (×3): qty 10

## 2023-12-25 MED ORDER — LACTATED RINGERS IV SOLN: INTRAVENOUS | Status: DC

## 2023-12-25 MED ORDER — HYDROCOD POLI-CHLORPHE POLI ER 10-8 MG/5ML PO SUER
5.0000 mL | Freq: Once | ORAL | Status: AC
  Administered 2023-12-25: 5 mL via ORAL
  Filled 2023-12-25: qty 5

## 2023-12-25 MED ORDER — IPRATROPIUM-ALBUTEROL 0.5-2.5 (3) MG/3ML IN SOLN
3.0000 mL | Freq: Once | RESPIRATORY_TRACT | Status: AC
  Administered 2023-12-25: 3 mL via RESPIRATORY_TRACT
  Filled 2023-12-25: qty 3

## 2023-12-25 MED ORDER — SODIUM CHLORIDE 0.9 % IV SOLN
100.0000 mg | Freq: Once | INTRAVENOUS | Status: AC
  Administered 2023-12-25: 100 mg via INTRAVENOUS
  Filled 2023-12-25: qty 100

## 2023-12-25 MED ORDER — ONDANSETRON HCL 4 MG PO TABS: 4.0000 mg | ORAL_TABLET | Freq: Four times a day (QID) | ORAL | Status: DC | PRN

## 2023-12-25 MED ORDER — IPRATROPIUM-ALBUTEROL 0.5-2.5 (3) MG/3ML IN SOLN
6.0000 mL | Freq: Once | RESPIRATORY_TRACT | Status: AC
  Administered 2023-12-25: 6 mL via RESPIRATORY_TRACT
  Filled 2023-12-25: qty 3

## 2023-12-25 MED ORDER — ACETAMINOPHEN 325 MG PO TABS
650.0000 mg | ORAL_TABLET | Freq: Four times a day (QID) | ORAL | Status: DC | PRN
  Administered 2023-12-26: 650 mg via ORAL
  Filled 2023-12-25: qty 2

## 2023-12-25 NOTE — ED Provider Notes (Signed)
 [facility] Provider Note    Event Date/Time   First MD Initiated Contact with Patient 12/25/23 1248     (approximate)   History   Shortness of Breath   HPI  Tracy Medina is a 33 year old female with history of asthma presenting to the emergency department for evaluation of shortness of breath.  Patient reports that over the past 1 to 2 weeks she has had ongoing cough and shortness of breath.  Seen in virtual urgent care, or ER, by her primary care doctor for this.  Has been using steroids, her inhaler, cough syrup but has continued to have ongoing symptoms.  This morning had significantly worsening symptoms.  EMS was called.  Patient had sats in the mid 90s but was noted to have significantly labored breathing.  She was given Solu-Medrol , nebulizer treatments, 0.5 mg of epi, 2 g of magnesium.  On presentation here, patient reports feeling significantly improved.     Physical Exam   Triage Vital Signs: ED Triage Vitals  Encounter Vitals Group     BP 12/25/23 1251 (!) 143/129     Girls Systolic BP Percentile --      Girls Diastolic BP Percentile --      Boys Systolic BP Percentile --      Boys Diastolic BP Percentile --      Pulse Rate 12/25/23 1251 93     Resp 12/25/23 1255 19     Temp 12/25/23 1255 98.1 F (36.7 C)     Temp src --      SpO2 12/25/23 1255 99 %     Weight 12/25/23 1252 285 lb (129.3 kg)     Height 12/25/23 1252 5' 7 (1.702 m)     Head Circumference --      Peak Flow --      Pain Score 12/25/23 1252 4     Pain Loc --      Pain Education --      Exclude from Growth Chart --     Most recent vital signs: Vitals:   12/25/23 1251 12/25/23 1255  BP: (!) 143/129 (!) 140/56  Pulse: 93 93  Resp:  19  Temp:  98.1 F (36.7 C)  SpO2:  99%     General: Awake, interactive  CV:  Good peripheral perfusion Resp:  Mildly labored respirations, shallow, no significant appreciable wheezing Abd:  Nondistended.  Neuro:  Symmetric  facial movement, fluid speech   ED Results / Procedures / Treatments   Labs (all labs ordered are listed, but only abnormal results are displayed) Labs Reviewed  CBC WITH DIFFERENTIAL/PLATELET - Abnormal; Notable for the following components:      Result Value   WBC 19.3 (*)    Neutro Abs 15.5 (*)    Abs Immature Granulocytes 0.67 (*)    All other components within normal limits  COMPREHENSIVE METABOLIC PANEL WITH GFR - Abnormal; Notable for the following components:   Glucose, Bld 132 (*)    All other components within normal limits  D-DIMER, QUANTITATIVE - Abnormal; Notable for the following components:   D-Dimer, Quant 1.21 (*)    All other components within normal limits  POC URINE PREG, ED  TROPONIN T, HIGH SENSITIVITY  TROPONIN T, HIGH SENSITIVITY     EKG EKG independently reviewed and interpreted by myself demonstrates:  EKG demonstrate sinus rhythm at a rate of 93, PR 142, QRS 104, QTc 427, no acute ST changes  RADIOLOGY Imaging independently reviewed and interpreted  by myself demonstrates:   CXR without focal consolidation  Formal Radiology Read:  DG Chest Portable 1 View Result Date: 12/25/2023 CLINICAL DATA:  Shortness of breath.  Asthma. EXAM: PORTABLE CHEST 1 VIEW COMPARISON:  12/22/2023 FINDINGS: Heart size is within normal limits for technique. Mediastinal contours are within normal limits. Both lungs are clear. No pleural effusion or pneumothorax. No acute osseous abnormality. IMPRESSION: No acute cardiopulmonary findings. Electronically Signed   By: Harrietta Sherry M.D.   On: 12/25/2023 13:54    PROCEDURES:  Critical Care performed: Yes, see critical care procedure note(s)  CRITICAL CARE Performed by: Nilsa Dade   Total critical care time: 32 minutes  Critical care time was exclusive of separately billable procedures and treating other patients.  Critical care was necessary to treat or prevent imminent or life-threatening deterioration.  Critical  care was time spent personally by me on the following activities: development of treatment plan with patient and/or surrogate as well as nursing, discussions with consultants, evaluation of patient's response to treatment, examination of patient, obtaining history from patient or surrogate, ordering and performing treatments and interventions, ordering and review of laboratory studies, ordering and review of radiographic studies, pulse oximetry and re-evaluation of patient's condition.   Procedures   MEDICATIONS ORDERED IN ED: Medications  Racepinephrine HCl 2.25 % nebulizer solution 0.5 mL (0.5 mLs Nebulization Given 12/25/23 1347)  ipratropium-albuterol  (DUONEB) 0.5-2.5 (3) MG/3ML nebulizer solution 3 mL (3 mLs Nebulization Given 12/25/23 1432)     IMPRESSION / MDM / ASSESSMENT AND PLAN / ED COURSE  I reviewed the triage vital signs and the nursing notes.  Differential diagnosis includes, but is not limited to, asthma exacerbation, pneumonia, viral illness, lower suspicion for consideration for PE, ACS  Patient's presentation is most consistent with acute presentation with potential threat to life or bodily function.  33 year old female presenting with difficulty breathing, received multiple treatments with EMS prior to presentation with improved but not completely resolved symptoms here.  Will obtain labs, EKG, x-Shatira Dobosz to further evaluate.  Clinical Course as of 12/25/23 1530  Wed Dec 25, 2023  1336 D-dimer, quantitative(!) D-dimer elevated at 1.21.  CTA ordered [NR]  1345 Patient reassessed and reporting worsening difficulty breathing.  Lungs with fair air movement without clear wheezing but is noted to have tight upper airway sounds with possible stridor.  Will trial racemic epinephrine. [NR]  1413 Reassessed after racemic epinephrine and patient with significantly improved work of breathing.  Pending CTA of the chest, but given the degree and recurrence of her symptoms, do think she will  be appropriate for admission. [NR]  1529 Signed out to oncoming physician pending CT and disposition. [NR]    Clinical Course User Index [NR] Dade Nilsa, MD     FINAL CLINICAL IMPRESSION(S) / ED DIAGNOSES   Final diagnoses:  Exacerbation of asthma, unspecified asthma severity, unspecified whether persistent  Shortness of breath     Rx / DC Orders   ED Discharge Orders     None        Note:  This document was prepared using Dragon voice recognition software and may include unintentional dictation errors.   Dade Nilsa, MD 12/25/23 [phone]

## 2023-12-25 NOTE — ED Triage Notes (Signed)
 Pt comes in from home via [facility] with complaints of a severe asthma attack. Pt was seen in the ER on Sunday for an asthma attack. Pt with an unproductive cough the past 2 weeks. Pt with an O2 saturation of 95% RA on EMS arrival. Pt given 0.5 Epi, 2g mag in 100mL D5,  and 125 solumedrol. Pt took albuterol  treatment at home with no relief. Pt complains of chest tightness 4/10 at this time. Pt alert and oriented x4. Pt has a history of anxiety and depression, and voices concerns about her depression, but denies SI/HI.

## 2023-12-25 NOTE — ED Provider Notes (Signed)
 Patient received in signout from Dr. Levander.  Fairly severe asthma exacerbation, prehospital epinephrine, stridorous breath sounds requiring nebulized racemic epi as well.  Leukocytosis, patient has been on a Z-Pak but no other antibiotics recently.  Elevated D-dimer and patient signed out to me pending CTA chest.  No signs of PE but does have evidence of pneumonia.  Will add on sepsis screening protocols.   I consult with our pharmacist regarding antibiotics considering her documented allergy of cefaclor.  Due to dissimilar R chains, they think ceftriaxone would be safe considering this documented allergy.  I ordered this, doxycycline  IV and will consult medicine for admission.  .Critical Care  Performed by: Claudene Rover, MD Authorized by: Claudene Rover, MD   Critical care provider statement:    Critical care time (minutes):  30   Critical care time was exclusive of:  Separately billable procedures and treating other patients   Critical care was necessary to treat or prevent imminent or life-threatening deterioration of the following conditions:  Sepsis   Critical care was time spent personally by me on the following activities:  Development of treatment plan with patient or surrogate, discussions with consultants, evaluation of patient's response to treatment, examination of patient, ordering and review of laboratory studies, ordering and review of radiographic studies, ordering and performing treatments and interventions, pulse oximetry, re-evaluation of patient's condition and review of old charts     Claudene Rover, MD 12/25/23 2011

## 2023-12-25 NOTE — H&P (Signed)
 History and Physical    [name] FMW:[id number] DOB: Mar 22, 1990 DOA: 12/25/2023 DOS: the patient was seen and examined on 12/25/2023 PCP: Antonette Angeline ORN, NP  Patient coming from: Home    Chief Complaint:  Chief Complaint  Patient presents with   Shortness of Breath   HPI: Tracy Medina is a 33 y.o. female with medical history significant of asthma, anxiety, depression presents to the ED via EMS for evaluation of shortness of breath with associated wheezing and cough that is occasionally productive, that has been ongoing for the past month.  Shortness of breath is exacerbated with activity. She denies fevers but admits to chills and hot flashes, sore throat and hoarse voice from coughing.  No nausea, vomiting, diarrhea, headache, myalgias, nasal congestion. She says her asthma is typically exacerbated every year around this time, however her symptoms usually resolve with a prednisone  pack.  She has been seen at an Urgent care twice, and ED once for evaluation, and completed prednisone  and azithromycin  packs without any relief.SABRA Hint, she had a coughing fit so bad that she lost consciousness, and her mother called EMS.  On EMS arrival, she was saturating 95% on room air.  She was given epi, mag, Solu-Medrol  and route.  On ED arrival, she reported feeling significantly improved.  She was afebrile, saturating appropriately on room air.  She was hemodynamically stable. Troponins negative x2. EKG NSR without ischemic changes.  COVID flu and RSV were negative. CTA was negative for PE, however did reveal changes in the right upper and lower lobes consistent with infectious versus inflammatory etiology. She had a white count of 19.3.  CT soft tissue neck was negative for airway edema or masses  She was given DuoNebs and inhaled racemic epi, ceftriaxone and doxycycline , and blood cultures obtained.  Admit for further management of asthma exacerbation +/- pna.   Review of  Systems: Review of Systems  Constitutional:  Positive for chills. Negative for fever, malaise/fatigue and weight loss.  HENT:  Positive for sore throat. Negative for congestion and sinus pain.   Eyes:  Negative for blurred vision.  Respiratory:  Positive for cough, sputum production, shortness of breath and wheezing. Negative for hemoptysis.   Cardiovascular:  Negative for chest pain, palpitations and leg swelling.  Gastrointestinal:  Negative for abdominal pain, constipation, diarrhea, heartburn, nausea and vomiting.  Genitourinary:  Negative for dysuria and frequency.  Musculoskeletal:  Negative for back pain, falls, joint pain and myalgias.  Skin:  Negative for itching and rash.  Neurological:  Positive for loss of consciousness. Negative for dizziness, tingling, tremors, sensory change, speech change, focal weakness, seizures, weakness and headaches.  Psychiatric/Behavioral:  Positive for depression. Negative for substance abuse and suicidal ideas.     Past Medical History:  Diagnosis Date   Anxiety    Arthritis    Asthma    Depression    Endometriosis    Past Surgical History:  Procedure Laterality Date   ABDOMINAL HYSTERECTOMY     CHOLECYSTECTOMY     LAPAROSCOPY     TONSILLECTOMY AND ADENOIDECTOMY     TOTAL LAPAROSCOPIC HYSTERECTOMY WITH SALPINGECTOMY Bilateral 08/21/2021   Procedure: TOTAL LAPAROSCOPIC HYSTERECTOMY WITH SALPINGECTOMY;  Surgeon: Janit Alm Agent, MD;  Location: [facility] ORS;  Service: Gynecology;  Laterality: Bilateral;   TYMPANOSTOMY TUBE PLACEMENT     Social History:  reports that she has never smoked. She has never used smokeless tobacco. She reports current alcohol use of about 2.0 standard drinks of alcohol per week.  She reports that she does not use drugs.  Allergies  Allergen Reactions   Cefaclor Hives and Rash   Escitalopram Oxalate     Rage and  insomnia   Prednisone  Other (See Comments)    Increases sx of depression   Ascorbic Acid     Family  History  Adopted: Yes  Problem Relation Age of Onset   Hypertension Mother     Prior to Admission medications   Medication Sig Start Date End Date Taking? Authorizing Provider  albuterol  (VENTOLIN  HFA) 108 (90 Base) MCG/ACT inhaler TAKE 2 PUFFS BY MOUTH EVERY 6 HOURS AS NEEDED FOR WHEEZE OR SHORTNESS OF BREATH 11/25/20   Antonette Angeline ORN, NP  benzonatate  (TESSALON ) 200 MG capsule Take 1 capsule (200 mg total) by mouth 3 (three) times daily as needed. 12/24/22   Antonette Angeline ORN, NP  busPIRone  (BUSPAR ) 15 MG tablet Take 1 tablet (15 mg total) by mouth 3 (three) times daily. 10/24/23   Antonette Angeline ORN, NP  cyclobenzaprine  (FLEXERIL ) 10 MG tablet Take 1 tablet (10 mg total) by mouth 2 (two) times daily as needed for muscle spasms. 04/11/23   Antonette Angeline ORN, NP  eszopiclone  (LUNESTA ) 2 MG TABS tablet Take 1 tablet (2 mg total) by mouth at bedtime as needed for sleep. Take immediately before bedtime 09/29/21   Antonette Angeline ORN, NP  FLUoxetine  (PROZAC ) 40 MG capsule Take 1 capsule (40 mg total) by mouth daily. 10/24/23   Antonette Angeline ORN, NP  fluticasone  (FLONASE ) 50 MCG/ACT nasal spray Place 2 sprays into both nostrils daily. 05/29/23   [provider]  fluticasone  (FLOVENT  HFA) 110 MCG/ACT inhaler Inhale 1 puff into the lungs in the morning and at bedtime. 03/21/22   Antonette Angeline ORN, NP  HYDROcodone  bit-homatropine (HYCODAN) 5-1.5 MG/5ML syrup Take 5 mLs by mouth every 8 (eight) hours as needed for cough. 12/23/23   Antonette Angeline ORN, NP  ipratropium-albuterol  (DUONEB) 0.5-2.5 (3) MG/3ML SOLN TAKE 3 MLS BY NEBULIZATION EVERY 8 (EIGHT) HOURS AS NEEDED. 01/18/22   Antonette Angeline ORN, NP  levalbuterol  (XOPENEX ) 1.25 MG/3ML nebulizer solution Take 1.25 mg by nebulization every 4 (four) hours as needed for wheezing. 12/23/23   Antonette Angeline ORN, NP  ondansetron  (ZOFRAN -ODT) 4 MG disintegrating tablet Take 1 tablet (4 mg total) by mouth every 8 (eight) hours as needed for nausea or vomiting. 01/29/23   Antonette Angeline ORN, NP     Physical Exam: Vitals:   12/25/23 1252 12/25/23 1255 12/25/23 1700 12/25/23 1920  BP:  (!) 140/56 (!) 168/93 (!) 109/97  Pulse:  93 100 (!) 103  Resp:  19 20 20   Temp:  98.1 F (36.7 C) 98.5 F (36.9 C)   TempSrc:   Oral   SpO2:  99% 98% 99%  Weight: 129.3 kg     Height: 5' 7 (1.702 m)      Physical Exam Vitals and nursing note reviewed.  Constitutional:      General: She is not in acute distress.    Appearance: She is not toxic-appearing or diaphoretic.  HENT:     Head: Normocephalic and atraumatic.  Eyes:     Extraocular Movements: Extraocular movements intact.     Pupils: Pupils are equal, round, and reactive to light.  Cardiovascular:     Rate and Rhythm: Normal rate and regular rhythm.  Pulmonary:     Effort: Pulmonary effort is normal. No tachypnea or respiratory distress.     Breath sounds: No stridor. Wheezing present. No decreased  breath sounds, rhonchi or rales.  Abdominal:     General: Bowel sounds are normal.     Palpations: Abdomen is soft.     Tenderness: There is no abdominal tenderness.  Musculoskeletal:     Cervical back: Neck supple.     Right lower leg: No tenderness. No edema.     Left lower leg: No tenderness. No edema.  Skin:    General: Skin is warm and dry.     Capillary Refill: Capillary refill takes less than 2 seconds.  Neurological:     General: No focal deficit present.     Mental Status: She is alert and oriented to person, place, and time.  Psychiatric:        Mood and Affect: Mood normal.        Behavior: Behavior normal.     Data Reviewed:   Labs on Admission: I have personally reviewed following labs and imaging studies  CBC: Recent Labs  Lab 12/25/23 1300  WBC 19.3*  NEUTROABS 15.5*  HGB 13.7  HCT 41.4  MCV 89.0  PLT 393   Basic Metabolic Panel: Recent Labs  Lab 12/25/23 1300  NA 136  K 4.2  CL 98  CO2 26  GLUCOSE 132*  BUN 14  CREATININE 0.76  CALCIUM 9.4   GFR: Estimated Creatinine Clearance:  140.1 mL/min (by C-G formula based on SCr of 0.76 mg/dL). Liver Function Tests: Recent Labs  Lab 12/25/23 1300  AST 22  ALT 33  ALKPHOS 109  BILITOT 0.3  PROT 7.6  ALBUMIN 4.4   No results for input(s): LIPASE, AMYLASE in the last 168 hours. No results for input(s): AMMONIA in the last 168 hours. Coagulation Profile: No results for input(s): INR, PROTIME in the last 168 hours. Cardiac Enzymes: No results for input(s): CKTOTAL, CKMB, CKMBINDEX, TROPONINI in the last 168 hours. BNP (last 3 results) No results for input(s): PROBNP in the last 8760 hours. HbA1C: No results for input(s): HGBA1C in the last 72 hours. CBG: No results for input(s): GLUCAP in the last 168 hours. Lipid Profile: No results for input(s): CHOL, HDL, LDLCALC, TRIG, CHOLHDL, LDLDIRECT in the last 72 hours. Thyroid  Function Tests: No results for input(s): TSH, T4TOTAL, FREET4, T3FREE, THYROIDAB in the last 72 hours. Anemia Panel: No results for input(s): VITAMINB12, FOLATE, FERRITIN, TIBC, IRON, RETICCTPCT in the last 72 hours. Urine analysis:    Component Value Date/Time   COLORURINE YELLOW (A) 06/24/2021 1757   APPEARANCEUR CLEAR (A) 06/24/2021 1757   LABSPEC 1.012 06/24/2021 1757   PHURINE 6.0 06/24/2021 1757   GLUCOSEU NEGATIVE 06/24/2021 1757   HGBUR MODERATE (A) 06/24/2021 1757   BILIRUBINUR NEGATIVE 06/24/2021 1757   KETONESUR NEGATIVE 06/24/2021 1757   PROTEINUR NEGATIVE 06/24/2021 1757   NITRITE NEGATIVE 06/24/2021 1757   LEUKOCYTESUR NEGATIVE 06/24/2021 1757    Radiological Exams on Admission: CT Angio Chest PE W and/or Wo Contrast Result Date: 12/25/2023 EXAM: CTA of the Chest with contrast for PE 12/25/2023 06:43:57 PM TECHNIQUE: CTA of the chest was performed after the administration of 100 mL of iohexol  (OMNIPAQUE ) 350 MG/ML injection. Multiplanar reformatted images are provided for review. MIP images are provided for review.  Automated exposure control, iterative reconstruction, and/or weight based adjustment of the mA/kV was utilized to reduce the radiation dose to as low as reasonably achievable. COMPARISON: CT Angio Chest 12/17/2021 CLINICAL HISTORY: Pulmonary embolism (PE) suspected, low to intermediate prob, positive D-dimer. FINDINGS: PULMONARY ARTERIES: Limited evaluation of the pulmonary arteries due to timing of  contrast in the body habitus. No central or proximal segmental pulmonary embolism. Main pulmonary artery is normal in caliber. MEDIASTINUM: The heart and pericardium demonstrate no acute abnormality. There is no acute abnormality of the thoracic aorta. LYMPH NODES: No mediastinal, hilar or axillary lymphadenopathy. LUNGS AND PLEURA: Patchy airspace opacities within the right lower lobe that are nodular-like in appearance measuring up to 1.8 x 1.5 cm (5:128). Right upper lobe peribronchovascular patchy ground-glass airspace opacities. No pleural effusion or pneumothorax. UPPER ABDOMEN: Limited images of the upper abdomen are unremarkable. SOFT TISSUES AND BONES: No acute bone or soft tissue abnormality. IMPRESSION: 1. No central or proximal segmental pulmonary embolus. Limited evaluation of the pulmonary arteries more distally due to suboptimal contrast timing and body habitus, which may limit detection of pulmonary emboli. 2. Nodular-appearing patchy airspace opacity in the right lower lobe measuring up to 1.8 x 1.5 cm, suggestive of an infectious or inflammatory process; recommend short-interval non-contrast chest CT in 46 weeks to document resolution, with further evaluation if persistent. 3. Right upper lobe peribronchovascular patchy ground-glass airspace opacities, likely infectious or inflammatory; recommend short-interval non-contrast chest CT in 46 weeks to confirm resolution. Electronically signed by: Morgane Naveau MD 12/25/2023 07:57 PM EST RP Workstation: [id number]   CT Soft Tissue Neck W  Contrast Result Date: 12/25/2023 EXAM: CT NECK WITH CONTRAST 12/25/2023 06:43:57 PM TECHNIQUE: CT of the neck was performed with the administration of 100 mL of iohexol  (OMNIPAQUE ) 350 MG/ML injection. Multiplanar reformatted images are provided for review. Automated exposure control, iterative reconstruction, and/or weight based adjustment of the mA/kV was utilized to reduce the radiation dose to as low as reasonably achievable. COMPARISON: None available. CLINICAL HISTORY: Asthma, shortness of breath, voice changes, stridor. FINDINGS: AERODIGESTIVE TRACT: No discrete mass. No edema. SALIVARY GLANDS: The parotid and submandibular glands are unremarkable. THYROID : Unremarkable. LYMPH NODES: No suspicious cervical lymphadenopathy. SOFT TISSUES: No mass or fluid collection. BRAIN, ORBITS, SINUSES AND MASTOIDS: No acute abnormality. LUNGS AND MEDIASTINUM: Pulmonary opacities are better characterized on concomitant CTA of the chest. BONES: No focal bone abnormality. IMPRESSION: 1. No acute findings or discrete mass in the neck. Electronically signed by: Franky Stanford MD 12/25/2023 07:56 PM EST RP Workstation: [id number]   DG Chest Portable 1 View Result Date: 12/25/2023 CLINICAL DATA:  Shortness of breath.  Asthma. EXAM: PORTABLE CHEST 1 VIEW COMPARISON:  12/22/2023 FINDINGS: Heart size is within normal limits for technique. Mediastinal contours are within normal limits. Both lungs are clear. No pleural effusion or pneumothorax. No acute osseous abnormality. IMPRESSION: No acute cardiopulmonary findings. Electronically Signed   By: Harrietta Sherry M.D.   On: 12/25/2023 13:54       Assessment and Plan:  33 y/o F with asthma with 1 month of cough, dyspnea, wheezing despite prednisone  and zpack outpatient. Hemodynamically stable saturating appropriately on room air, symptomatically improved with solumedrol, IV magnesium, and epinephrine given via EMS. CTA with nodular opacity in RLL, and peribronchovascular  ground glass opacities in RUL, infectious vs inflammatory. WBC 19, PCT negative  Admitted for further evaluation/management of asthma exacerbation and pneumonia   Asthma exacerbation, moderate   2/2 CAP  - breathing comfortably on RA on my assessment following IV magnesium, solumedrol, epinephrine, nebs. No prior hx intubation.  - budesonide  BID, albuteral neb q6, PRN duonebs  - solumedrol BID  - respiratory pathogen panel, MRSA nares, sputum, and blood cultures pending  - ceftriaxone  and doxycyline  - antitussives/supportive care - repeat CT scan 4-6 weeks recommended   SIRS criteria  -  tachycardia and leukocytosis, has been on prednisone  and was given epinephrine. Tachycardia resolved. Lactic elevated in setting of increased WOB PTA.   Anxiety/Depression  - resume home medications when reconciled    SCD's  Regular diet  LR@75  Monitor/replace electrolytes   Advance Care Planning:   Code Status: Prior discussed with patient at the time of admission   Severity of Illness: The appropriate patient status for this patient is INPATIENT. Inpatient status is judged to be reasonable and necessary in order to provide the required intensity of service to ensure the patient's safety. The patient's presenting symptoms, physical exam findings, and initial radiographic and laboratory data in the context of their chronic comorbidities is felt to place them at high risk for further clinical deterioration. Furthermore, it is not anticipated that the patient will be medically stable for discharge from the hospital within 2 midnights of admission.   * I certify that at the point of admission it is my clinical judgment that the patient will require inpatient hospital care spanning beyond 2 midnights from the point of admission due to high intensity of service, high risk for further deterioration and high frequency of surveillance required.*  Author: Daved JAYSON Pump, DO 12/25/2023 8:58 PM  For on call  review [email or web address].

## 2023-12-26 DIAGNOSIS — J441 Chronic obstructive pulmonary disease with (acute) exacerbation: Secondary | ICD-10-CM | POA: Diagnosis not present

## 2023-12-26 LAB — BASIC METABOLIC PANEL WITH GFR
Anion gap: 12 (ref 5–15)
BUN: 12 mg/dL (ref 6–20)
CO2: 23 mmol/L (ref 22–32)
Calcium: 8.9 mg/dL (ref 8.9–10.3)
Chloride: 101 mmol/L (ref 98–111)
Creatinine, Ser: 0.64 mg/dL (ref 0.44–1.00)
GFR, Estimated: >60 mL/min (ref >60)
Glucose, Bld: 104 mg/dL — ABNORMAL HIGH (ref 70–99)
Potassium: 4.4 mmol/L (ref 3.5–5.1)
Sodium: 136 mmol/L (ref 135–145)

## 2023-12-26 LAB — RESPIRATORY PANEL BY PCR

## 2023-12-26 LAB — CBC
HCT: 38.8 % (ref 36.0–46.0)
Hemoglobin: 12.5 g/dL (ref 12.0–15.0)
MCH: 29.3 pg (ref 26.0–34.0)
MCHC: 32.2 g/dL (ref 30.0–36.0)
MCV: 90.9 fL (ref 80.0–100.0)
Platelets: 374 K/uL (ref 150–400)
RBC: 4.27 MIL/uL (ref 3.87–5.11)
RDW: 13.9 % (ref 11.5–15.5)
WBC: 23.6 K/uL — ABNORMAL HIGH (ref 4.0–10.5)
nRBC: 0 % (ref 0.0–0.2)

## 2023-12-26 LAB — HIV ANTIBODY (ROUTINE TESTING W REFLEX): HIV Screen 4th Generation wRfx: NONREACTIVE

## 2023-12-26 MED ORDER — ALPRAZOLAM 0.25 MG PO TABS
0.2500 mg | ORAL_TABLET | Freq: Two times a day (BID) | ORAL | Status: AC | PRN
  Administered 2023-12-27: 0.25 mg via ORAL
  Filled 2023-12-26: qty 1

## 2023-12-26 MED ORDER — CYCLOBENZAPRINE HCL 10 MG PO TABS
10.0000 mg | ORAL_TABLET | Freq: Two times a day (BID) | ORAL | Status: DC | PRN
  Administered 2023-12-26 – 2023-12-27 (×2): 10 mg via ORAL
  Filled 2023-12-26 (×2): qty 1

## 2023-12-26 MED ORDER — ENOXAPARIN SODIUM 80 MG/0.8ML IJ SOSY
0.5000 mg/kg | PREFILLED_SYRINGE | INTRAMUSCULAR | Status: DC
  Administered 2023-12-26 – 2023-12-27 (×2): 65 mg via SUBCUTANEOUS
  Filled 2023-12-26 (×3): qty 0.65

## 2023-12-26 MED ORDER — FLUTICASONE PROPIONATE 50 MCG/ACT NA SUSP
2.0000 | Freq: Every day | NASAL | Status: DC
  Administered 2023-12-26 – 2023-12-28 (×3): 2 via NASAL
  Filled 2023-12-26: qty 16

## 2023-12-26 MED ORDER — ALBUTEROL SULFATE (2.5 MG/3ML) 0.083% IN NEBU
2.5000 mg | INHALATION_SOLUTION | Freq: Two times a day (BID) | RESPIRATORY_TRACT | Status: DC
  Administered 2023-12-26 – 2023-12-28 (×4): 2.5 mg via RESPIRATORY_TRACT
  Filled 2023-12-26 (×4): qty 3

## 2023-12-26 MED ORDER — FLUOXETINE HCL 20 MG PO CAPS
40.0000 mg | ORAL_CAPSULE | Freq: Every day | ORAL | Status: DC
  Administered 2023-12-26 – 2023-12-28 (×3): 40 mg via ORAL
  Filled 2023-12-26 (×3): qty 2

## 2023-12-26 MED ORDER — BUSPIRONE HCL 10 MG PO TABS
15.0000 mg | ORAL_TABLET | Freq: Three times a day (TID) | ORAL | Status: DC
  Administered 2023-12-26 – 2023-12-28 (×7): 15 mg via ORAL
  Filled 2023-12-26 (×7): qty 2

## 2023-12-26 MED ORDER — PREDNISONE 20 MG PO TABS
40.0000 mg | ORAL_TABLET | Freq: Every day | ORAL | Status: DC
  Administered 2023-12-26 – 2023-12-28 (×3): 40 mg via ORAL
  Filled 2023-12-26 (×3): qty 2

## 2023-12-26 MED ORDER — ALPRAZOLAM 0.25 MG PO TABS
0.2500 mg | ORAL_TABLET | Freq: Once | ORAL | Status: AC | PRN
  Administered 2023-12-26: 0.25 mg via ORAL
  Filled 2023-12-26: qty 1

## 2023-12-26 NOTE — Plan of Care (Signed)

## 2023-12-26 NOTE — TOC CM/SW Note (Signed)
 Transition of Care [facility]) - Inpatient Brief Assessment   Patient Details  Name: Tracy Medina MRN: [id number] Date of Birth: [birth year]  Transition of Care [facility]) CM/SW Contact:    Corean ONEIDA Haddock, RN Phone Number: 12/26/2023, 11:13 AM   Clinical Narrative:   Transition of Care (TOC) Screening Note   Patient Details  Name: Tracy Medina Date of Birth: 1990-10-27   Transition of Care [facility]) CM/SW Contact:    Corean ONEIDA Haddock, RN Phone Number: 12/26/2023, 11:13 AM    Transition of Care Department [facility]) has reviewed patient and no TOC needs have been identified at this time. . If new patient transition needs arise, please place a TOC consult.    Transition of Care Asessment: Insurance and Status: Insurance coverage has been reviewed Patient has primary care physician: Yes     Prior/Current Home Services: No current home services Social Drivers of Health Review: SDOH reviewed no interventions necessary Readmission risk has been reviewed: Yes Transition of care needs: no transition of care needs at this time

## 2023-12-26 NOTE — Progress Notes (Addendum)
 Progress Note    Tracy Medina  FMW:[id number] DOB: 1991-01-05  DOA: 12/25/2023 PCP: Antonette Angeline ORN, NP      Brief Narrative:    Medical records reviewed and are as summarized below:  Tracy Medina is a 33 y.o. female with medical history significant for asthma, anxiety, depression, morbid obesity, who presented to the hospital with cough, shortness of breath and wheezing.  She said she has been coughing for about a month now.  Coughing has not gotten any better.  She later developed shortness of breath and wheezing that has been going on for a few weeks.  Shortness of breath is with activity.  She was recently prescribed a course of prednisone  and azithromycin .  She said she completed azithromycin  about 2 days prior to admission and she is still on prednisone  taper.  However, her symptoms have not gotten any better.  Her voice has also been hoarse from excessive coughing.  Vitals in the ED: Temperature 98.1 F, respiratory 19, pulse fluctuating between 90 and 115, BP 140/56, oxygen saturation 99% on room air.  CTA chest IMPRESSION: 1. No central or proximal segmental pulmonary embolus. Limited evaluation of the pulmonary arteries more distally due to suboptimal contrast timing and body habitus, which may limit detection of pulmonary emboli. 2. Nodular-appearing patchy airspace opacity in the right lower lobe measuring up to 1.8 x 1.5 cm, suggestive of an infectious or inflammatory process; recommend short-interval non-contrast chest CT in 46 weeks to document resolution, with further evaluation if persistent. 3. Right upper lobe peribronchovascular patchy ground-glass airspace opacities, likely infectious or inflammatory; recommend short-interval non-contrast chest CT in 46 weeks to confirm resolution.   She was admitted to the hospital for acute asthma exacerbation and community-acquired pneumonia.   Assessment/Plan:   Principal Problem:   Asthma, chronic  obstructive, with acute exacerbation (HCC) Active Problems:   Asthma exacerbation    Body mass index is 44.64 kg/m.   Moderate persistent asthma with acute exacerbation: Continue prednisone  and bronchodilators.   Right lower lobe community-acquired pneumonia, right upper lobe peribronchovascular patchy ground glass airspace opacities on CT chest:  Respiratory viral panel positive for rhinovirus. Continue empiric IV ceftriaxone. Discontinue IV fluids Repeat CT chest in outpatient in 4 to 6 weeks to confirm resolution.   Leukocytosis, elevated lactic acid: Probable sepsis.  Leukocytosis could also be due to steroids.  Monitor WBC and lactic acid.   Anxiety, depression: Continue home medicines.  Xanax as needed for anxiety.    Diet Order             Diet regular Room service appropriate? Yes; Fluid consistency: Thin  Diet effective now                                  Consultants: None  Procedures: None    Medications:    albuterol   2.5 mg Nebulization BID   budesonide (PULMICORT) nebulizer solution  0.5 mg Nebulization BID   busPIRone   15 mg Oral TID   enoxaparin (LOVENOX) injection  0.5 mg/kg Subcutaneous Q24H   FLUoxetine   40 mg Oral Daily   fluticasone   2 spray Each Nare Daily   predniSONE   40 mg Oral Q breakfast   Continuous Infusions:  cefTRIAXone (ROCEPHIN)  IV       Anti-infectives (From admission, onward)    Start     Dose/Rate Route Frequency Ordered Stop   12/26/23 2030  cefTRIAXone (  ROCEPHIN) 1 g in sodium chloride  0.9 % 100 mL IVPB        1 g 200 mL/hr over 30 Minutes Intravenous Every 24 hours 12/25/23 2220 12/30/23 2029   12/25/23 2100  doxycycline  (VIBRAMYCIN ) 100 mg in sodium chloride  0.9 % 250 mL IVPB        100 mg 125 mL/hr over 120 Minutes Intravenous  Once 12/25/23 2009 12/25/23 2300   12/25/23 2015  cefTRIAXone (ROCEPHIN) 2 g in sodium chloride  0.9 % 100 mL IVPB        2 g 200 mL/hr over 30 Minutes Intravenous   Once 12/25/23 2009 12/25/23 2103              Family Communication/Anticipated D/C date and plan/Code Status   DVT prophylaxis: SCDs Start: 12/25/23 2214     Code Status: Full Code  Family Communication: None Disposition Plan: Plan to discharge home in 1 to 2 days   Status is: Inpatient Remains inpatient appropriate because: Acute asthma exacerbation, pneumonia       Subjective:   Interval events noted.  She complains of hoarse voice, cough and wheezing.  Breathing is a little better.  No chest pain.  Objective:    Vitals:   12/26/23 0115 12/26/23 0338 12/26/23 0721 12/26/23 0831  BP:  132/70  131/76  Pulse:    88  Resp:  16  18  Temp:  97.9 F (36.6 C)  98.5 F (36.9 C)  TempSrc:      SpO2: 94% 94% 97% 96%  Weight:      Height:       No data found.   Intake/Output Summary (Last 24 hours) at 12/26/2023 1313 Last data filed at 12/26/2023 0900 Gross per 24 hour  Intake 751.28 ml  Output --  Net 751.28 ml   Filed Weights   12/25/23 1252  Weight: 129.3 kg    Exam:   GEN: NAD SKIN: Warm and dry EYES: No pallor or icterus ENT: MMM, hoarse voice, no pharyngeal exudates or erythema CV: RRR, tachycardic PULM: Bilateral wheezing, decreased air entry bilaterally ABD: soft, ND, NT, +BS CNS: AAO x 3, non focal EXT: No edema or tenderness        Data Reviewed:   I have personally reviewed following labs and imaging studies:  Labs: Labs show the following:   Basic Metabolic Panel: Recent Labs  Lab 12/25/23 1300 12/26/23 0741  NA 136 136  K 4.2 4.4  CL 98 101  CO2 26 23  GLUCOSE 132* 104*  BUN 14 12  CREATININE 0.76 0.64  CALCIUM 9.4 8.9   GFR Estimated Creatinine Clearance: 140.1 mL/min (by C-G formula based on SCr of 0.64 mg/dL). Liver Function Tests: Recent Labs  Lab 12/25/23 1300  AST 22  ALT 33  ALKPHOS 109  BILITOT 0.3  PROT 7.6  ALBUMIN 4.4   No results for input(s): LIPASE, AMYLASE in the last 168  hours. No results for input(s): AMMONIA in the last 168 hours. Coagulation profile No results for input(s): INR, PROTIME in the last 168 hours.  CBC: Recent Labs  Lab 12/25/23 1300 12/26/23 0741  WBC 19.3* 23.6*  NEUTROABS 15.5*  --   HGB 13.7 12.5  HCT 41.4 38.8  MCV 89.0 90.9  PLT 393 374   Cardiac Enzymes: No results for input(s): CKTOTAL, CKMB, CKMBINDEX, TROPONINI in the last 168 hours. BNP (last 3 results) No results for input(s): PROBNP in the last 8760 hours. CBG: No results for input(s): GLUCAP  in the last 168 hours. D-Dimer: Recent Labs    12/25/23 1300  DDIMER 1.21*   Hgb A1c: No results for input(s): HGBA1C in the last 72 hours. Lipid Profile: No results for input(s): CHOL, HDL, LDLCALC, TRIG, CHOLHDL, LDLDIRECT in the last 72 hours. Thyroid  function studies: No results for input(s): TSH, T4TOTAL, T3FREE, THYROIDAB in the last 72 hours.  Invalid input(s): FREET3 Anemia work up: No results for input(s): VITAMINB12, FOLATE, FERRITIN, TIBC, IRON, RETICCTPCT in the last 72 hours. Sepsis Labs: Recent Labs  Lab 12/25/23 1300 12/25/23 2027 12/26/23 0741  PROCALCITON  --  <0.10  --   WBC 19.3*  --  23.6*  LATICACIDVEN  --  2.3*  --     Microbiology Recent Results (from the past 240 hours)  Resp panel by RT-PCR (RSV, Flu A&B, Covid) Anterior Nasal Swab     Status: None   Collection Time: 12/22/23  9:36 AM   Specimen: Anterior Nasal Swab  Result Value Ref Range Status   SARS Coronavirus 2 by RT PCR NEGATIVE NEGATIVE Final    Comment: (NOTE) SARS-CoV-2 target nucleic acids are NOT DETECTED.  The SARS-CoV-2 RNA is generally detectable in upper respiratory specimens during the acute phase of infection. The lowest concentration of SARS-CoV-2 viral copies this assay can detect is 138 copies/mL. A negative result does not preclude SARS-Cov-2 infection and should not be used as the sole basis for  treatment or other patient management decisions. A negative result may occur with  improper specimen collection/handling, submission of specimen other than nasopharyngeal swab, presence of viral mutation(s) within the areas targeted by this assay, and inadequate number of viral copies(<138 copies/mL). A negative result must be combined with clinical observations, patient history, and epidemiological information. The expected result is Negative.  Fact Sheet for Patients:  bloggercourse.com  Fact Sheet for Healthcare Providers:  seriousbroker.it  This test is no t yet approved or cleared by the [place]  FDA and  has been authorized for detection and/or diagnosis of SARS-CoV-2 by FDA under an Emergency Use Authorization (EUA). This EUA will remain  in effect (meaning this test can be used) for the duration of the COVID-19 declaration under Section 564(b)(1) of the Act, 21 U.S.C.section 360bbb-3(b)(1), unless the authorization is terminated  or revoked sooner.       Influenza A by PCR NEGATIVE NEGATIVE Final   Influenza B by PCR NEGATIVE NEGATIVE Final    Comment: (NOTE) The Xpert Xpress SARS-CoV-2/FLU/RSV plus assay is intended as an aid in the diagnosis of influenza from Nasopharyngeal swab specimens and should not be used as a sole basis for treatment. Nasal washings and aspirates are unacceptable for Xpert Xpress SARS-CoV-2/FLU/RSV testing.  Fact Sheet for Patients: bloggercourse.com  Fact Sheet for Healthcare Providers: seriousbroker.it  This test is not yet approved or cleared by the [place]  FDA and has been authorized for detection and/or diagnosis of SARS-CoV-2 by FDA under an Emergency Use Authorization (EUA). This EUA will remain in effect (meaning this test can be used) for the duration of the COVID-19 declaration under Section 564(b)(1) of the Act, 21  U.S.C. section 360bbb-3(b)(1), unless the authorization is terminated or revoked.     Resp Syncytial Virus by PCR NEGATIVE NEGATIVE Final    Comment: (NOTE) Fact Sheet for Patients: bloggercourse.com  Fact Sheet for Healthcare Providers: seriousbroker.it  This test is not yet approved or cleared by the [place]  FDA and has been authorized for detection and/or diagnosis of SARS-CoV-2 by FDA under an Emergency  Use Authorization (EUA). This EUA will remain in effect (meaning this test can be used) for the duration of the COVID-19 declaration under Section 564(b)(1) of the Act, 21 U.S.C. section 360bbb-3(b)(1), unless the authorization is terminated or revoked.  Performed at [facility], [address] [place], [place] [address]   Respiratory (~20 pathogens) panel by PCR     Status: Abnormal   Collection Time: 12/25/23 10:56 PM   Specimen: Nasopharyngeal Swab; Respiratory  Result Value Ref Range Status   Adenovirus NOT DETECTED NOT DETECTED Final   Coronavirus 229E NOT DETECTED NOT DETECTED Final    Comment: (NOTE) The Coronavirus on the Respiratory Panel, DOES NOT test for the novel  Coronavirus (2019 nCoV)    Coronavirus HKU1 NOT DETECTED NOT DETECTED Final   Coronavirus NL63 NOT DETECTED NOT DETECTED Final   Coronavirus OC43 NOT DETECTED NOT DETECTED Final   Metapneumovirus NOT DETECTED NOT DETECTED Final   Rhinovirus / Enterovirus DETECTED (A) NOT DETECTED Final   Influenza A NOT DETECTED NOT DETECTED Final   Influenza B NOT DETECTED NOT DETECTED Final   Parainfluenza Virus 1 NOT DETECTED NOT DETECTED Final   Parainfluenza Virus 2 NOT DETECTED NOT DETECTED Final   Parainfluenza Virus 3 NOT DETECTED NOT DETECTED Final   Parainfluenza Virus 4 NOT DETECTED NOT DETECTED Final   Respiratory Syncytial Virus NOT DETECTED NOT DETECTED Final   Bordetella pertussis NOT DETECTED NOT DETECTED Final   Bordetella  Parapertussis NOT DETECTED NOT DETECTED Final   Chlamydophila pneumoniae NOT DETECTED NOT DETECTED Final   Mycoplasma pneumoniae NOT DETECTED NOT DETECTED Final    Comment: Performed at [facility], [address], [place], [place] [address]    Procedures and diagnostic studies:  CT Angio Chest PE W and/or Wo Contrast Result Date: 12/25/2023 EXAM: CTA of the Chest with contrast for PE 12/25/2023 06:43:57 PM TECHNIQUE: CTA of the chest was performed after the administration of 100 mL of iohexol  (OMNIPAQUE ) 350 MG/ML injection. Multiplanar reformatted images are provided for review. MIP images are provided for review. Automated exposure control, iterative reconstruction, and/or weight based adjustment of the mA/kV was utilized to reduce the radiation dose to as low as reasonably achievable. COMPARISON: CT Angio Chest 12/17/2021 CLINICAL HISTORY: Pulmonary embolism (PE) suspected, low to intermediate prob, positive D-dimer. FINDINGS: PULMONARY ARTERIES: Limited evaluation of the pulmonary arteries due to timing of contrast in the body habitus. No central or proximal segmental pulmonary embolism. Main pulmonary artery is normal in caliber. MEDIASTINUM: The heart and pericardium demonstrate no acute abnormality. There is no acute abnormality of the thoracic aorta. LYMPH NODES: No mediastinal, hilar or axillary lymphadenopathy. LUNGS AND PLEURA: Patchy airspace opacities within the right lower lobe that are nodular-like in appearance measuring up to 1.8 x 1.5 cm (5:128). Right upper lobe peribronchovascular patchy ground-glass airspace opacities. No pleural effusion or pneumothorax. UPPER ABDOMEN: Limited images of the upper abdomen are unremarkable. SOFT TISSUES AND BONES: No acute bone or soft tissue abnormality. IMPRESSION: 1. No central or proximal segmental pulmonary embolus. Limited evaluation of the pulmonary arteries more distally due to suboptimal contrast timing and body habitus, which may limit  detection of pulmonary emboli. 2. Nodular-appearing patchy airspace opacity in the right lower lobe measuring up to 1.8 x 1.5 cm, suggestive of an infectious or inflammatory process; recommend short-interval non-contrast chest CT in 46 weeks to document resolution, with further evaluation if persistent. 3. Right upper lobe peribronchovascular patchy ground-glass airspace opacities, likely infectious or inflammatory; recommend short-interval  non-contrast chest CT in 46 weeks to confirm resolution. Electronically signed by: Morgane Naveau MD 12/25/2023 07:57 PM EST RP Workstation: [id number]   CT Soft Tissue Neck W Contrast Result Date: 12/25/2023 EXAM: CT NECK WITH CONTRAST 12/25/2023 06:43:57 PM TECHNIQUE: CT of the neck was performed with the administration of 100 mL of iohexol  (OMNIPAQUE ) 350 MG/ML injection. Multiplanar reformatted images are provided for review. Automated exposure control, iterative reconstruction, and/or weight based adjustment of the mA/kV was utilized to reduce the radiation dose to as low as reasonably achievable. COMPARISON: None available. CLINICAL HISTORY: Asthma, shortness of breath, voice changes, stridor. FINDINGS: AERODIGESTIVE TRACT: No discrete mass. No edema. SALIVARY GLANDS: The parotid and submandibular glands are unremarkable. THYROID : Unremarkable. LYMPH NODES: No suspicious cervical lymphadenopathy. SOFT TISSUES: No mass or fluid collection. BRAIN, ORBITS, SINUSES AND MASTOIDS: No acute abnormality. LUNGS AND MEDIASTINUM: Pulmonary opacities are better characterized on concomitant CTA of the chest. BONES: No focal bone abnormality. IMPRESSION: 1. No acute findings or discrete mass in the neck. Electronically signed by: Franky Stanford MD 12/25/2023 07:56 PM EST RP Workstation: [id number]   DG Chest Portable 1 View Result Date: 12/25/2023 CLINICAL DATA:  Shortness of breath.  Asthma. EXAM: PORTABLE CHEST 1 VIEW COMPARISON:  12/22/2023 FINDINGS: Heart size is within normal  limits for technique. Mediastinal contours are within normal limits. Both lungs are clear. No pleural effusion or pneumothorax. No acute osseous abnormality. IMPRESSION: No acute cardiopulmonary findings. Electronically Signed   By: Harrietta Sherry M.D.   On: 12/25/2023 13:54               LOS: 1 day   Tracy Medina  [facility]   Pager on [email or web address]. If 7PM-7AM, please contact night-coverage at [email or web address]     12/26/2023, 1:13 PM

## 2023-12-27 DIAGNOSIS — J441 Chronic obstructive pulmonary disease with (acute) exacerbation: Secondary | ICD-10-CM

## 2023-12-27 LAB — CBC
HCT: 37.1 % (ref 36.0–46.0)
Hemoglobin: 12.1 g/dL (ref 12.0–15.0)
MCH: 29.9 pg (ref 26.0–34.0)
MCHC: 32.6 g/dL (ref 30.0–36.0)
MCV: 91.6 fL (ref 80.0–100.0)
Platelets: 359 K/uL (ref 150–400)
RBC: 4.05 MIL/uL (ref 3.87–5.11)
RDW: 14 % (ref 11.5–15.5)
WBC: 20 K/uL — ABNORMAL HIGH (ref 4.0–10.5)
nRBC: 0 % (ref 0.0–0.2)

## 2023-12-27 LAB — LACTIC ACID, PLASMA: Lactic Acid, Venous: 2.5 mmol/L (ref 0.5–1.9)

## 2023-12-27 LAB — HIGH SENSITIVITY CRP: CRP, High Sensitivity: 4.09 mg/L — ABNORMAL HIGH (ref 0.00–3.00)

## 2023-12-27 MED ORDER — ALPRAZOLAM 0.25 MG PO TABS
0.2500 mg | ORAL_TABLET | Freq: Every day | ORAL | Status: DC | PRN
  Administered 2023-12-27: 0.25 mg via ORAL
  Filled 2023-12-27: qty 1

## 2023-12-27 MED ORDER — LIDOCAINE 5 % EX PTCH
1.0000 | MEDICATED_PATCH | CUTANEOUS | Status: DC
  Administered 2023-12-27: 1 via TRANSDERMAL
  Filled 2023-12-27: qty 1

## 2023-12-27 MED ORDER — CYCLOBENZAPRINE HCL 10 MG PO TABS
10.0000 mg | ORAL_TABLET | Freq: Three times a day (TID) | ORAL | Status: DC | PRN
  Administered 2023-12-27 – 2023-12-28 (×2): 10 mg via ORAL
  Filled 2023-12-27 (×2): qty 1
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
## 2023-12-27 NOTE — Plan of Care (Signed)
  Problem: Education: Goal: Knowledge of General Education information will improve Description: Including pain rating scale, medication(s)/side effects and non-pharmacologic comfort measures Outcome: Progressing   Problem: Health Behavior/Discharge Planning: Goal: Ability to manage health-related needs will improve Outcome: Progressing   Problem: Clinical Measurements: Goal: Ability to maintain clinical measurements within normal limits will improve Outcome: Progressing Goal: Will remain free from infection Outcome: Progressing Goal: Diagnostic test results will improve Outcome: Progressing Goal: Respiratory complications will improve Outcome: Progressing Goal: Cardiovascular complication will be avoided Outcome: Progressing   Problem: Activity: Goal: Risk for activity intolerance will decrease Outcome: Progressing   Problem: Nutrition: Goal: Adequate nutrition will be maintained Outcome: Progressing   Problem: Coping: Goal: Level of anxiety will decrease Outcome: Progressing   Problem: Pain Managment: Goal: General experience of comfort will improve and/or be controlled Outcome: Progressing   Problem: Safety: Goal: Ability to remain free from injury will improve Outcome: Progressing   Problem: Respiratory: Goal: Ability to maintain a clear airway will improve Outcome: Progressing Goal: Levels of oxygenation will improve Outcome: Progressing Goal: Ability to maintain adequate ventilation will improve Outcome: Progressing

## 2023-12-28 DIAGNOSIS — J441 Chronic obstructive pulmonary disease with (acute) exacerbation: Secondary | ICD-10-CM | POA: Diagnosis not present

## 2023-12-28 LAB — CBC
HCT: 39 % (ref 36.0–46.0)
Hemoglobin: 12.3 g/dL (ref 12.0–15.0)
MCH: 29.1 pg (ref 26.0–34.0)
MCHC: 31.5 g/dL (ref 30.0–36.0)
MCV: 92.4 fL (ref 80.0–100.0)
Platelets: 317 K/uL (ref 150–400)
RBC: 4.22 MIL/uL (ref 3.87–5.11)
RDW: 13.9 % (ref 11.5–15.5)
WBC: 15.2 K/uL — ABNORMAL HIGH (ref 4.0–10.5)
nRBC: 0 % (ref 0.0–0.2)

## 2023-12-28 LAB — LACTIC ACID, PLASMA: Lactic Acid, Venous: 1.3 mmol/L (ref 0.5–1.9)

## 2023-12-28 MED ORDER — AMOXICILLIN-POT CLAVULANATE 875-125 MG PO TABS: 1.0000 | ORAL_TABLET | Freq: Two times a day (BID) | ORAL | 0 refills | Status: AC

## 2023-12-28 MED ORDER — AMOXICILLIN-POT CLAVULANATE 875-125 MG PO TABS: 1.0000 | ORAL_TABLET | Freq: Two times a day (BID) | ORAL | Status: DC

## 2023-12-28 MED ORDER — PREDNISONE 20 MG PO TABS: 40.0000 mg | ORAL_TABLET | Freq: Every day | ORAL | 0 refills | Status: DC
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
## 2023-12-30 ENCOUNTER — Telehealth: Payer: Self-pay

## 2023-12-30 LAB — CULTURE, BLOOD (ROUTINE X 2)
Culture: NO GROWTH
Culture: NO GROWTH
Special Requests: ADEQUATE
Special Requests: ADEQUATE

## 2023-12-30 NOTE — Transitions of Care (Post Inpatient/ED Visit) (Signed)
   12/30/2023  Name: Tracy Medina MRN: [id number] DOB: [birth year]  Today's TOC FU Call Status: Today's TOC FU Call Status:: Unsuccessful Call (1st Attempt) Unsuccessful Call (1st Attempt) Date: 12/30/23  Attempted to reach the patient regarding the most recent Inpatient/ED visit.  Follow Up Plan: Additional outreach attempts will be made to reach the patient to complete the Transitions of Care (Post Inpatient/ED visit) call.   Signature Julian Lemmings, LPN [facility] Nurse Health Advisor Direct Dial [phone]

## 2023-12-30 NOTE — Transitions of Care (Post Inpatient/ED Visit) (Signed)
 12/30/2023  Name: Tracy Medina MRN: [id number] DOB: 09/28/1990  Today's TOC FU Call Status: Today's TOC FU Call Status:: Successful TOC FU Call Completed Unsuccessful Call (1st Attempt) Date: 12/30/23 [facility] FU Call Complete Date: 12/30/23  Patient's Name and Date of Birth confirmed. Name, DOB  Transition Care Management Follow-up Telephone Call Date of Discharge: 12/28/23 Discharge Facility: [facility]) Type of Discharge: Inpatient Admission Primary Inpatient Discharge Diagnosis:: COPD How have you been since you were released from the hospital?: Better Any questions or concerns?: No  Items Reviewed: Did you receive and understand the discharge instructions provided?: Yes Medications obtained,verified, and reconciled?: Yes (Medications Reviewed) Any new allergies since your discharge?: No Dietary orders reviewed?: Yes Do you have support at home?: Yes People in Home [RPT]: parent(s)  Medications Reviewed Today: Medications Reviewed Today     Reviewed by Emmitt Pan, LPN (Licensed Practical Nurse) on 12/30/23 at 1605  Med List Status: <None>   Medication Order Taking? Sig Documenting Provider Last Dose Status Informant  albuterol  (VENTOLIN  HFA) 108 (90 Base) MCG/ACT inhaler [id number] Yes TAKE 2 PUFFS BY MOUTH EVERY 6 HOURS AS NEEDED FOR WHEEZE OR SHORTNESS OF BREATH Baity, Angeline ORN, NP  Active Self  amoxicillin -clavulanate (AUGMENTIN ) 875-125 MG tablet [id number] Yes Take 1 tablet by mouth every 12 (twelve) hours for 2 days. Jens Durand, MD  Active   benzonatate  (TESSALON ) 100 MG capsule [id number] Yes Take 100 mg by mouth 3 (three) times daily as needed. [provider]  Active Self  busPIRone  (BUSPAR ) 15 MG tablet [id number] Yes Take 1 tablet (15 mg total) by mouth 3 (three) times daily. Antonette Angeline ORN, NP  Active Self  cyclobenzaprine  (FLEXERIL ) 10 MG tablet [id number]  Take 1 tablet (10 mg total) by mouth 2 (two) times daily  as needed for muscle spasms.  Patient not taking: Reported on 12/30/2023   Antonette Angeline ORN, NP  Active Self  eszopiclone  (LUNESTA ) 2 MG TABS tablet [id number] Yes Take 1 tablet (2 mg total) by mouth at bedtime as needed for sleep. Take immediately before bedtime Antonette Angeline ORN, NP  Active Self           Med Note GROVER, BURNARD RAMAN   Thu Dec 26, 2023  9:30 AM) PRN  FLUoxetine  (PROZAC ) 40 MG capsule [id number] Yes Take 1 capsule (40 mg total) by mouth daily. Antonette Angeline ORN, NP  Active Self  fluticasone  (FLONASE ) 50 MCG/ACT nasal spray [id number] Yes Place 2 sprays into both nostrils daily. [provider]  Active Self  fluticasone  (FLOVENT  HFA) 110 MCG/ACT inhaler [id number] Yes Inhale 1 puff into the lungs in the morning and at bedtime. Antonette Angeline ORN, NP  Active Self  HYDROcodone  bit-homatropine (HYCODAN) 5-1.5 MG/5ML syrup [id number] Yes Take 5 mLs by mouth every 8 (eight) hours as needed for cough. Antonette Angeline ORN, NP  Active Self  levalbuterol  (XOPENEX ) 1.25 MG/3ML nebulizer solution [id number] Yes Take 1.25 mg by nebulization every 4 (four) hours as needed for wheezing. Antonette Angeline ORN, NP  Active Self  lidocaine  (LIDODERM ) 5 % [id number] Yes Place 1 patch onto the skin daily. [provider]  Active Self  predniSONE  (DELTASONE ) 20 MG tablet [id number] Yes Take 2 tablets (40 mg total) by mouth daily for 2 days. Jens Durand, MD  Active             Home Care and Equipment/Supplies: Were Home Health Services Ordered?: NA Any new equipment or medical supplies ordered?: NA  Functional Questionnaire:  Do you need assistance with bathing/showering or dressing?: No Do you need assistance with meal preparation?: No Do you need assistance with eating?: No Do you have difficulty maintaining continence: No Do you need assistance with getting out of bed/getting out of a chair/moving?: No Do you have difficulty managing or taking your medications?: No  Follow up appointments  reviewed: PCP Follow-up appointment confirmed?: Yes Date of PCP follow-up appointment?: 12/31/23 Follow-up Provider: [facility] Follow-up appointment confirmed?: NA Do you need transportation to your follow-up appointment?: No Do you understand care options if your condition(s) worsen?: Yes-patient verbalized understanding    SIGNATURE Julian Lemmings, LPN [facility] Nurse Health Advisor Direct Dial [phone]

## 2023-12-31 ENCOUNTER — Encounter: Payer: Self-pay | Admitting: Internal Medicine

## 2023-12-31 ENCOUNTER — Other Ambulatory Visit: Payer: Self-pay | Admitting: Internal Medicine

## 2023-12-31 ENCOUNTER — Telehealth: Payer: Self-pay

## 2023-12-31 ENCOUNTER — Ambulatory Visit: Admitting: Internal Medicine

## 2023-12-31 VITALS — BP 140/80 | HR 100 | Ht 67.0 in | Wt 300.8 lb

## 2023-12-31 DIAGNOSIS — F5104 Psychophysiologic insomnia: Secondary | ICD-10-CM | POA: Diagnosis not present

## 2023-12-31 DIAGNOSIS — Z6841 Body Mass Index (BMI) 40.0 and over, adult: Secondary | ICD-10-CM | POA: Diagnosis not present

## 2023-12-31 DIAGNOSIS — G8929 Other chronic pain: Secondary | ICD-10-CM | POA: Diagnosis not present

## 2023-12-31 DIAGNOSIS — F32A Depression, unspecified: Secondary | ICD-10-CM

## 2023-12-31 DIAGNOSIS — E611 Iron deficiency: Secondary | ICD-10-CM

## 2023-12-31 DIAGNOSIS — F419 Anxiety disorder, unspecified: Secondary | ICD-10-CM

## 2023-12-31 DIAGNOSIS — F4312 Post-traumatic stress disorder, chronic: Secondary | ICD-10-CM | POA: Diagnosis not present

## 2023-12-31 DIAGNOSIS — E66813 Obesity, class 3: Secondary | ICD-10-CM | POA: Diagnosis not present

## 2023-12-31 DIAGNOSIS — M545 Low back pain, unspecified: Secondary | ICD-10-CM | POA: Diagnosis not present

## 2023-12-31 DIAGNOSIS — J4531 Mild persistent asthma with (acute) exacerbation: Secondary | ICD-10-CM

## 2023-12-31 MED ORDER — TRAMADOL HCL 50 MG PO TABS: 50.0000 mg | ORAL_TABLET | Freq: Every day | ORAL | 0 refills | Status: AC | PRN

## 2023-12-31 MED ORDER — FLUOXETINE HCL 20 MG PO CAPS: 20.0000 mg | ORAL_CAPSULE | Freq: Every day | ORAL | 1 refills | Status: DC

## 2023-12-31 MED ORDER — FLUTICASONE PROPIONATE HFA 110 MCG/ACT IN AERO: 1.0000 | INHALATION_SPRAY | Freq: Two times a day (BID) | RESPIRATORY_TRACT | 5 refills | Status: DC

## 2023-12-31 NOTE — Assessment & Plan Note (Signed)
 Deteriorated Will increase fluoxetine  to 60 mg daily Continue buspirone  15 mg 3 times daily as needed Support offered

## 2023-12-31 NOTE — Assessment & Plan Note (Signed)
 Advised her to start taking Flovent  110 mcg twice daily as prescribed, rinse mouth after use Continue albuterol  108 mcg per actuation every 4-6 hours as needed Continue levoalbbuterol will 1.25 mg nebulizer treatments every 4 hours as needed Referral to pulmonology placed for further evaluation and treatment Would consider montelukast 10 mg daily

## 2023-12-31 NOTE — Assessment & Plan Note (Addendum)
 She has eszopiclone  to take if needed We will monitor She is not interested in a repeat sleep study at this time

## 2023-12-31 NOTE — Telephone Encounter (Signed)
 Patient and mother advised. Will be here before or by 2pm for her appointment. Thanks!

## 2023-12-31 NOTE — Patient Instructions (Signed)
 Asthma, Adult  Asthma is a condition that causes swelling and narrowing of the airways. These are the passages that lead from the nose and mouth down into the lungs. When asthma symptoms get worse it is called an asthma attack or flare. This can make it hard to breathe. Asthma flares can range from minor to life-threatening. There is no cure for asthma, but medicines and lifestyle changes can help to control it. What are the causes? It is not known exactly what causes asthma, but certain things can cause asthma symptoms to get worse (triggers). What can trigger an asthma attack? Cigarette smoke. Mold. Dust. Your pet's skin flakes (dander). Cockroaches. Pollen. Air pollution (like household cleaners, wood smoke, smog, or Therapist, occupational). What are the signs or symptoms? Trouble breathing (shortness of breath). Coughing. Making high-pitched whistling sounds when you breathe, most often when you breathe out (wheezing). Chest tightness. Tiredness with little activity. Poor exercise tolerance. How is this treated? Controller medicines that help prevent asthma symptoms. Fast-acting reliever or rescue medicines. These give short-term relief of asthma symptoms. Allergy medicines if your attacks are brought on by allergens. Medicines to help control the body's defense (immune) system. Staying away from the things that cause asthma attacks. Follow these instructions at home: Avoiding triggers in your home Do not allow anyone to smoke in your home. Limit use of fireplaces and wood stoves. Get rid of pests (such as roaches and mice) and their droppings. Keep your home clean. Clean your floors. Dust regularly. Use cleaning products that do not smell. Wash bed sheets and blankets every week in hot water. Dry them in a dryer. Have someone vacuum when you are not home. Change your heating and air conditioning filters often. Use blankets that are made of polyester or cotton. General  instructions Take over-the-counter and prescription medicines only as told by your doctor. Do not smoke or use any products that contain nicotine or tobacco. If you need help quitting, ask your doctor. Stay away from secondhand smoke. Avoid doing things outdoors when allergen counts are high and when air quality is low. Warm up before you exercise. Take time to cool down after exercise. Use a peak flow meter as told by your doctor. A peak flow meter is a tool that measures how well your lungs are working. Keep track of the peak flow meter's readings. Write them down. Follow your asthma action plan. This is a written plan for taking care of your asthma and treating your attacks. Make sure you get all the shots (vaccines) that your doctor recommends. Ask your doctor about a flu shot and a pneumonia shot. Keep all follow-up visits. Contact a doctor if: You have wheezing, shortness of breath, or a cough even while taking medicine to prevent attacks. The mucus you cough up (sputum) is thicker than usual. The mucus you cough up changes from clear or white to yellow, green, gray, or is bloody. You have problems from the medicine you are taking, such as: A rash. Itching. Swelling. Trouble breathing. You need reliever medicines more than 2-3 times a week. Your peak flow reading is still at 50-79% of your personal best after following the action plan for 1 hour. You have a fever. Get help right away if: You seem to be worse and are not responding to medicine during an asthma attack. You are short of breath even at rest. You get short of breath when doing very little activity. You have trouble eating, drinking, or talking. You have chest  pain or tightness. You have a fast heartbeat. Your lips or fingernails start to turn blue. You are light-headed or dizzy, or you faint. Your peak flow is less than 50% of your personal best. You feel too tired to breathe normally. These symptoms may be an  emergency. Get help right away. Call 911. Do not wait to see if the symptoms will go away. Do not drive yourself to the hospital. Summary Asthma is a long-term (chronic) condition in which the airways get tight and narrow. An asthma attack can make it hard to breathe. Asthma cannot be cured, but medicines and lifestyle changes can help control it. Make sure you understand how to avoid triggers and how and when to use your medicines. Avoid things that can cause allergy symptoms (allergens). These include animal skin flakes (dander) and pollen from trees or grass. Avoid things that pollute the air. These may include household cleaners, wood smoke, smog, or chemical odors. This information is not intended to replace advice given to you by your health care provider. Make sure you discuss any questions you have with your health care provider. Document Revised: 10/17/2020 Document Reviewed: 10/17/2020 Elsevier Patient Education  2024 [employer].

## 2023-12-31 NOTE — Telephone Encounter (Signed)
 Copied from CRM #[id number]. Topic: Appointments - Appointment Scheduling >> Dec 31, 2023 12:38 PM Travis F wrote: Patient's mother Arlean is calling in because patient has an appointment today at 1:20 PM. Patient's mother says patient rescheduled yesterday 12/08 and was supposed to change the appointment to 2:40 PM today. Checked the appt history and saw the appointment was made for 1:20. Checked Regina's schedule and saw there is no ability for today. Patient's mother Arlean, says Angeline wanted to see patient due to her just getting out of the hospital and wanted to know could she be worked in sometime today around 2 or after. Please follow up with Arlean.

## 2023-12-31 NOTE — Assessment & Plan Note (Signed)
 Complicated by morbid obesity Encouraged regular stretching and core strengthening Continue cyclobenzaprine  10 mg 3 times daily as needed, lidoderm  patches 5% every 12 hours Will add tramadol  50 mg daily as needed, avoid daily use

## 2023-12-31 NOTE — Telephone Encounter (Signed)
 She scheduled the appt herself. I can not see her after 2:40. I can see her anytime before 2 today
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
## 2023-12-31 NOTE — Assessment & Plan Note (Signed)
 Encourage diet and exercise for weight loss

## 2023-12-31 NOTE — Assessment & Plan Note (Signed)
Recent CBC reviewed 

## 2024-01-02 NOTE — Telephone Encounter (Signed)
 Requested medication (s) are due for refill today - no  Requested medication (s) are on the active medication list -yes  Future visit scheduled -yes  Last refill: 12/31/23  Notes to clinic:   Pharmacy comment: Alternative Requested:PRIOR AUTH REQUIRED.   All Pharmacy Suggested Alternatives:  Fluticasone  Furoate Ellipta 100 MCG/ACT AEPB beclomethasone (QVAR REDIHALER) 40 MCG/ACT inhaler    Requested Prescriptions  Pending Prescriptions Disp Refills   Fluticasone  Furoate Ellipta 100 MCG/ACT AEPB [Pharmacy Med Name: FLUTICASONE  ELLIPTA INH]  0     Pulmonology:  Corticosteroids Passed - 01/02/2024  1:25 PM      Passed - Valid encounter within last 12 months    Recent Outpatient Visits           2 days ago Mild persistent asthma with acute exacerbation   [place] [facility] [place], Angeline ORN, NP   1 week ago Viral URI with cough   [place] [facility] [place], Angeline ORN, NP   2 months ago Anxiety and depression   [place] [facility] [place], Angeline ORN, NP   5 months ago Encounter for general adult medical examination with abnormal findings   [place] [facility] [place], Angeline ORN, NP   9 months ago Abscess of axilla, right   [place] [facility] [place], Angeline ORN, NP                 Requested Prescriptions  Pending Prescriptions Disp Refills   Fluticasone  Furoate Ellipta 100 MCG/ACT AEPB [Pharmacy Med Name: FLUTICASONE  ELLIPTA INH]  0     Pulmonology:  Corticosteroids Passed - 01/02/2024  1:25 PM      Passed - Valid encounter within last 12 months    Recent Outpatient Visits           2 days ago Mild persistent asthma with acute exacerbation   [place] [facility] [place], Angeline ORN, NP   1 week ago Viral URI with cough   [facility] [place], Angeline ORN, NP   2 months ago Anxiety and depression    [facility] [place], Angeline ORN, NP   5 months ago Encounter for general adult medical examination with abnormal findings    [facility] [place], Angeline ORN, NP   9 months ago Abscess of axilla, right   [facility] [place], Angeline ORN, [place]

## 2024-01-07 DIAGNOSIS — F4312 Post-traumatic stress disorder, chronic: Secondary | ICD-10-CM | POA: Diagnosis not present

## 2024-01-08 ENCOUNTER — Encounter: Payer: Self-pay | Admitting: Internal Medicine

## 2024-01-13 ENCOUNTER — Other Ambulatory Visit: Payer: Self-pay

## 2024-01-13 MED ORDER — FLUOXETINE HCL 40 MG PO CAPS: 40.0000 mg | ORAL_CAPSULE | Freq: Every day | ORAL | 0 refills | Status: AC

## 2024-01-14 ENCOUNTER — Ambulatory Visit: Admitting: Internal Medicine

## 2024-01-20 ENCOUNTER — Encounter: Payer: Self-pay | Admitting: Pulmonary Disease

## 2024-01-20 ENCOUNTER — Ambulatory Visit: Admitting: Pulmonary Disease

## 2024-01-20 VITALS — BP 124/88 | HR 95 | Temp 97.9°F | Ht 67.0 in | Wt 291.8 lb

## 2024-01-20 DIAGNOSIS — R0602 Shortness of breath: Secondary | ICD-10-CM

## 2024-01-20 DIAGNOSIS — J1289 Other viral pneumonia: Secondary | ICD-10-CM

## 2024-01-20 DIAGNOSIS — Z6841 Body Mass Index (BMI) 40.0 and over, adult: Secondary | ICD-10-CM

## 2024-01-20 DIAGNOSIS — E66813 Obesity, class 3: Secondary | ICD-10-CM | POA: Diagnosis not present

## 2024-01-20 DIAGNOSIS — J4520 Mild intermittent asthma, uncomplicated: Secondary | ICD-10-CM

## 2024-01-20 DIAGNOSIS — R918 Other nonspecific abnormal finding of lung field: Secondary | ICD-10-CM

## 2024-01-20 DIAGNOSIS — G479 Sleep disorder, unspecified: Secondary | ICD-10-CM | POA: Diagnosis not present

## 2024-01-20 LAB — NITRIC OXIDE: Nitric Oxide: 5

## 2024-01-20 MED ORDER — LEVALBUTEROL TARTRATE 45 MCG/ACT IN AERO: 2.0000 | INHALATION_SPRAY | RESPIRATORY_TRACT | 2 refills | Status: AC | PRN

## 2024-01-20 MED ORDER — FLUTICASONE FUROATE-VILANTEROL 100-25 MCG/ACT IN AEPB: 1.0000 | INHALATION_SPRAY | Freq: Every day | RESPIRATORY_TRACT | 5 refills | Status: DC

## 2024-01-20 NOTE — Progress Notes (Signed)
 "  Subjective:    Patient ID: Tracy Medina, female    DOB: 10-01-1990, 33 y.o.   MRN: [id number]  Patient Care Team: Antonette Angeline ORN, NP as PCP - General (Internal Medicine)  Chief Complaint  Patient presents with   Asthma    Occasional dry cough. Was admitted to [facility] on 12/25/2023, due to pneumonia. Not using Flovent  daily.     BACKGROUND: 33 year old lifelong never smoker with a history of lifelong asthma presents for evaluation of the same.  Recently had rhinovirus pneumonia and was admitted at [facility] from 25 December 2018 25 through 28 December 2023.  She is kindly referred by Angeline Antonette, NP.   HPI Discussed the use of AI scribe software for clinical note transcription with the patient, who gave verbal consent to proceed.  History of Present Illness   Tracy Medina is a 33 year old female with asthma who presents for evaluation of asthma management. She was referred by her primary doctor for specialist evaluation following hospitalization for pneumonia.  Patient is accompanied by her mother, Arlean.  She has a history of asthma that exacerbates significantly during the winter months and when she becomes ill. Recently, she was hospitalized due to pneumonia, which was complicated by an asthma attack that led to her passing out. Her asthma becomes uncontrollable during these episodes, and the only relief she finds is through the use of her nebulizer with levo albuterol .  During her recent hospitalization, she was diagnosed with rhinovirus, which triggered her asthma exacerbation.  Her asthma symptoms are episodic, with the worst times being during winter and allergy season. She uses a nebulizer with levalbuterol , which she finds more effective than regular albuterol . She has been prescribed Flovent , although she has not yet received it due to insurance issues.  In terms of her sleep, she reports improvement but still experiences waking during the night and occasional  snoring. No waking up short of breath currently, although she did experience this while hospitalized. She feels lethargic during the day and does not feel rested upon waking.  She has experienced weight loss since her hospitalization, attributed to a decrease in appetite after stopping prednisone , which had previously increased her appetite.    Since her hospital discharge she has not had any fevers, chills or sweats.  No cough or sputum production.  No hemoptysis.  She was noted to have some nodular infiltrate on CT chest performed on 25 December 2023, images were reviewed with the patient and her mother.  She will need follow-up in this regard.  Previously resided in [place] .  No occupational exposure.  She works for customer service remotely.     Review of Systems A 10 point review of systems was performed and it is as noted above otherwise negative.   Past Medical History:  Diagnosis Date   Allergy    Anxiety    Arthritis    Asthma    Depression    Endometriosis     Past Surgical History:  Procedure Laterality Date   ABDOMINAL HYSTERECTOMY     CHOLECYSTECTOMY     LAPAROSCOPY     TONSILLECTOMY AND ADENOIDECTOMY     TOTAL LAPAROSCOPIC HYSTERECTOMY WITH SALPINGECTOMY Bilateral 08/21/2021   Procedure: TOTAL LAPAROSCOPIC HYSTERECTOMY WITH SALPINGECTOMY;  Surgeon: Janit Alm Agent, MD;  Location: [facility] ORS;  Service: Gynecology;  Laterality: Bilateral;   TYMPANOSTOMY TUBE PLACEMENT      Patient Active Problem List   Diagnosis Date Noted   Chronic midline low  back pain without sciatica 12/12/2022   Class 3 severe obesity due to excess calories with body mass index (BMI) of 45.0 to 49.9 in adult [facility]) 05/30/2021   Insomnia 05/24/2020   Iron deficiency 02/29/2020   Anxiety and depression 02/29/2020   Asthma 02/29/2020    Family History  Adopted: Yes  Problem Relation Age of Onset   Hypertension Mother     Social History   Tobacco Use   Smoking status: Never    Smokeless tobacco: Never  Substance Use Topics   Alcohol use: Yes    Alcohol/week: 2.0 standard drinks of alcohol    Types: 1 Cans of beer, 1 Shots of liquor per week    Comment: social    Allergies[1]  Active Medications[2]  Immunization History  Administered Date(s) Administered   Influenza, Seasonal, Injecte, Preservative Fre 12/12/2022   Influenza,inj,Quad PF,6+ Mos 12/22/2016, 09/29/2021   Influenza-Unspecified 12/21/2020   PFIZER Comirnaty(Gray Top)Covid-19 Tri-Sucrose Vaccine 03/04/2020   PFIZER(Purple Top)SARS-COV-2 Vaccination 04/24/2019, 05/20/2019   PNEUMOCOCCAL CONJUGATE-20 07/18/2023   Tdap 05/24/2020        Objective:     Vitals:   01/20/24 0823  BP: 124/88  Pulse: 95  Temp: 97.9 F (36.6 C)  Height: 5' 7 (1.702 m)  Weight: 291 lb 12.8 oz (132.4 kg)  SpO2: 95%  TempSrc: Temporal  BMI (Calculated): 45.69     SpO2: 95 %  GENERAL: Obese woman, no acute distress, no conversational dyspnea.  Fully ambulatory. HEAD: Normocephalic, atraumatic.  EYES: Pupils equal, round, reactive to light.  No scleral icterus.  MOUTH: Dentition intact, oral mucosa moist.  No thrush. NECK: Supple. No thyromegaly. Trachea midline. No JVD.  No adenopathy. PULMONARY: Good air entry bilaterally.  No adventitious sounds. CARDIOVASCULAR: S1 and S2. Regular rate and rhythm.  No rubs, murmurs or gallops heard. ABDOMEN: Obese otherwise benign. MUSCULOSKELETAL: No joint deformity, no clubbing, no edema.  NEUROLOGIC: No overt focal deficit, no gait disturbance, speech is fluent. SKIN: Intact,warm,dry. PSYCH: Mood and behavior normal.   Representative image from CT performed 25 December 2023 showing the nodular opacities on the right lower lobe (arrow):     Lab Results  Component Value Date   NITRICOXIDE 5 01/20/2024  This result suggests low (<25) Type 2 (T2) airway inflammation indicating a low likelihood of active T2-driven airway inflammation; reduced probability of  response to inhaled corticosteroids.      01/20/2024    8:55 AM  Results of the Epworth flowsheet  Sitting and reading 2  Watching TV 3  Sitting, inactive in a public place (e.g. a theatre or a meeting) 1  As a passenger in a car for an hour without a break 1  Lying down to rest in the afternoon when circumstances permit 2  Sitting and talking to someone 1  Sitting quietly after a lunch without alcohol 2  In a car, while stopped for a few minutes in traffic 1  Total score 13     Assessment & Plan:     ICD-10-CM   1. Mild intermittent asthma without complication  J45.20 Nitric oxide    Pulmonary function test    2. Multiple lung nodules on CT  R91.8 CT CHEST WO CONTRAST    3. Sleep disturbance  G47.9     4. Shortness of breath  R06.02 Pulmonary function test    5. Class 3 severe obesity due to excess calories with body mass index (BMI) of 45.0 to 49.9 in adult, unspecified whether serious comorbidity present (  [facility])  [phone]    Z68.42       Orders Placed This Encounter  Procedures   CT CHEST WO CONTRAST    Standing Status:   Future    Expected Date:   02/10/2024    Expiration Date:   01/19/2025    Preferred imaging location?:   [facility]   Nitric oxide   Pulmonary function test    Standing Status:   Future    Expiration Date:   01/19/2025    Where should this test be performed?:   Outpatient Pulmonary    What type of PFT is being ordered?:   Full PFT    Meds ordered this encounter  Medications   levalbuterol  (XOPENEX  HFA) 45 MCG/ACT inhaler    Sig: Inhale 2 puffs into the lungs every 4 (four) hours as needed for wheezing.    Dispense:  1 each    Refill:  2   fluticasone  furoate-vilanterol (BREO ELLIPTA) 100-25 MCG/ACT AEPB    Sig: Inhale 1 puff into the lungs daily.    Dispense:  60 each    Refill:  5   Discussion:    Persistent asthma Exacerbations primarily during winter and allergy season, with recent severe episode requiring hospitalization.  Current management includes nebulizer with levo albuterol , effective but lacks anti-inflammatory properties. Flovent  prescribed but not yet obtained due to insurance issues.  Breo Ellipta recommended for simplicity and available insurance coverage. - Continue levo albuterol  nebulizer and MDI as rescue. - Prescribed Breo Ellipta, one puff daily. - Schedule pulmonary function testing. - Low nitric oxide level suggests asthma is obesity phenotype (non-type 2 mediated)  Recent viral pneumonia Secondary to rhinovirus, with CT showing patchy infiltrates. Airway inflammation currently well-managed. Repeat CT needed to ensure resolution of infiltrates. - Will order repeat chest CT in 3-4 weeks to assess resolution of infiltrates. - Will perform breathing tests to evaluate lung function.  Sleep disturbance Reports improvement in sleep disturbance but still experiences nocturnal awakenings. No current issues with shortness of breath upon waking. Previous sleep apnea test conducted, but no recent evaluation. Reports feeling lethargic during the day and not feeling rested upon waking. - Administered sleepiness scale questionnaire to assess daytime sleepiness: Epworth 13.  - Patient high risk for sleep apnea, will order home sleep study after first of the year.    Advised if symptoms do not improve or worsen, to please contact office for sooner follow up or seek emergency care.    I spent 60 minutes of dedicated to the care of this patient on the date of this encounter to include pre-visit review of records, face-to-face time with the patient discussing conditions above, post visit ordering of testing, clinical documentation with the electronic health record, making appropriate referrals as documented, and communicating necessary findings to members of the patients care team.   C. Leita Sanders, MD Advanced Bronchoscopy P[facility]    *This note was dictated using voice  recognition software/Dragon.  Despite best efforts to proofread, errors can occur which can change the meaning. Any transcriptional errors that result from this process are unintentional and may not be fully corrected at the time of dictation.     [1]  Allergies Allergen Reactions   Cefaclor Hives and Rash   Escitalopram Oxalate     Rage and  insomnia   Ascorbic Acid   [2]  Current Meds  Medication Sig   benzonatate  (TESSALON ) 100 MG capsule Take 100 mg by mouth 3 (three) times daily as  needed.   busPIRone  (BUSPAR ) 15 MG tablet Take 1 tablet (15 mg total) by mouth 3 (three) times daily.   cyclobenzaprine  (FLEXERIL ) 10 MG tablet Take 1 tablet (10 mg total) by mouth 2 (two) times daily as needed for muscle spasms.   eszopiclone  (LUNESTA ) 2 MG TABS tablet Take 1 tablet (2 mg total) by mouth at bedtime as needed for sleep. Take immediately before bedtime   FLUoxetine  (PROZAC ) 20 MG capsule Take 1 capsule (20 mg total) by mouth daily.   FLUoxetine  (PROZAC ) 40 MG capsule Take 1 capsule (40 mg total) by mouth daily.   fluticasone  (FLONASE ) 50 MCG/ACT nasal spray Place 2 sprays into both nostrils daily.   fluticasone  furoate-vilanterol (BREO ELLIPTA) 100-25 MCG/ACT AEPB Inhale 1 puff into the lungs daily.   HYDROcodone  bit-homatropine (HYCODAN) 5-1.5 MG/5ML syrup Take 5 mLs by mouth every 8 (eight) hours as needed for cough.   levalbuterol  (XOPENEX  HFA) 45 MCG/ACT inhaler Inhale 2 puffs into the lungs every 4 (four) hours as needed for wheezing.   levalbuterol  (XOPENEX ) 1.25 MG/3ML nebulizer solution Take 1.25 mg by nebulization every 4 (four) hours as needed for wheezing.   lidocaine  (LIDODERM ) 5 % Place 1 patch onto the skin daily.   traMADol  (ULTRAM ) 50 MG tablet Take 1 tablet (50 mg total) by mouth daily as needed.   [DISCONTINUED] fluticasone  (FLOVENT  HFA) 110 MCG/ACT inhaler Inhale 1 puff into the lungs in the morning and at bedtime.   "

## 2024-01-20 NOTE — Patient Instructions (Addendum)
 VISIT SUMMARY:  Today, we discussed your asthma management, recent hospitalization for pneumonia, and sleep disturbances. We reviewed your current medications and made some adjustments to better control your asthma symptoms. We also planned follow-up tests to ensure your lung health and address your sleep issues.  YOUR PLAN:  -ASTHMA: Asthma is a condition where your airways narrow and swell, making it hard to breathe. Your asthma worsens during winter and allergy season. You will continue using your Leva albuterol  nebulizer as needed. We have prescribed Breo 100, one puff daily, to help with inflammation. Additionally, you now have an inhaler form of levalbuterol  (Xopenex ) for convenience.  -RECENT VIRAL PNEUMONIA: Pneumonia is an infection that inflames the air sacs in one or both lungs. Your recent pneumonia was caused by rhinovirus and led to an asthma attack. We will order a repeat chest CT in 3-4 weeks to ensure the infection has cleared. We will also perform breathing tests to evaluate your lung function.  -SLEEP DISTURBANCE: You have been experiencing sleep disturbances, including waking up at night and feeling lethargic during the day. We administered a sleepiness scale questionnaire to assess your daytime sleepiness and will use the results to guide further evaluation.  INSTRUCTIONS:  Please continue using your Leva albuterol  nebulizer as needed and start using the Breo, one puff daily. Expect a call to schedule a repeat chest CT in 3-4 weeks and breathing tests to evaluate your lung function.  We will schedule a home sleep study once your new insurance kicks in.

## 2024-01-21 ENCOUNTER — Encounter

## 2024-01-24 ENCOUNTER — Telehealth: Payer: Self-pay

## 2024-01-24 DIAGNOSIS — J4520 Mild intermittent asthma, uncomplicated: Secondary | ICD-10-CM

## 2024-01-24 NOTE — Telephone Encounter (Signed)
 [facility] pharmacy requesting alternative for [facility] Alternative requested: Not Covered  Suggested Alternatives: Wixela 2510-50 Fluticasone -Salmeterol 250-50 Budesonide -Formoterol 160-4.5 Fluticasone -Vilanterol 100-25  Please advise.

## 2024-01-25 MED ORDER — FLUTICASONE-SALMETEROL 250-50 MCG/ACT IN AEPB: 1.0000 | INHALATION_SPRAY | Freq: Two times a day (BID) | RESPIRATORY_TRACT | 11 refills | Status: AC

## 2024-01-25 NOTE — Telephone Encounter (Signed)
 Sent Rx for Wixela 250/50

## 2024-01-27 NOTE — Telephone Encounter (Signed)
 Noted. Nothing further needed.

## 2024-02-07 ENCOUNTER — Encounter: Payer: Self-pay | Admitting: Internal Medicine

## 2024-02-08 ENCOUNTER — Ambulatory Visit
Admission: RE | Admit: 2024-02-08 | Discharge: 2024-02-08 | Disposition: A | Discharge status: Home / Self Care | Payer: Self-pay | Source: Ambulatory Visit | Attending: Family Medicine | Admitting: Family Medicine

## 2024-02-08 VITALS — BP 137/90 | HR 76 | Temp 98.2°F | Resp 15 | Ht 67.0 in | Wt 291.9 lb

## 2024-02-08 DIAGNOSIS — L03011 Cellulitis of right finger: Secondary | ICD-10-CM | POA: Diagnosis not present

## 2024-02-08 MED ORDER — DOXYCYCLINE HYCLATE 100 MG PO TABS: 100.0000 mg | ORAL_TABLET | Freq: Two times a day (BID) | ORAL | 0 refills | Status: AC

## 2024-02-08 NOTE — ED Triage Notes (Addendum)
 Patient states that she noticed a green spot under her right thumb nailbed about 2 days ago after her acrylic nail came off.  Patient reports some tenderness at her nailbed.

## 2024-02-08 NOTE — ED Provider Notes (Signed)
 " [facility]    CSN: [id number] Arrival date & time: 02/08/24  1250      History   Chief Complaint Chief Complaint  Patient presents with   Finger Pain    Appointment    HPI  34 year old female presents for evaluation of the above.  Patient recently took her acrylic nail off (right thumb). She has an area of tenderness which is discolored.  She believes that this is infectious in nature.  She reached out to her primary care physician and was advised to come in for evaluation.  No fever.  No other complaints.  Past Medical History:  Diagnosis Date   Allergy    Anxiety    Arthritis    Asthma    Depression    Endometriosis     Patient Active Problem List   Diagnosis Date Noted   Chronic midline low back pain without sciatica 12/12/2022   Class 3 severe obesity due to excess calories with body mass index (BMI) of 45.0 to 49.9 in adult [facility]) 05/30/2021   Insomnia 05/24/2020   Iron deficiency 02/29/2020   Anxiety and depression 02/29/2020   Asthma 02/29/2020    Past Surgical History:  Procedure Laterality Date   ABDOMINAL HYSTERECTOMY     CHOLECYSTECTOMY     LAPAROSCOPY     TONSILLECTOMY AND ADENOIDECTOMY     TOTAL LAPAROSCOPIC HYSTERECTOMY WITH SALPINGECTOMY Bilateral 08/21/2021   Procedure: TOTAL LAPAROSCOPIC HYSTERECTOMY WITH SALPINGECTOMY;  Surgeon: Janit Alm Agent, MD;  Location: [facility] ORS;  Service: Gynecology;  Laterality: Bilateral;   TYMPANOSTOMY TUBE PLACEMENT      OB History     Gravida  0   Para  0   Term  0   Preterm  0   AB  0   Living  0      SAB  0   IAB  0   Ectopic  0   Multiple  0   Live Births  0            Home Medications    Prior to Admission medications  Medication Sig Start Date End Date Taking? Authorizing Provider  doxycycline  (VIBRA -TABS) 100 MG tablet Take 1 tablet (100 mg total) by mouth 2 (two) times daily. 02/08/24  Yes Ivelise Castillo G, DO  busPIRone  (BUSPAR ) 15 MG tablet Take 1 tablet (15  mg total) by mouth 3 (three) times daily. 10/24/23   Antonette Angeline ORN, NP  cyclobenzaprine  (FLEXERIL ) 10 MG tablet Take 1 tablet (10 mg total) by mouth 2 (two) times daily as needed for muscle spasms. 04/11/23   Antonette Angeline ORN, NP  eszopiclone  (LUNESTA ) 2 MG TABS tablet Take 1 tablet (2 mg total) by mouth at bedtime as needed for sleep. Take immediately before bedtime 09/29/21   Antonette Angeline ORN, NP  FLUoxetine  (PROZAC ) 20 MG capsule Take 1 capsule (20 mg total) by mouth daily. 12/31/23   Antonette Angeline ORN, NP  FLUoxetine  (PROZAC ) 40 MG capsule Take 1 capsule (40 mg total) by mouth daily. 01/13/24   Antonette Angeline ORN, NP  fluticasone  (FLONASE ) 50 MCG/ACT nasal spray Place 2 sprays into both nostrils daily. 05/29/23   [provider]  fluticasone -salmeterol (WIXELA INHUB) 250-50 MCG/ACT AEPB Inhale 1 puff into the lungs in the morning and at bedtime. 01/25/24   Tamea Dedra CROME, MD  levalbuterol  (XOPENEX  HFA) 45 MCG/ACT inhaler Inhale 2 puffs into the lungs every 4 (four) hours as needed for wheezing. 01/20/24 01/19/25  Tamea Dedra CROME, MD  levalbuterol  (XOPENEX ) 1.25 MG/3ML nebulizer solution Take 1.25 mg by nebulization every 4 (four) hours as needed for wheezing. 12/23/23   Antonette Angeline ORN, NP  lidocaine  (LIDODERM ) 5 % Place 1 patch onto the skin daily.    [provider]    Family History Family History  Adopted: Yes  Problem Relation Age of Onset   Hypertension Mother     Social History Social History[1]   Allergies   Cefaclor, Escitalopram oxalate, and Ascorbic acid   Review of Systems Review of Systems Per HPI  Physical Exam Triage Vital Signs ED Triage Vitals  Encounter Vitals Group     BP 02/08/24 1303 (!) 137/90     Girls Systolic BP Percentile --      Girls Diastolic BP Percentile --      Boys Systolic BP Percentile --      Boys Diastolic BP Percentile --      Pulse Rate 02/08/24 1303 76     Resp 02/08/24 1303 15     Temp 02/08/24 1303 98.2 F (36.8 C)      Temp Source 02/08/24 1303 Oral     SpO2 02/08/24 1303 98 %     Weight 02/08/24 1302 291 lb 14.2 oz (132.4 kg)     Height 02/08/24 1302 5' 7 (1.702 m)     Head Circumference --      Peak Flow --      Pain Score 02/08/24 1301 1     Pain Loc --      Pain Education --      Exclude from Growth Chart --    No data found.  Updated Vital Signs BP (!) 137/90 (BP Location: Left Arm)   Pulse 76   Temp 98.2 F (36.8 C) (Oral)   Resp 15   Ht 5' 7 (1.702 m)   Wt 132.4 kg   LMP 07/23/2021   SpO2 98%   BMI 45.72 kg/m   Visual Acuity Right Eye Distance:   Left Eye Distance:   Bilateral Distance:    Right Eye Near:   Left Eye Near:    Bilateral Near:     Physical Exam Vitals and nursing note reviewed.  Constitutional:      General: She is not in acute distress. HENT:     Head: Normocephalic and atraumatic.  Skin:    Comments: Right thumb -there is a discrete area of green discoloration underneath the nail.  It is tender to palpation.  Neurological:     Mental Status: She is alert.      UC Treatments / Results  Labs (all labs ordered are listed, but only abnormal results are displayed) Labs Reviewed - No data to display  EKG   Radiology No results found.  Procedures Procedures (including critical care time)  Medications Ordered in UC Medications - No data to display  Initial Impression / Assessment and Plan / UC Course  I have reviewed the triage vital signs and the nursing notes.  Pertinent labs & imaging results that were available during my care of the patient were reviewed by me and considered in my medical decision making (see chart for details).    34 year old female presents with infection of the nail.  Treating with doxycycline .  Offered drainage.  She declined.  Final Clinical Impressions(s) / UC Diagnoses   Final diagnoses:  Infection of fingernail of right hand     Discharge Instructions      Antibiotic as prescribed.  Follow up with  PCP.  Take care  Dr. Bluford     ED Prescriptions     Medication Sig Dispense Auth. Provider   doxycycline  (VIBRA -TABS) 100 MG tablet Take 1 tablet (100 mg total) by mouth 2 (two) times daily. 14 tablet Ana Liaw G, DO      PDMP not reviewed this encounter.    [1]  Social History Tobacco Use   Smoking status: Never   Smokeless tobacco: Never  Vaping Use   Vaping status: Never Used  Substance Use Topics   Alcohol use: Yes    Alcohol/week: 2.0 standard drinks of alcohol    Types: 1 Cans of beer, 1 Shots of liquor per week    Comment: social   Drug use: Never     Rio Taber G, DO 02/08/24 1453  "

## 2024-02-08 NOTE — Discharge Instructions (Signed)
Antibiotic as prescribed.  Follow up with PCP.  Take care  Dr. Siddhartha Hoback  

## 2024-02-14 ENCOUNTER — Ambulatory Visit: Payer: Self-pay

## 2024-02-17 ENCOUNTER — Ambulatory Visit: Payer: Self-pay

## 2024-02-17 ENCOUNTER — Ambulatory Visit: Admitting: Pulmonary Disease

## 2024-02-23 ENCOUNTER — Other Ambulatory Visit: Payer: Self-pay | Admitting: Internal Medicine

## 2024-02-24 IMAGING — CT CT L SPINE W/O CM
3 of 6 series · 13 of 34 positions shown, 15 images · non-contrast
Comparison: None Available.

CLINICAL DATA: Initial evaluation for acute flank pain.



[Series 2: stone full standard · axial · 0.82mm/px · z∈[-925,-520]mm · 6 of 100 slices shown, 8 images]
[im 10/100  soft-tissue]
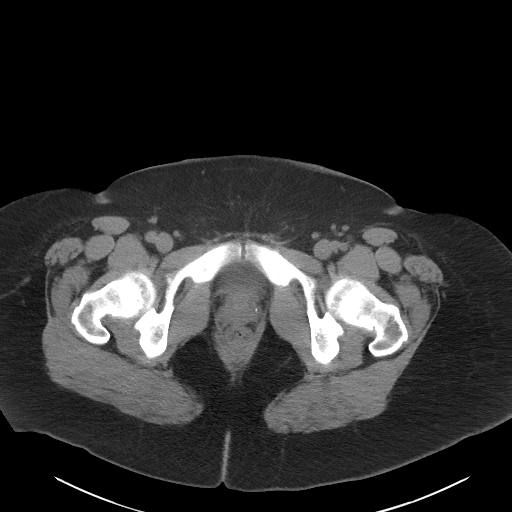
[im 10/100  bone]
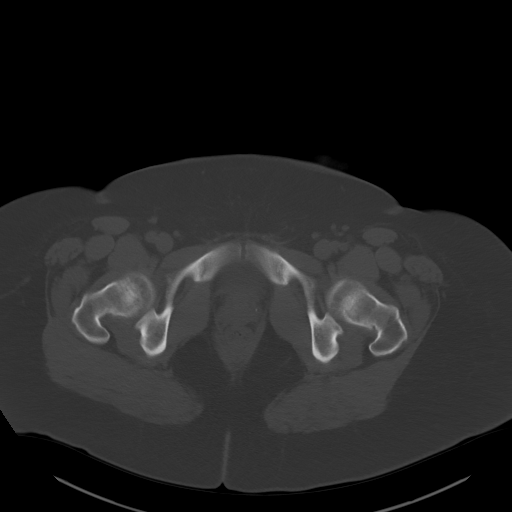
[im 28/100  bone]
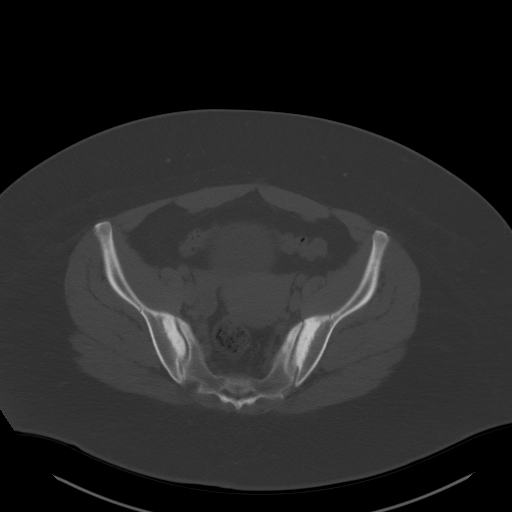
[im 46/100  bone]
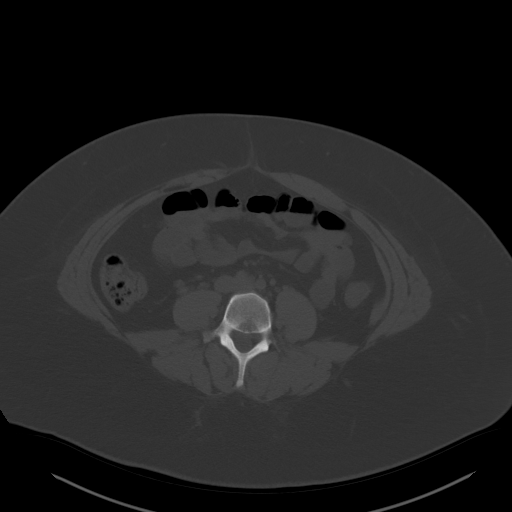
[im 55/100  bone]
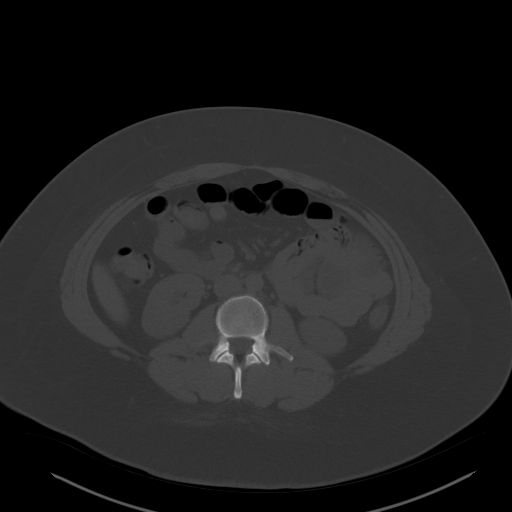
[im 73/100  soft-tissue]
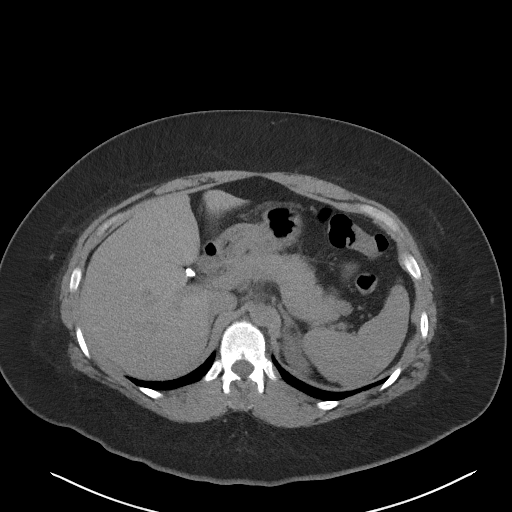
[im 73/100  bone]
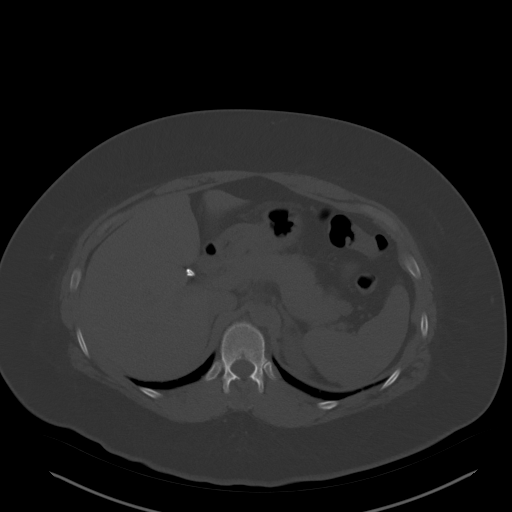
[im 91/100  bone]
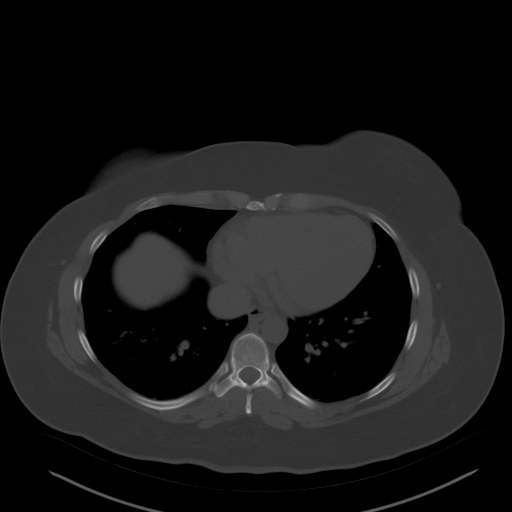

[Series 5: coronal · coronal · 0.68mm/px · 1 of 159 slices shown]
[im 80/159  bone]
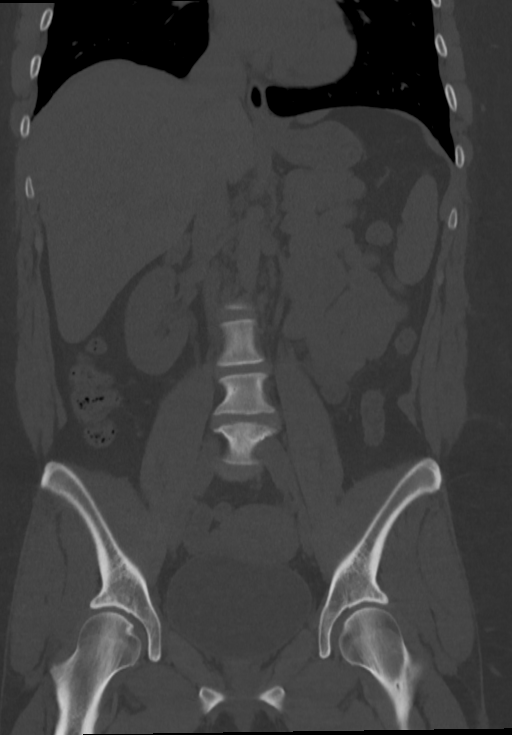

[Series 6: sagittal · sagittal · 0.62mm/px · 6 of 245 slices shown]
[im 62/245  bone]
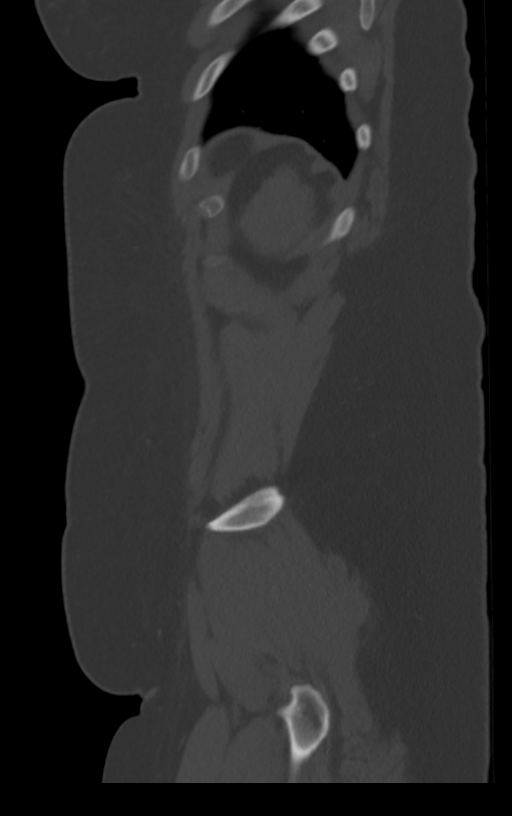
[im 92/245  bone]
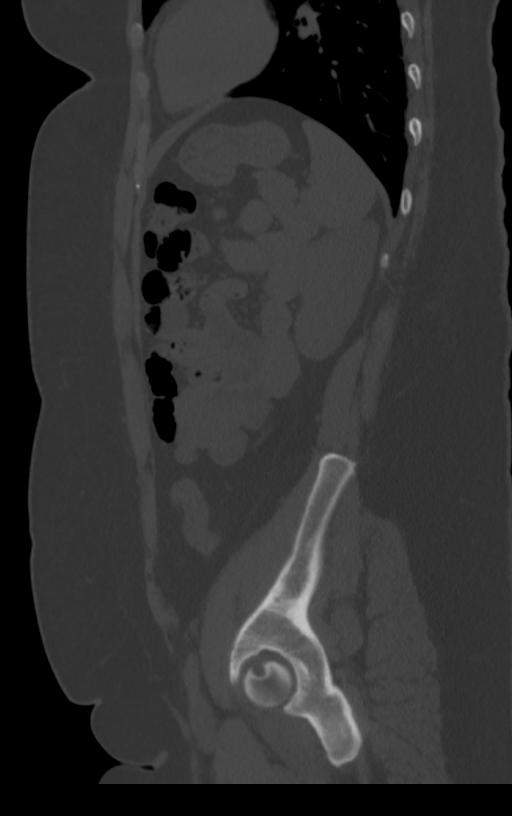
[im 123/245  bone]
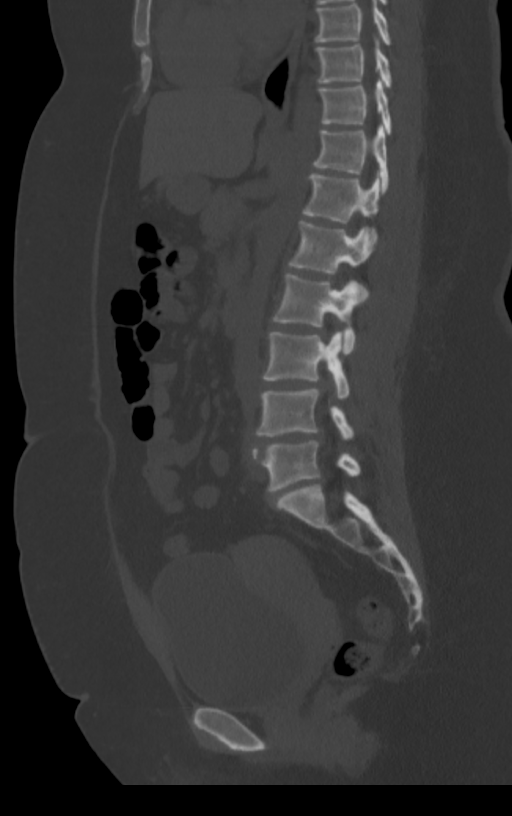
[im 153/245  bone]
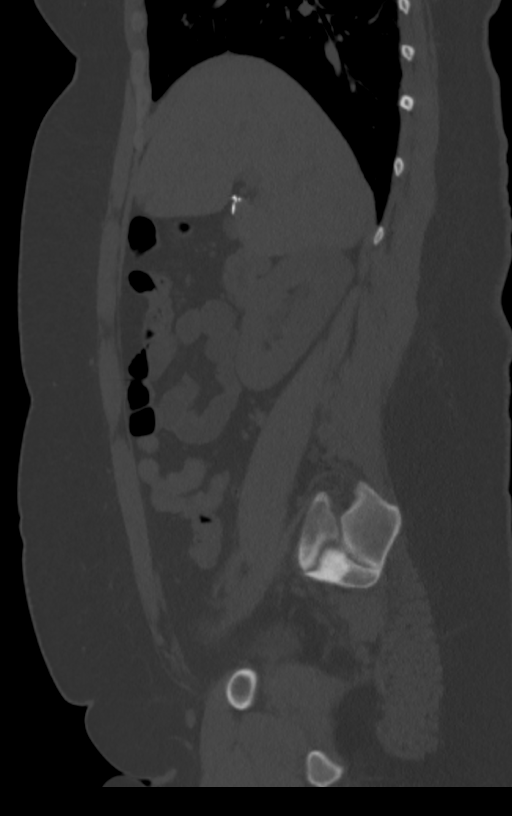
[im 184/245  bone]
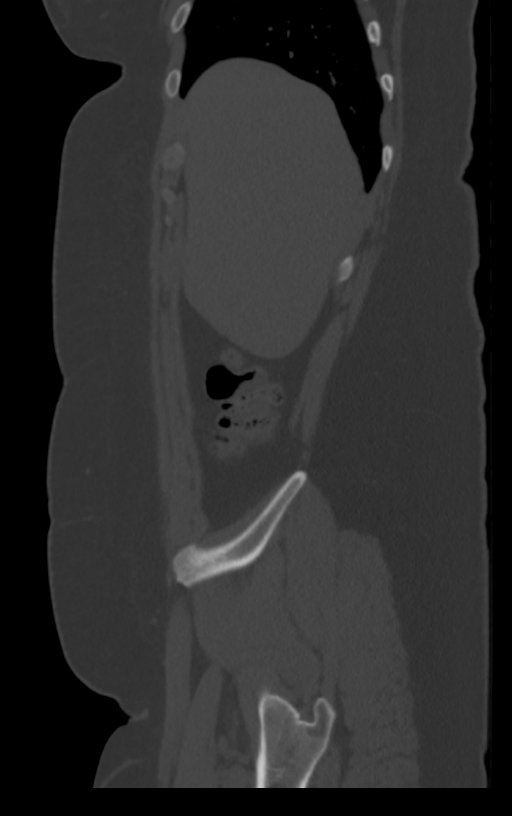
[im 192/245  soft-tissue]
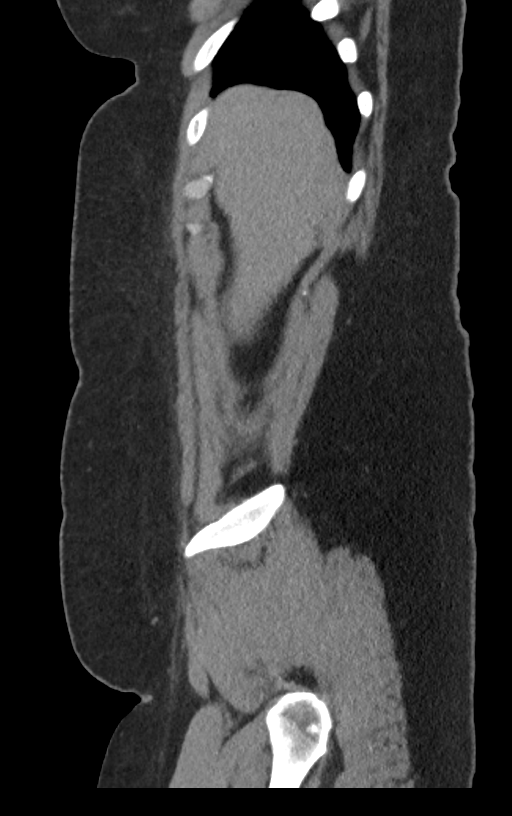

[13 of 34 positions shown; findings below may reference images not displayed]

FINDINGS: Segmentation: Standard. Lowest well-formed disc space labeled the
L5-S1 level.

Alignment: Mild sigmoid scoliosis.  No listhesis.

Vertebrae: Unilateral chronic right-sided pars defect present at L5
without spondylolisthesis. Vertebral body height maintained without
acute or chronic fracture. Chronic limbus deformity with endplate
spurring noted at the superior endplate of L5. Visualized sacrum and
pelvis intact. No discrete or worrisome osseous lesions.

Paraspinal and other soft tissues: Paraspinous soft tissues
demonstrate no acute finding.

Disc levels: Mild lower lumbar degenerative disc bulging at L3-4
through L5-S1 without significant spinal stenosis.
IMPRESSION: 1. No acute abnormality within the lumbar spine.
2. Unilateral chronic right-sided pars defect at L5 without
spondylolisthesis.

## 2024-02-24 IMAGING — CT CT RENAL STONE PROTOCOL
2 of 5 series · 16 of 46 positions shown, 18 images · non-contrast
Comparison: None Available.

CLINICAL DATA: Bilateral flank pain.



[Series 506: stone full standard thins · axial · 0.82mm/px · z∈[-953,-495]mm · 13 of 498 slices shown, 15 images]
[im 20/498  soft-tissue]
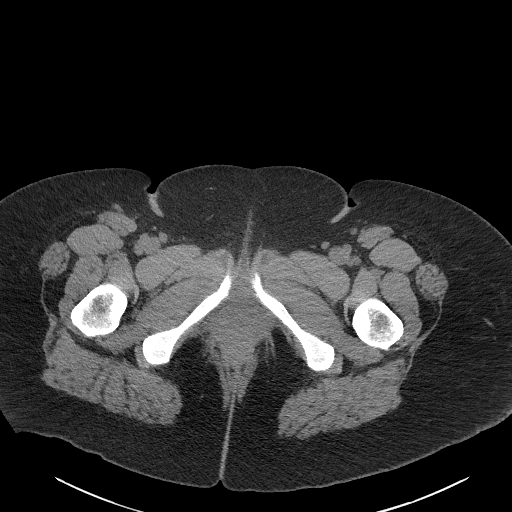
[im 20/498  bone]
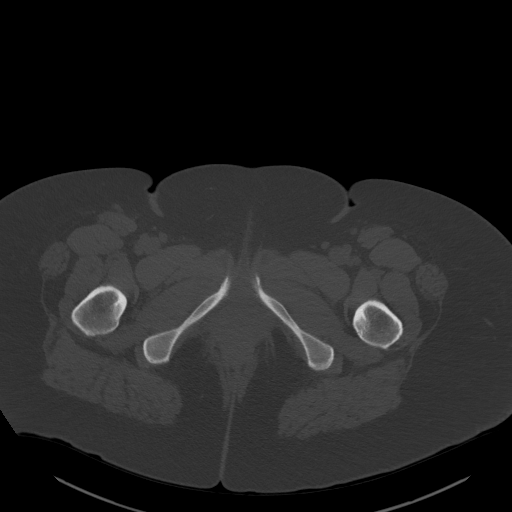
[im 60/498  soft-tissue]
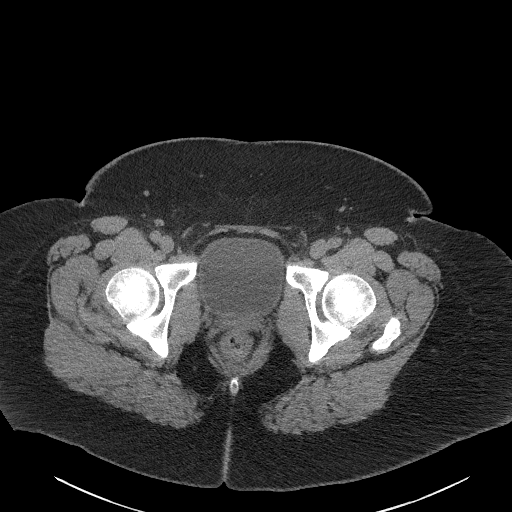
[im 100/498  soft-tissue]
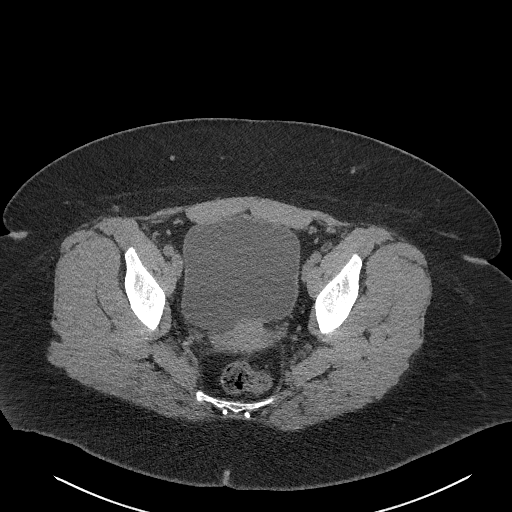
[im 140/498  soft-tissue]
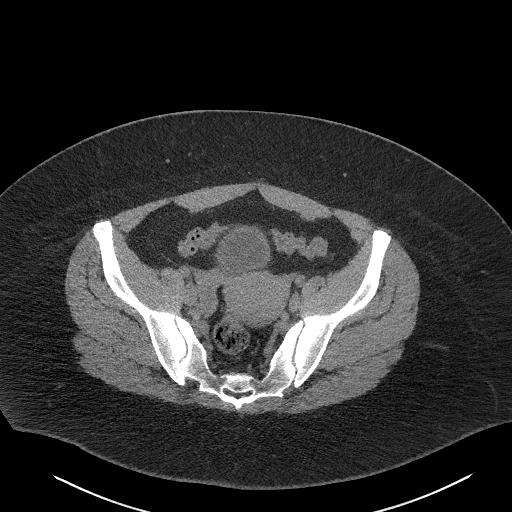
[im 179/498  soft-tissue]
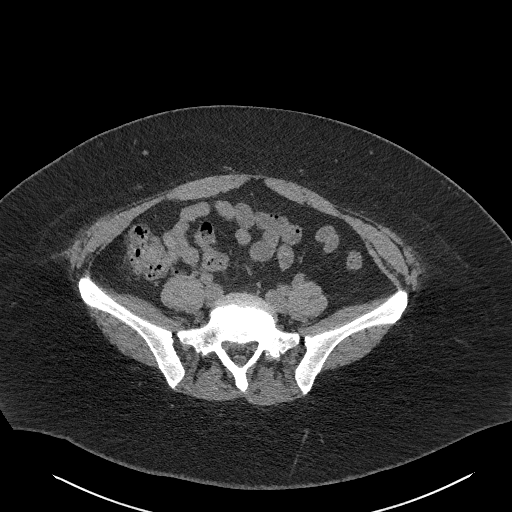
[im 219/498  soft-tissue]
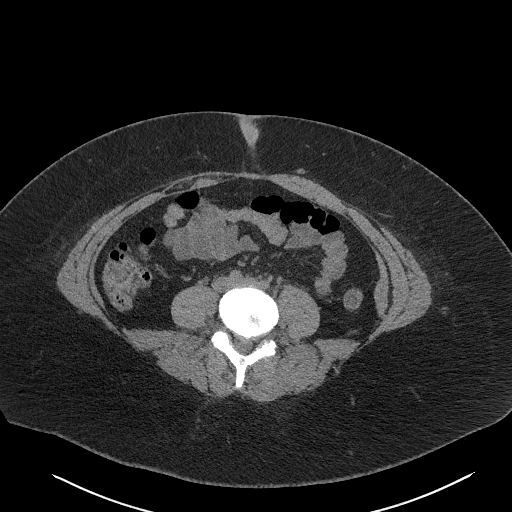
[im 259/498  soft-tissue]
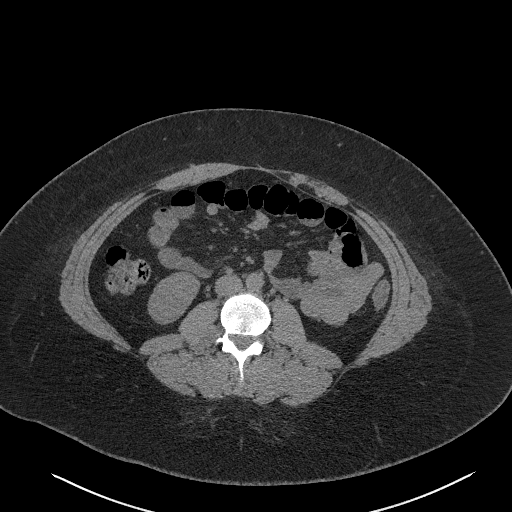
[im 279/498  soft-tissue]
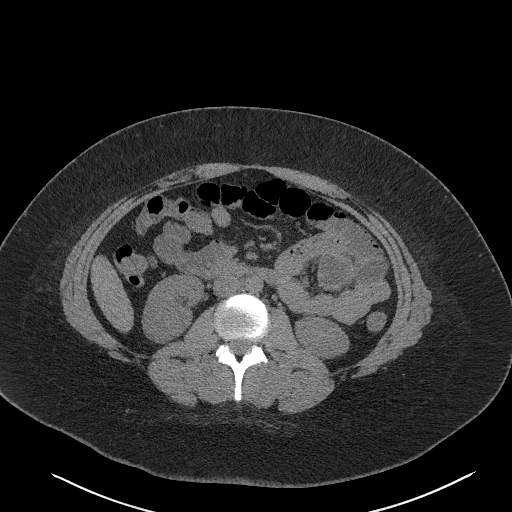
[im 319/498  soft-tissue]
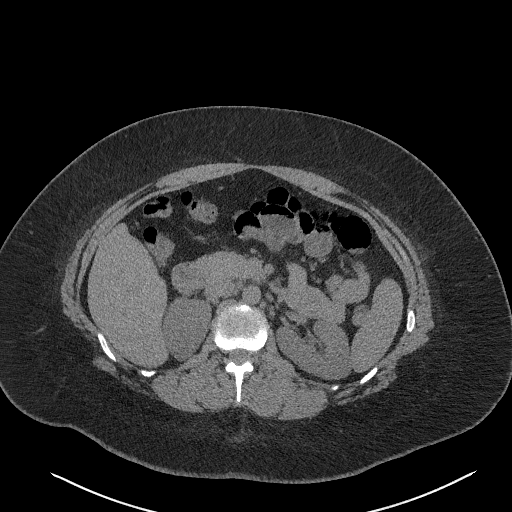
[im 319/498  bone]
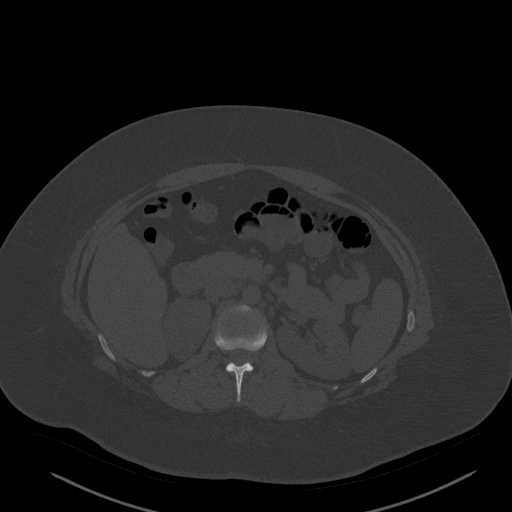
[im 358/498  soft-tissue]
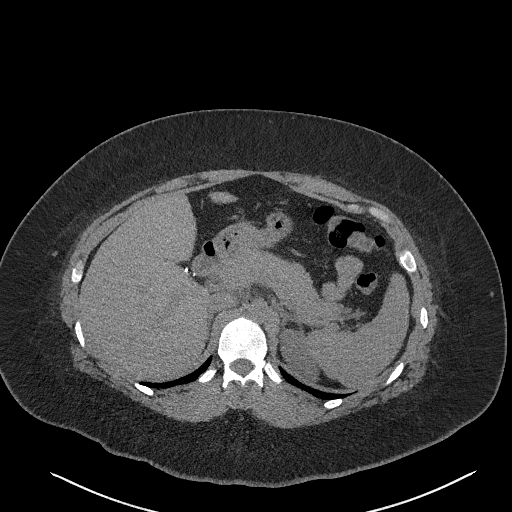
[im 398/498  soft-tissue]
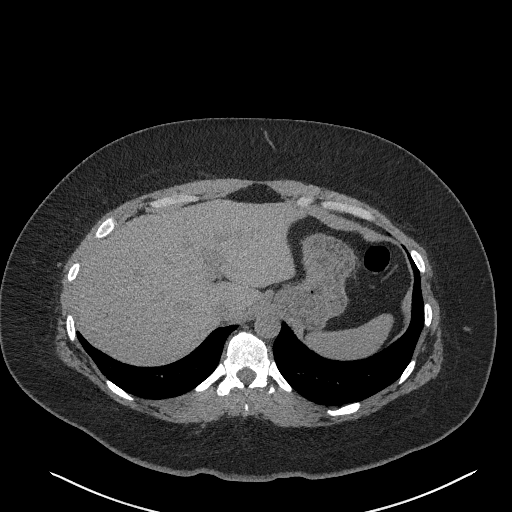
[im 438/498  soft-tissue]
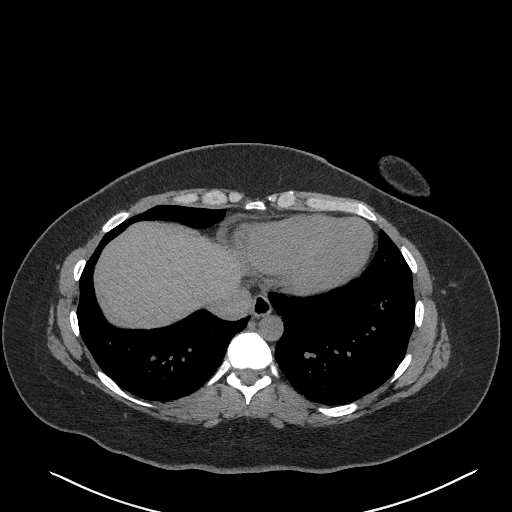
[im 478/498  soft-tissue]
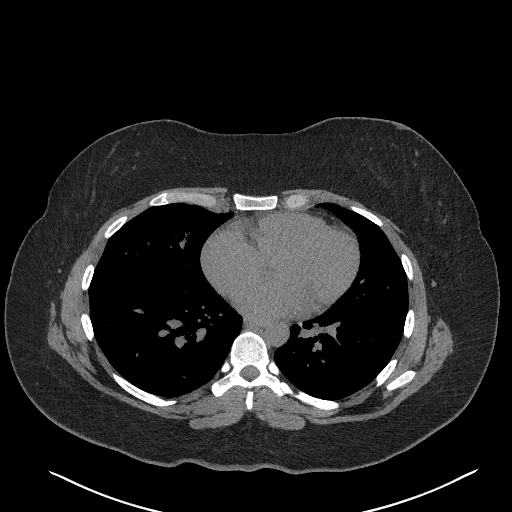

[Series 508: coronal · coronal · 0.68mm/px · 3 of 159 slices shown]
[im 53/159  soft-tissue]
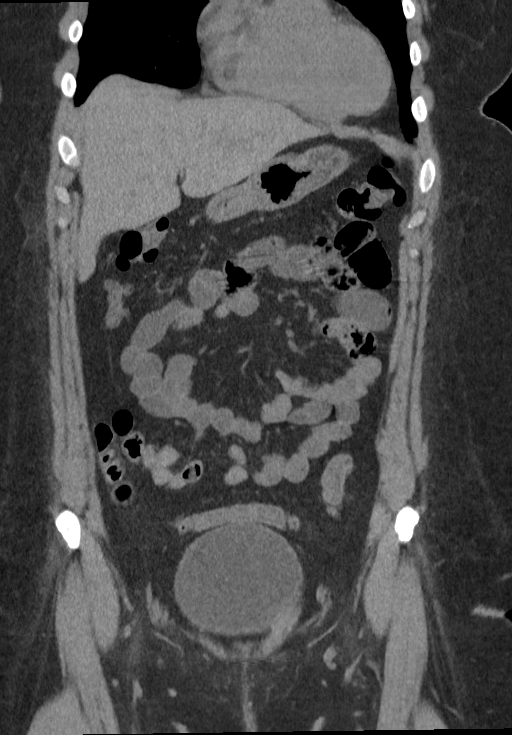
[im 71/159  soft-tissue]
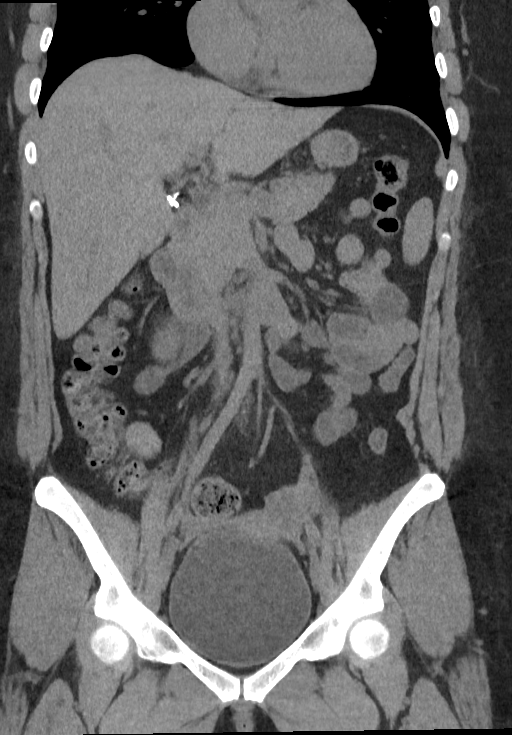
[im 88/159  soft-tissue]
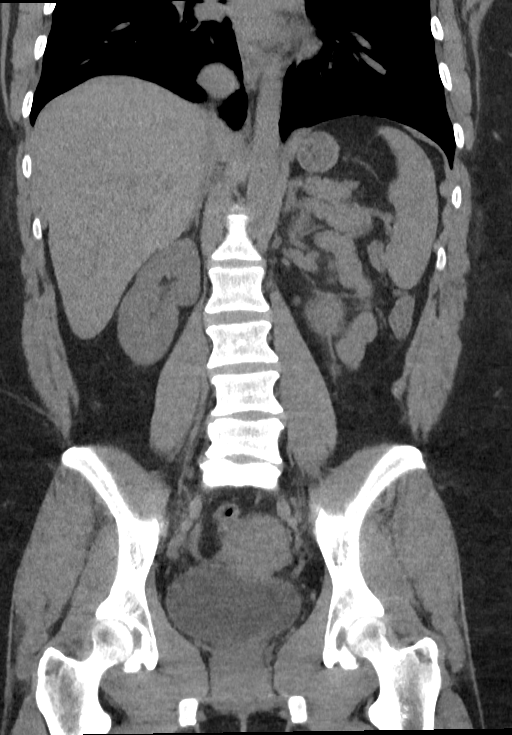

[16 of 46 positions shown; findings below may reference images not displayed]

FINDINGS: Lower chest: No acute abnormality.

Hepatobiliary: No focal liver abnormality is seen. Status post
cholecystectomy. No biliary dilatation.

Pancreas: Unremarkable. No pancreatic ductal dilatation or
surrounding inflammatory changes.

Spleen: Normal in size without focal abnormality.

Adrenals/Urinary Tract: Adrenal glands are unremarkable. Kidneys are
normal, without renal calculi, focal lesion, or hydronephrosis.
Bladder is unremarkable.

Stomach/Bowel: Stomach is within normal limits. Appendix appears
normal. No evidence of bowel wall thickening, distention, or
inflammatory changes.

Vascular/Lymphatic: No significant vascular findings are present. No
enlarged abdominal or pelvic lymph nodes.

Reproductive: Uterus and bilateral adnexa are unremarkable.

Other: No abdominal wall hernia or abnormality. No abdominopelvic
ascites.

Musculoskeletal: No acute or significant osseous findings.
IMPRESSION: 1. No acute or active process within the abdomen or pelvis.
2. Status post cholecystectomy.

## 2024-02-25 NOTE — Telephone Encounter (Signed)
 Requested Prescriptions  Pending Prescriptions Disp Refills   FLUoxetine  (PROZAC ) 20 MG capsule [Pharmacy Med Name: FLUOXETINE  HCL 20 MG CAPSULE] 90 capsule 1    Sig: TAKE 1 CAPSULE BY MOUTH EVERY DAY     Psychiatry:  Antidepressants - SSRI Passed - 02/25/2024 10:03 AM      Passed - Completed PHQ-2 or PHQ-9 in the last 360 days      Passed - Valid encounter within last 6 months    Recent Outpatient Visits           1 month ago Mild persistent asthma with acute exacerbation   [place] [facility] [place], Angeline ORN, NP   2 months ago Viral URI with cough   [place] [facility] [place], Angeline ORN, NP   4 months ago Anxiety and depression   [place] [place], Angeline ORN, NP   7 months ago Encounter for general adult medical examination with abnormal findings   [place] [facility] [place], Angeline ORN, NP   11 months ago Abscess of axilla, right   [facility] [place], Angeline ORN, [place]

## 2024-03-10 ENCOUNTER — Ambulatory Visit: Admitting: Internal Medicine

## 2024-03-27 ENCOUNTER — Ambulatory Visit: Admitting: Pulmonary Disease

## 2024-07-23 ENCOUNTER — Encounter: Admitting: Internal Medicine
